# Patient Record
Sex: Female | Born: 1960 | Race: Black or African American | Hispanic: No | Marital: Single | Smoking: Former smoker
Health system: Southern US, Community
[De-identification: ages and names within clinical notes are randomized; demographics above are authoritative.]

## PROBLEM LIST (undated history)

## (undated) DIAGNOSIS — R011 Cardiac murmur, unspecified: Secondary | ICD-10-CM

## (undated) DIAGNOSIS — I499 Cardiac arrhythmia, unspecified: Secondary | ICD-10-CM

## (undated) DIAGNOSIS — K219 Gastro-esophageal reflux disease without esophagitis: Secondary | ICD-10-CM

## (undated) DIAGNOSIS — R0600 Dyspnea, unspecified: Secondary | ICD-10-CM

## (undated) DIAGNOSIS — G4730 Sleep apnea, unspecified: Secondary | ICD-10-CM

## (undated) DIAGNOSIS — M797 Fibromyalgia: Secondary | ICD-10-CM

## (undated) DIAGNOSIS — F419 Anxiety disorder, unspecified: Secondary | ICD-10-CM

## (undated) DIAGNOSIS — I10 Essential (primary) hypertension: Secondary | ICD-10-CM

## (undated) DIAGNOSIS — J383 Other diseases of vocal cords: Secondary | ICD-10-CM

## (undated) DIAGNOSIS — Z9289 Personal history of other medical treatment: Secondary | ICD-10-CM

## (undated) HISTORY — DX: Fibromyalgia: M79.7

## (undated) HISTORY — PX: KNEE SURGERY: SHX244

## (undated) HISTORY — PX: SKIN GRAFT: SHX250

## (undated) HISTORY — DX: Personal history of other medical treatment: Z92.89

## (undated) HISTORY — PX: APPENDECTOMY: SHX54

## (undated) HISTORY — DX: Cardiac arrhythmia, unspecified: I49.9

## (undated) HISTORY — PX: CARPAL TUNNEL RELEASE: SHX101

## (undated) HISTORY — DX: Essential (primary) hypertension: I10

## (undated) HISTORY — DX: Sleep apnea, unspecified: G47.30

## (undated) HISTORY — DX: Gastro-esophageal reflux disease without esophagitis: K21.9

## (undated) HISTORY — DX: Other diseases of vocal cords: J38.3

---

## 1999-01-31 ENCOUNTER — Emergency Department (HOSPITAL_COMMUNITY): Admission: EM | Admit: 1999-01-31 | Discharge: 1999-01-31 | Payer: Self-pay | Admitting: Emergency Medicine

## 1999-02-09 ENCOUNTER — Ambulatory Visit (HOSPITAL_COMMUNITY): Admission: RE | Admit: 1999-02-09 | Discharge: 1999-02-09 | Payer: Self-pay | Admitting: Family Medicine

## 1999-02-09 ENCOUNTER — Encounter: Payer: Self-pay | Admitting: Family Medicine

## 1999-08-11 ENCOUNTER — Emergency Department (HOSPITAL_COMMUNITY): Admission: EM | Admit: 1999-08-11 | Discharge: 1999-08-11 | Payer: Self-pay | Admitting: Emergency Medicine

## 2000-04-14 ENCOUNTER — Emergency Department (HOSPITAL_COMMUNITY): Admission: EM | Admit: 2000-04-14 | Discharge: 2000-04-14 | Payer: Self-pay | Admitting: Emergency Medicine

## 2001-01-17 ENCOUNTER — Emergency Department (HOSPITAL_COMMUNITY): Admission: EM | Admit: 2001-01-17 | Discharge: 2001-01-17 | Payer: Self-pay | Admitting: Internal Medicine

## 2001-04-23 ENCOUNTER — Inpatient Hospital Stay (HOSPITAL_COMMUNITY): Admission: EM | Admit: 2001-04-23 | Discharge: 2001-04-25 | Payer: Self-pay | Admitting: Emergency Medicine

## 2001-04-25 ENCOUNTER — Encounter: Payer: Self-pay | Admitting: Family Medicine

## 2001-07-05 HISTORY — PX: SHOULDER SURGERY: SHX246

## 2001-09-18 ENCOUNTER — Encounter: Admission: RE | Admit: 2001-09-18 | Discharge: 2001-09-18 | Payer: Self-pay | Admitting: Family Medicine

## 2001-09-18 ENCOUNTER — Encounter: Payer: Self-pay | Admitting: Family Medicine

## 2001-09-25 ENCOUNTER — Emergency Department (HOSPITAL_COMMUNITY): Admission: EM | Admit: 2001-09-25 | Discharge: 2001-09-25 | Payer: Self-pay | Admitting: Emergency Medicine

## 2002-05-03 ENCOUNTER — Emergency Department (HOSPITAL_COMMUNITY): Admission: EM | Admit: 2002-05-03 | Discharge: 2002-05-03 | Payer: Self-pay | Admitting: Emergency Medicine

## 2002-05-03 ENCOUNTER — Ambulatory Visit: Admission: RE | Admit: 2002-05-03 | Discharge: 2002-05-03 | Payer: Self-pay | Admitting: Family Medicine

## 2002-07-27 ENCOUNTER — Encounter: Admission: RE | Admit: 2002-07-27 | Discharge: 2002-09-07 | Payer: Self-pay | Admitting: Orthopedic Surgery

## 2002-09-18 ENCOUNTER — Encounter: Admission: RE | Admit: 2002-09-18 | Discharge: 2002-10-05 | Payer: Self-pay | Admitting: Orthopedic Surgery

## 2002-10-26 ENCOUNTER — Other Ambulatory Visit: Admission: RE | Admit: 2002-10-26 | Discharge: 2002-10-26 | Payer: Self-pay | Admitting: Family Medicine

## 2003-01-27 ENCOUNTER — Emergency Department (HOSPITAL_COMMUNITY): Admission: EM | Admit: 2003-01-27 | Discharge: 2003-01-27 | Payer: Self-pay | Admitting: Emergency Medicine

## 2004-03-20 ENCOUNTER — Encounter: Admission: RE | Admit: 2004-03-20 | Discharge: 2004-06-18 | Payer: Self-pay | Admitting: Orthopedic Surgery

## 2004-06-17 ENCOUNTER — Emergency Department (HOSPITAL_COMMUNITY): Admission: EM | Admit: 2004-06-17 | Discharge: 2004-06-17 | Payer: Self-pay | Admitting: Emergency Medicine

## 2004-10-07 ENCOUNTER — Encounter: Admission: RE | Admit: 2004-10-07 | Discharge: 2004-10-07 | Payer: Self-pay | Admitting: Family Medicine

## 2005-03-19 ENCOUNTER — Ambulatory Visit (HOSPITAL_BASED_OUTPATIENT_CLINIC_OR_DEPARTMENT_OTHER): Admission: RE | Admit: 2005-03-19 | Discharge: 2005-03-19 | Payer: Self-pay | Admitting: Orthopedic Surgery

## 2005-03-19 ENCOUNTER — Ambulatory Visit (HOSPITAL_COMMUNITY): Admission: RE | Admit: 2005-03-19 | Discharge: 2005-03-19 | Payer: Self-pay | Admitting: Orthopedic Surgery

## 2005-04-07 ENCOUNTER — Encounter: Admission: RE | Admit: 2005-04-07 | Discharge: 2005-07-06 | Payer: Self-pay | Admitting: Orthopedic Surgery

## 2005-08-17 ENCOUNTER — Encounter: Admission: RE | Admit: 2005-08-17 | Discharge: 2005-11-15 | Payer: Self-pay | Admitting: Orthopedic Surgery

## 2005-11-25 ENCOUNTER — Encounter: Admission: RE | Admit: 2005-11-25 | Discharge: 2006-02-23 | Payer: Self-pay | Admitting: Orthopedic Surgery

## 2006-05-15 ENCOUNTER — Ambulatory Visit (HOSPITAL_COMMUNITY): Admission: RE | Admit: 2006-05-15 | Discharge: 2006-05-15 | Payer: Self-pay | Admitting: Orthopedic Surgery

## 2006-06-17 ENCOUNTER — Ambulatory Visit (HOSPITAL_COMMUNITY): Admission: RE | Admit: 2006-06-17 | Discharge: 2006-06-17 | Payer: Self-pay | Admitting: Orthopedic Surgery

## 2006-06-17 ENCOUNTER — Encounter: Payer: Self-pay | Admitting: Vascular Surgery

## 2006-12-02 ENCOUNTER — Encounter: Admission: RE | Admit: 2006-12-02 | Discharge: 2006-12-02 | Payer: Self-pay | Admitting: Rheumatology

## 2006-12-06 ENCOUNTER — Ambulatory Visit: Payer: Self-pay | Admitting: Pulmonary Disease

## 2007-01-09 ENCOUNTER — Ambulatory Visit: Payer: Self-pay | Admitting: Physical Medicine & Rehabilitation

## 2007-01-09 ENCOUNTER — Encounter
Admission: RE | Admit: 2007-01-09 | Discharge: 2007-04-09 | Payer: Self-pay | Admitting: Physical Medicine & Rehabilitation

## 2007-01-18 ENCOUNTER — Encounter: Admission: RE | Admit: 2007-01-18 | Discharge: 2007-01-18 | Payer: Self-pay | Admitting: Family Medicine

## 2007-02-02 ENCOUNTER — Encounter
Admission: RE | Admit: 2007-02-02 | Discharge: 2007-05-03 | Payer: Self-pay | Admitting: Physical Medicine & Rehabilitation

## 2007-03-03 ENCOUNTER — Ambulatory Visit: Payer: Self-pay | Admitting: Physical Medicine & Rehabilitation

## 2007-05-04 ENCOUNTER — Ambulatory Visit: Payer: Self-pay | Admitting: Psychology

## 2007-05-04 ENCOUNTER — Ambulatory Visit: Payer: Self-pay | Admitting: Physical Medicine & Rehabilitation

## 2007-05-04 ENCOUNTER — Encounter
Admission: RE | Admit: 2007-05-04 | Discharge: 2007-08-02 | Payer: Self-pay | Admitting: Physical Medicine & Rehabilitation

## 2007-06-08 ENCOUNTER — Ambulatory Visit: Payer: Self-pay | Admitting: Physical Medicine & Rehabilitation

## 2007-07-03 ENCOUNTER — Encounter
Admission: RE | Admit: 2007-07-03 | Discharge: 2007-07-04 | Payer: Self-pay | Admitting: Physical Medicine & Rehabilitation

## 2007-07-06 ENCOUNTER — Encounter
Admission: RE | Admit: 2007-07-06 | Discharge: 2007-08-14 | Payer: Self-pay | Admitting: Physical Medicine & Rehabilitation

## 2007-07-10 ENCOUNTER — Encounter
Admission: RE | Admit: 2007-07-10 | Discharge: 2007-09-14 | Payer: Self-pay | Admitting: Physical Medicine & Rehabilitation

## 2007-07-10 ENCOUNTER — Ambulatory Visit: Payer: Self-pay | Admitting: Physical Medicine & Rehabilitation

## 2007-07-29 ENCOUNTER — Encounter
Admission: RE | Admit: 2007-07-29 | Discharge: 2007-07-29 | Payer: Self-pay | Admitting: Physical Medicine & Rehabilitation

## 2007-07-31 DIAGNOSIS — R51 Headache: Secondary | ICD-10-CM | POA: Insufficient documentation

## 2007-07-31 DIAGNOSIS — I10 Essential (primary) hypertension: Secondary | ICD-10-CM | POA: Insufficient documentation

## 2007-07-31 DIAGNOSIS — R519 Headache, unspecified: Secondary | ICD-10-CM | POA: Insufficient documentation

## 2007-07-31 DIAGNOSIS — I517 Cardiomegaly: Secondary | ICD-10-CM | POA: Insufficient documentation

## 2007-07-31 DIAGNOSIS — J4540 Moderate persistent asthma, uncomplicated: Secondary | ICD-10-CM | POA: Insufficient documentation

## 2007-08-09 ENCOUNTER — Ambulatory Visit: Payer: Self-pay | Admitting: Physical Medicine & Rehabilitation

## 2007-08-09 ENCOUNTER — Encounter
Admission: RE | Admit: 2007-08-09 | Discharge: 2007-11-07 | Payer: Self-pay | Admitting: Physical Medicine & Rehabilitation

## 2007-08-23 ENCOUNTER — Ambulatory Visit: Payer: Self-pay | Admitting: Pulmonary Disease

## 2007-08-23 DIAGNOSIS — J383 Other diseases of vocal cords: Secondary | ICD-10-CM | POA: Insufficient documentation

## 2007-08-23 DIAGNOSIS — R0602 Shortness of breath: Secondary | ICD-10-CM | POA: Insufficient documentation

## 2007-09-11 ENCOUNTER — Ambulatory Visit: Payer: Self-pay | Admitting: Physical Medicine & Rehabilitation

## 2007-09-26 ENCOUNTER — Ambulatory Visit: Payer: Self-pay | Admitting: Physical Medicine & Rehabilitation

## 2007-09-26 ENCOUNTER — Encounter
Admission: RE | Admit: 2007-09-26 | Discharge: 2007-09-28 | Payer: Self-pay | Admitting: Physical Medicine & Rehabilitation

## 2007-11-20 ENCOUNTER — Encounter
Admission: RE | Admit: 2007-11-20 | Discharge: 2007-12-20 | Payer: Self-pay | Admitting: Physical Medicine & Rehabilitation

## 2007-11-20 ENCOUNTER — Ambulatory Visit: Payer: Self-pay | Admitting: Physical Medicine & Rehabilitation

## 2007-12-19 ENCOUNTER — Encounter
Admission: RE | Admit: 2007-12-19 | Discharge: 2007-12-19 | Payer: Self-pay | Admitting: Physical Medicine & Rehabilitation

## 2007-12-20 ENCOUNTER — Ambulatory Visit: Payer: Self-pay | Admitting: Physical Medicine & Rehabilitation

## 2007-12-27 ENCOUNTER — Encounter: Payer: Self-pay | Admitting: Internal Medicine

## 2008-01-09 ENCOUNTER — Emergency Department (HOSPITAL_COMMUNITY): Admission: EM | Admit: 2008-01-09 | Discharge: 2008-01-09 | Payer: Self-pay | Admitting: Family Medicine

## 2008-01-22 ENCOUNTER — Emergency Department (HOSPITAL_COMMUNITY): Admission: EM | Admit: 2008-01-22 | Discharge: 2008-01-22 | Payer: Self-pay | Admitting: Emergency Medicine

## 2008-02-14 ENCOUNTER — Telehealth (INDEPENDENT_AMBULATORY_CARE_PROVIDER_SITE_OTHER): Payer: Self-pay | Admitting: *Deleted

## 2008-03-01 ENCOUNTER — Encounter
Admission: RE | Admit: 2008-03-01 | Discharge: 2008-03-04 | Payer: Self-pay | Admitting: Physical Medicine & Rehabilitation

## 2008-03-04 ENCOUNTER — Ambulatory Visit: Payer: Self-pay | Admitting: Physical Medicine & Rehabilitation

## 2008-03-27 ENCOUNTER — Encounter: Payer: Self-pay | Admitting: Pulmonary Disease

## 2008-04-01 ENCOUNTER — Telehealth (INDEPENDENT_AMBULATORY_CARE_PROVIDER_SITE_OTHER): Payer: Self-pay | Admitting: *Deleted

## 2008-04-03 ENCOUNTER — Ambulatory Visit: Payer: Self-pay | Admitting: Pulmonary Disease

## 2008-04-16 ENCOUNTER — Emergency Department (HOSPITAL_COMMUNITY): Admission: EM | Admit: 2008-04-16 | Discharge: 2008-04-16 | Payer: Self-pay | Admitting: Emergency Medicine

## 2008-06-03 ENCOUNTER — Ambulatory Visit: Payer: Self-pay | Admitting: Gastroenterology

## 2008-06-03 DIAGNOSIS — K5900 Constipation, unspecified: Secondary | ICD-10-CM | POA: Insufficient documentation

## 2008-06-03 DIAGNOSIS — IMO0001 Reserved for inherently not codable concepts without codable children: Secondary | ICD-10-CM | POA: Insufficient documentation

## 2008-06-03 DIAGNOSIS — R1310 Dysphagia, unspecified: Secondary | ICD-10-CM | POA: Insufficient documentation

## 2008-06-07 ENCOUNTER — Encounter: Payer: Self-pay | Admitting: Pulmonary Disease

## 2008-06-11 ENCOUNTER — Ambulatory Visit: Payer: Self-pay | Admitting: Gastroenterology

## 2008-08-06 ENCOUNTER — Telehealth: Payer: Self-pay | Admitting: Gastroenterology

## 2008-08-06 DIAGNOSIS — K222 Esophageal obstruction: Secondary | ICD-10-CM | POA: Insufficient documentation

## 2008-08-08 ENCOUNTER — Ambulatory Visit: Payer: Self-pay | Admitting: Gastroenterology

## 2008-08-08 ENCOUNTER — Telehealth (INDEPENDENT_AMBULATORY_CARE_PROVIDER_SITE_OTHER): Payer: Self-pay | Admitting: *Deleted

## 2008-08-13 ENCOUNTER — Telehealth: Payer: Self-pay | Admitting: Gastroenterology

## 2008-08-16 ENCOUNTER — Ambulatory Visit (HOSPITAL_COMMUNITY): Admission: RE | Admit: 2008-08-16 | Discharge: 2008-08-16 | Payer: Self-pay | Admitting: Gastroenterology

## 2008-08-16 ENCOUNTER — Ambulatory Visit: Payer: Self-pay | Admitting: Gastroenterology

## 2008-09-02 ENCOUNTER — Encounter: Payer: Self-pay | Admitting: Pulmonary Disease

## 2008-09-10 ENCOUNTER — Telehealth (INDEPENDENT_AMBULATORY_CARE_PROVIDER_SITE_OTHER): Payer: Self-pay | Admitting: *Deleted

## 2008-09-11 ENCOUNTER — Encounter: Payer: Self-pay | Admitting: Pulmonary Disease

## 2008-09-12 ENCOUNTER — Emergency Department (HOSPITAL_COMMUNITY): Admission: EM | Admit: 2008-09-12 | Discharge: 2008-09-13 | Payer: Self-pay | Admitting: Family Medicine

## 2008-09-30 ENCOUNTER — Ambulatory Visit: Payer: Self-pay | Admitting: Internal Medicine

## 2008-09-30 DIAGNOSIS — J309 Allergic rhinitis, unspecified: Secondary | ICD-10-CM | POA: Insufficient documentation

## 2008-09-30 LAB — CONVERTED CEMR LAB
Basophils Absolute: 0 10*3/uL (ref 0.0–0.1)
Eosinophils Absolute: 0.2 10*3/uL (ref 0.0–0.7)
HCT: 33.3 % — ABNORMAL LOW (ref 36.0–46.0)
IgE (Immunoglobulin E), Serum: 52.5 intl units/mL (ref 0.0–180.0)
Lymphocytes Relative: 36.7 % (ref 12.0–46.0)
Lymphs Abs: 5.2 10*3/uL — ABNORMAL HIGH (ref 0.7–4.0)
Monocytes Relative: 3.3 % (ref 3.0–12.0)
Neutro Abs: 8.3 10*3/uL — ABNORMAL HIGH (ref 1.4–7.7)
Platelets: 319 10*3/uL (ref 150.0–400.0)
RBC: 4.07 M/uL (ref 3.87–5.11)
WBC: 14.2 10*3/uL — ABNORMAL HIGH (ref 4.5–10.5)

## 2008-11-08 ENCOUNTER — Ambulatory Visit: Payer: Self-pay | Admitting: Internal Medicine

## 2008-11-08 ENCOUNTER — Telehealth (INDEPENDENT_AMBULATORY_CARE_PROVIDER_SITE_OTHER): Payer: Self-pay | Admitting: *Deleted

## 2008-11-08 ENCOUNTER — Encounter (INDEPENDENT_AMBULATORY_CARE_PROVIDER_SITE_OTHER): Payer: Self-pay | Admitting: *Deleted

## 2008-11-12 ENCOUNTER — Telehealth: Payer: Self-pay | Admitting: Internal Medicine

## 2008-11-17 ENCOUNTER — Encounter: Payer: Self-pay | Admitting: Internal Medicine

## 2008-11-17 DIAGNOSIS — K219 Gastro-esophageal reflux disease without esophagitis: Secondary | ICD-10-CM | POA: Insufficient documentation

## 2008-11-18 ENCOUNTER — Ambulatory Visit: Payer: Self-pay | Admitting: Internal Medicine

## 2008-11-19 ENCOUNTER — Ambulatory Visit: Payer: Self-pay | Admitting: Internal Medicine

## 2008-11-25 ENCOUNTER — Ambulatory Visit: Payer: Self-pay | Admitting: Internal Medicine

## 2008-11-27 ENCOUNTER — Telehealth (INDEPENDENT_AMBULATORY_CARE_PROVIDER_SITE_OTHER): Payer: Self-pay | Admitting: *Deleted

## 2008-11-28 ENCOUNTER — Ambulatory Visit: Payer: Self-pay | Admitting: Internal Medicine

## 2008-12-03 ENCOUNTER — Ambulatory Visit: Payer: Self-pay | Admitting: Internal Medicine

## 2008-12-06 ENCOUNTER — Ambulatory Visit: Payer: Self-pay | Admitting: Internal Medicine

## 2008-12-11 ENCOUNTER — Ambulatory Visit: Payer: Self-pay | Admitting: Internal Medicine

## 2008-12-13 ENCOUNTER — Ambulatory Visit: Payer: Self-pay | Admitting: Internal Medicine

## 2008-12-17 ENCOUNTER — Ambulatory Visit: Payer: Self-pay | Admitting: Internal Medicine

## 2008-12-20 ENCOUNTER — Ambulatory Visit: Payer: Self-pay | Admitting: Internal Medicine

## 2008-12-24 ENCOUNTER — Ambulatory Visit: Payer: Self-pay | Admitting: Internal Medicine

## 2008-12-25 ENCOUNTER — Telehealth (INDEPENDENT_AMBULATORY_CARE_PROVIDER_SITE_OTHER): Payer: Self-pay | Admitting: *Deleted

## 2008-12-26 ENCOUNTER — Telehealth: Payer: Self-pay | Admitting: Internal Medicine

## 2008-12-26 ENCOUNTER — Ambulatory Visit: Payer: Self-pay | Admitting: Internal Medicine

## 2008-12-30 ENCOUNTER — Telehealth (INDEPENDENT_AMBULATORY_CARE_PROVIDER_SITE_OTHER): Payer: Self-pay | Admitting: *Deleted

## 2008-12-30 ENCOUNTER — Ambulatory Visit: Payer: Self-pay | Admitting: Internal Medicine

## 2008-12-31 ENCOUNTER — Ambulatory Visit: Payer: Self-pay | Admitting: Gastroenterology

## 2009-01-02 ENCOUNTER — Ambulatory Visit (HOSPITAL_COMMUNITY): Admission: RE | Admit: 2009-01-02 | Discharge: 2009-01-02 | Payer: Self-pay | Admitting: Gastroenterology

## 2009-01-02 ENCOUNTER — Ambulatory Visit: Payer: Self-pay | Admitting: Internal Medicine

## 2009-01-07 ENCOUNTER — Ambulatory Visit: Payer: Self-pay | Admitting: Internal Medicine

## 2009-01-08 ENCOUNTER — Encounter: Payer: Self-pay | Admitting: Gastroenterology

## 2009-01-10 ENCOUNTER — Ambulatory Visit: Payer: Self-pay | Admitting: Internal Medicine

## 2009-01-14 ENCOUNTER — Ambulatory Visit: Payer: Self-pay | Admitting: Internal Medicine

## 2009-01-17 ENCOUNTER — Ambulatory Visit: Payer: Self-pay | Admitting: Internal Medicine

## 2009-01-22 ENCOUNTER — Ambulatory Visit: Payer: Self-pay | Admitting: Internal Medicine

## 2009-01-24 ENCOUNTER — Ambulatory Visit: Payer: Self-pay | Admitting: Internal Medicine

## 2009-01-27 ENCOUNTER — Ambulatory Visit: Payer: Self-pay | Admitting: Internal Medicine

## 2009-01-27 ENCOUNTER — Telehealth: Payer: Self-pay | Admitting: Internal Medicine

## 2009-01-28 ENCOUNTER — Ambulatory Visit: Payer: Self-pay | Admitting: Internal Medicine

## 2009-01-31 ENCOUNTER — Ambulatory Visit: Payer: Self-pay | Admitting: Internal Medicine

## 2009-02-04 ENCOUNTER — Ambulatory Visit: Payer: Self-pay | Admitting: Internal Medicine

## 2009-02-07 ENCOUNTER — Ambulatory Visit: Payer: Self-pay | Admitting: Internal Medicine

## 2009-02-10 ENCOUNTER — Ambulatory Visit (HOSPITAL_COMMUNITY): Admission: RE | Admit: 2009-02-10 | Discharge: 2009-02-10 | Payer: Self-pay | Admitting: Gastroenterology

## 2009-02-10 ENCOUNTER — Ambulatory Visit: Payer: Self-pay | Admitting: Gastroenterology

## 2009-02-12 ENCOUNTER — Ambulatory Visit: Payer: Self-pay | Admitting: Internal Medicine

## 2009-02-14 ENCOUNTER — Telehealth (INDEPENDENT_AMBULATORY_CARE_PROVIDER_SITE_OTHER): Payer: Self-pay | Admitting: *Deleted

## 2009-02-19 ENCOUNTER — Ambulatory Visit: Payer: Self-pay | Admitting: Internal Medicine

## 2009-02-20 ENCOUNTER — Telehealth: Payer: Self-pay | Admitting: Gastroenterology

## 2009-02-21 ENCOUNTER — Ambulatory Visit: Payer: Self-pay | Admitting: Internal Medicine

## 2009-02-24 ENCOUNTER — Ambulatory Visit: Payer: Self-pay | Admitting: Internal Medicine

## 2009-02-28 ENCOUNTER — Ambulatory Visit: Payer: Self-pay | Admitting: Internal Medicine

## 2009-03-03 ENCOUNTER — Ambulatory Visit: Payer: Self-pay | Admitting: Internal Medicine

## 2009-03-07 ENCOUNTER — Ambulatory Visit: Payer: Self-pay | Admitting: Internal Medicine

## 2009-03-13 ENCOUNTER — Encounter: Admission: RE | Admit: 2009-03-13 | Discharge: 2009-03-13 | Payer: Self-pay | Admitting: Orthopedic Surgery

## 2009-03-14 ENCOUNTER — Ambulatory Visit: Payer: Self-pay | Admitting: Internal Medicine

## 2009-03-18 ENCOUNTER — Telehealth (INDEPENDENT_AMBULATORY_CARE_PROVIDER_SITE_OTHER): Payer: Self-pay | Admitting: *Deleted

## 2009-03-21 ENCOUNTER — Ambulatory Visit: Payer: Self-pay | Admitting: Internal Medicine

## 2009-03-25 ENCOUNTER — Ambulatory Visit: Payer: Self-pay | Admitting: Internal Medicine

## 2009-03-27 ENCOUNTER — Telehealth: Payer: Self-pay | Admitting: Internal Medicine

## 2009-03-27 ENCOUNTER — Ambulatory Visit: Payer: Self-pay | Admitting: Internal Medicine

## 2009-03-27 ENCOUNTER — Encounter (INDEPENDENT_AMBULATORY_CARE_PROVIDER_SITE_OTHER): Payer: Self-pay | Admitting: *Deleted

## 2009-03-27 ENCOUNTER — Encounter: Payer: Self-pay | Admitting: Internal Medicine

## 2009-03-28 ENCOUNTER — Telehealth: Payer: Self-pay | Admitting: Internal Medicine

## 2009-04-01 ENCOUNTER — Ambulatory Visit: Payer: Self-pay | Admitting: Internal Medicine

## 2009-04-04 ENCOUNTER — Ambulatory Visit: Payer: Self-pay | Admitting: Internal Medicine

## 2009-04-04 ENCOUNTER — Telehealth (INDEPENDENT_AMBULATORY_CARE_PROVIDER_SITE_OTHER): Payer: Self-pay | Admitting: *Deleted

## 2009-04-10 ENCOUNTER — Ambulatory Visit: Payer: Self-pay | Admitting: Internal Medicine

## 2009-04-16 ENCOUNTER — Ambulatory Visit: Payer: Self-pay | Admitting: Internal Medicine

## 2009-04-17 ENCOUNTER — Ambulatory Visit: Payer: Self-pay | Admitting: Internal Medicine

## 2009-04-18 ENCOUNTER — Ambulatory Visit: Payer: Self-pay | Admitting: Internal Medicine

## 2009-04-21 ENCOUNTER — Telehealth: Payer: Self-pay | Admitting: Internal Medicine

## 2009-04-22 ENCOUNTER — Ambulatory Visit: Payer: Self-pay | Admitting: Internal Medicine

## 2009-04-24 ENCOUNTER — Telehealth (INDEPENDENT_AMBULATORY_CARE_PROVIDER_SITE_OTHER): Payer: Self-pay | Admitting: *Deleted

## 2009-04-24 ENCOUNTER — Ambulatory Visit: Payer: Self-pay | Admitting: Internal Medicine

## 2009-05-01 ENCOUNTER — Ambulatory Visit: Payer: Self-pay | Admitting: Internal Medicine

## 2009-05-06 ENCOUNTER — Encounter: Admission: RE | Admit: 2009-05-06 | Discharge: 2009-05-06 | Payer: Self-pay | Admitting: Orthopedic Surgery

## 2009-05-07 ENCOUNTER — Encounter: Admission: RE | Admit: 2009-05-07 | Discharge: 2009-05-07 | Payer: Self-pay | Admitting: Orthopedic Surgery

## 2009-05-09 ENCOUNTER — Ambulatory Visit: Payer: Self-pay | Admitting: Internal Medicine

## 2009-05-21 ENCOUNTER — Emergency Department (HOSPITAL_COMMUNITY): Admission: EM | Admit: 2009-05-21 | Discharge: 2009-05-21 | Payer: Self-pay | Admitting: Emergency Medicine

## 2009-05-22 ENCOUNTER — Ambulatory Visit: Payer: Self-pay | Admitting: Internal Medicine

## 2009-05-27 ENCOUNTER — Encounter: Admission: RE | Admit: 2009-05-27 | Discharge: 2009-05-27 | Payer: Self-pay | Admitting: Orthopedic Surgery

## 2009-06-05 ENCOUNTER — Telehealth (INDEPENDENT_AMBULATORY_CARE_PROVIDER_SITE_OTHER): Payer: Self-pay | Admitting: *Deleted

## 2009-06-05 ENCOUNTER — Ambulatory Visit: Payer: Self-pay | Admitting: Internal Medicine

## 2009-06-06 ENCOUNTER — Telehealth (INDEPENDENT_AMBULATORY_CARE_PROVIDER_SITE_OTHER): Payer: Self-pay | Admitting: *Deleted

## 2009-06-20 ENCOUNTER — Ambulatory Visit: Payer: Self-pay | Admitting: Internal Medicine

## 2009-06-30 ENCOUNTER — Ambulatory Visit: Payer: Self-pay | Admitting: Internal Medicine

## 2009-06-30 ENCOUNTER — Telehealth (INDEPENDENT_AMBULATORY_CARE_PROVIDER_SITE_OTHER): Payer: Self-pay | Admitting: *Deleted

## 2009-07-03 ENCOUNTER — Encounter: Payer: Self-pay | Admitting: Internal Medicine

## 2009-07-07 ENCOUNTER — Ambulatory Visit: Payer: Self-pay | Admitting: Internal Medicine

## 2009-07-16 ENCOUNTER — Ambulatory Visit: Payer: Self-pay | Admitting: Internal Medicine

## 2009-07-16 ENCOUNTER — Telehealth: Payer: Self-pay | Admitting: Internal Medicine

## 2009-08-01 ENCOUNTER — Telehealth (INDEPENDENT_AMBULATORY_CARE_PROVIDER_SITE_OTHER): Payer: Self-pay | Admitting: *Deleted

## 2009-08-04 ENCOUNTER — Inpatient Hospital Stay (HOSPITAL_COMMUNITY): Admission: RE | Admit: 2009-08-04 | Discharge: 2009-08-06 | Payer: Self-pay | Admitting: Orthopedic Surgery

## 2009-08-20 ENCOUNTER — Telehealth (INDEPENDENT_AMBULATORY_CARE_PROVIDER_SITE_OTHER): Payer: Self-pay | Admitting: *Deleted

## 2009-08-21 ENCOUNTER — Ambulatory Visit: Payer: Self-pay | Admitting: Internal Medicine

## 2009-08-22 ENCOUNTER — Ambulatory Visit: Payer: Self-pay | Admitting: Internal Medicine

## 2009-08-29 ENCOUNTER — Ambulatory Visit: Payer: Self-pay | Admitting: Internal Medicine

## 2009-08-31 ENCOUNTER — Emergency Department (HOSPITAL_COMMUNITY): Admission: EM | Admit: 2009-08-31 | Discharge: 2009-08-31 | Payer: Self-pay | Admitting: Family Medicine

## 2009-09-12 ENCOUNTER — Telehealth (INDEPENDENT_AMBULATORY_CARE_PROVIDER_SITE_OTHER): Payer: Self-pay | Admitting: *Deleted

## 2009-09-12 ENCOUNTER — Ambulatory Visit: Payer: Self-pay | Admitting: Internal Medicine

## 2009-09-16 ENCOUNTER — Encounter: Admission: RE | Admit: 2009-09-16 | Discharge: 2009-11-24 | Payer: Self-pay | Admitting: Orthopedic Surgery

## 2009-09-17 ENCOUNTER — Telehealth: Payer: Self-pay | Admitting: Internal Medicine

## 2009-09-18 ENCOUNTER — Encounter: Payer: Self-pay | Admitting: Internal Medicine

## 2009-09-19 ENCOUNTER — Ambulatory Visit: Payer: Self-pay | Admitting: Internal Medicine

## 2009-10-02 ENCOUNTER — Telehealth (INDEPENDENT_AMBULATORY_CARE_PROVIDER_SITE_OTHER): Payer: Self-pay | Admitting: *Deleted

## 2009-10-03 ENCOUNTER — Ambulatory Visit: Payer: Self-pay | Admitting: Internal Medicine

## 2009-10-17 ENCOUNTER — Ambulatory Visit: Payer: Self-pay | Admitting: Internal Medicine

## 2009-10-27 ENCOUNTER — Encounter: Admission: RE | Admit: 2009-10-27 | Discharge: 2009-11-24 | Payer: Self-pay | Admitting: Orthopedic Surgery

## 2009-10-31 ENCOUNTER — Ambulatory Visit: Payer: Self-pay | Admitting: Internal Medicine

## 2009-11-07 ENCOUNTER — Ambulatory Visit: Payer: Self-pay | Admitting: Internal Medicine

## 2009-11-14 ENCOUNTER — Ambulatory Visit: Payer: Self-pay | Admitting: Internal Medicine

## 2009-11-18 ENCOUNTER — Ambulatory Visit: Payer: Self-pay | Admitting: Internal Medicine

## 2009-11-28 ENCOUNTER — Ambulatory Visit: Payer: Self-pay | Admitting: Internal Medicine

## 2009-12-08 ENCOUNTER — Ambulatory Visit: Payer: Self-pay | Admitting: Internal Medicine

## 2009-12-12 ENCOUNTER — Ambulatory Visit: Payer: Self-pay | Admitting: Internal Medicine

## 2009-12-15 ENCOUNTER — Telehealth (INDEPENDENT_AMBULATORY_CARE_PROVIDER_SITE_OTHER): Payer: Self-pay | Admitting: *Deleted

## 2009-12-19 ENCOUNTER — Ambulatory Visit: Payer: Self-pay | Admitting: Internal Medicine

## 2009-12-25 ENCOUNTER — Telehealth (INDEPENDENT_AMBULATORY_CARE_PROVIDER_SITE_OTHER): Payer: Self-pay | Admitting: *Deleted

## 2009-12-26 ENCOUNTER — Ambulatory Visit: Payer: Self-pay | Admitting: Internal Medicine

## 2010-01-02 ENCOUNTER — Ambulatory Visit: Payer: Self-pay | Admitting: Internal Medicine

## 2010-01-09 ENCOUNTER — Ambulatory Visit: Payer: Self-pay | Admitting: Internal Medicine

## 2010-01-23 ENCOUNTER — Ambulatory Visit: Payer: Self-pay | Admitting: Internal Medicine

## 2010-01-27 ENCOUNTER — Ambulatory Visit: Payer: Self-pay | Admitting: Internal Medicine

## 2010-02-04 ENCOUNTER — Encounter: Admission: RE | Admit: 2010-02-04 | Discharge: 2010-02-23 | Payer: Self-pay | Admitting: Orthopedic Surgery

## 2010-02-05 ENCOUNTER — Telehealth (INDEPENDENT_AMBULATORY_CARE_PROVIDER_SITE_OTHER): Payer: Self-pay | Admitting: *Deleted

## 2010-02-06 ENCOUNTER — Ambulatory Visit: Payer: Self-pay | Admitting: Internal Medicine

## 2010-02-13 ENCOUNTER — Ambulatory Visit: Payer: Self-pay | Admitting: Internal Medicine

## 2010-02-14 ENCOUNTER — Encounter: Admission: RE | Admit: 2010-02-14 | Discharge: 2010-02-14 | Payer: Self-pay | Admitting: Orthopedic Surgery

## 2010-02-20 ENCOUNTER — Emergency Department (HOSPITAL_COMMUNITY): Admission: EM | Admit: 2010-02-20 | Discharge: 2010-02-20 | Payer: Self-pay | Admitting: Emergency Medicine

## 2010-03-06 ENCOUNTER — Ambulatory Visit: Payer: Self-pay | Admitting: Internal Medicine

## 2010-03-13 ENCOUNTER — Ambulatory Visit: Payer: Self-pay | Admitting: Internal Medicine

## 2010-03-20 ENCOUNTER — Ambulatory Visit: Payer: Self-pay | Admitting: Internal Medicine

## 2010-03-27 ENCOUNTER — Ambulatory Visit: Payer: Self-pay | Admitting: Internal Medicine

## 2010-04-03 ENCOUNTER — Ambulatory Visit: Payer: Self-pay | Admitting: Internal Medicine

## 2010-04-17 ENCOUNTER — Ambulatory Visit: Payer: Self-pay | Admitting: Internal Medicine

## 2010-04-24 ENCOUNTER — Ambulatory Visit: Payer: Self-pay | Admitting: Internal Medicine

## 2010-04-28 ENCOUNTER — Telehealth: Payer: Self-pay | Admitting: Internal Medicine

## 2010-05-14 ENCOUNTER — Ambulatory Visit: Payer: Self-pay | Admitting: Internal Medicine

## 2010-05-22 ENCOUNTER — Ambulatory Visit: Payer: Self-pay | Admitting: Internal Medicine

## 2010-05-27 ENCOUNTER — Ambulatory Visit: Payer: Self-pay | Admitting: Internal Medicine

## 2010-06-19 ENCOUNTER — Ambulatory Visit: Payer: Self-pay | Admitting: Internal Medicine

## 2010-06-23 ENCOUNTER — Ambulatory Visit: Payer: Self-pay | Admitting: Internal Medicine

## 2010-07-20 ENCOUNTER — Ambulatory Visit: Payer: Self-pay | Admitting: Internal Medicine

## 2010-07-26 ENCOUNTER — Encounter: Payer: Self-pay | Admitting: Orthopedic Surgery

## 2010-07-26 ENCOUNTER — Encounter: Payer: Self-pay | Admitting: Physical Medicine & Rehabilitation

## 2010-08-04 NOTE — Miscellaneous (Signed)
Summary: Injection Record/[facility]  Injection Record/[facility]   Imported By: Sherian Rein 11/25/2009 09:05:46  _____________________________________________________________________  External Attachment:    Type:   Image     Comment:   External Document

## 2010-08-04 NOTE — Progress Notes (Signed)
Summary: refills  Phone Note Call from Patient Call back at [phone]   Caller: Patient Call For: young Summary of Call: refills: singulair 10 mg, ventolin hfa 108, and prednisone 10mg , pt said when she went to [facility] pharmacy had no record of these been [facility]ill. [facility] Initial call taken by: Darletta Moll,  September 17, 2009 11:00 AM  Follow-up for Phone Call        [facility] not [facility] so I called in singulair, ventolin and prednisone as listed in prev phone note. pt aware rx are now at the pharmacy.Carron Curie CMA  September 17, 2009 11:19 AM      Appended Document: refills received prior auth for the singulair 10mg  daily---called to get this approved through [facility]  [phone] and this med has been approved from 09-18-2009 until 09-18-2010.  [facility] [address] [facility]

## 2010-08-04 NOTE — Progress Notes (Signed)
Summary: rx-lmtcb x 2  Phone Note Call from Patient Call back at [phone]   Caller: Patient Call For: young Reason for Call: Talk to Nurse Summary of Call: can't keep asthma under control - walking, cleaning she gets sob - out of singulair and Ventolin.  Will you call her in rx for xanex for anxiety, doesn't know where her pcp is. [facility] [address]  Initial call taken by: Eugene Gavia,  September 12, 2009 10:53 AM  Follow-up for Phone Call        [facility].Carron Curie CMA  September 12, 2009 12:17 PM ATC home number, no answer, no voicemail.Carron Curie CMA  September 15, 2009 10:21 AM  spoke with pt and she c/o increased SOB with activity and dry cough x 3 weeks. Pt states she only feels better after a neb treatment.  She has been out of singulair and ventolin and request refill on these meds. I have sent refill for ventolin. Singulair is not on pt med list. Please advise if ok to send rx for singulair as well as any other recs. Carron Curie CMA  September 15, 2009 10:36 AM allergies: lyrica, carisopidol   Additional Follow-up for Phone Call Additional follow up Details #1::        I added Singulair back to her list Would also offer prednisone 10 mg, # 20 1 tab four times daily x 2 days, 3 times daily x 2 days, 2 times daily x 2 days, 1 time daily x 2 days  Additional Follow-up by: Waymon Budge MD,  September 15, 2009 12:46 PM    Additional Follow-up for Phone Call Additional follow up Details #2::    Rxs for prednisone, singulair, and ventolin sent to pharm.  LMOMTCB Vernie Murders  September 15, 2009 1:38 PM  [facility] Informing pt rx sent to pharmacy.  Aundra Millet Reynolds LPN  September 16, 2009 3:52 PM    New/Updated Medications: SINGULAIR 10 MG TABS (MONTELUKAST SODIUM) 1 daily for asthma PREDNISONE 10 MG TABS (PREDNISONE) 1 four times a day x 2 days, 1 three times a day x 2 days, 1 two times a day x 2 days, and 1 once daily x 2 days Prescriptions: VENTOLIN HFA 108 (90 BASE) MCG/ACT AERS  (ALBUTEROL SULFATE) 2 puffs every 4 hrs as needed for shortness of breath  #1 x 3   Entered by:   Vernie Murders   Authorized by:   Waymon Budge MD   Signed by:   Vernie Murders on 09/15/2009   Method used:   [facility]ically to        [facility] [address] [phone]* (retail)       [address]       [place], [place]  [address]       Ph: [phone] or [phone]       Fax: [phone]   RxID:   [id number] SINGULAIR 10 MG TABS (MONTELUKAST SODIUM) 1 daily for asthma  #30 x 11   Entered by:   Vernie Murders   Authorized by:   Waymon Budge MD   Signed by:   Vernie Murders on 09/15/2009   Method used:   Electronically to        [facility] [address] [phone]* (retail)       [address]       [place], [place]  [address]  Ph: [phone] or [phone]       Fax: [phone]   RxID:   [id number] PREDNISONE 10 MG TABS (PREDNISONE) 1 four times a day x 2 days, 1 three times a day x 2 days, 1 two times a day x 2 days, and 1 once daily x 2 days  #20 x 0   Entered by:   Vernie Murders   Authorized by:   Waymon Budge MD   Signed by:   Vernie Murders on 09/15/2009   Method used:   Electronically to        [facility] [address] [phone]* (retail)       [address]       [place], [place]  [address]       Ph: [phone] or [phone]       Fax: [phone]   RxID:   [id number] SINGULAIR 10 MG TABS (MONTELUKAST SODIUM) 1 daily for asthma  #30 x prn   Entered by:   Waymon Budge MD   Authorized by:   Pulmonary Triage   Signed by:   Waymon Budge MD on 09/15/2009   Method used:   Historical   RxID:   [id number]

## 2010-08-04 NOTE — Progress Notes (Signed)
Summary: returned call-LMTCB  Phone Note Call from Patient   Caller: Patient Call For: young Summary of Call: pt states that she has received "several missed calls" from our office. didn't know what it was regarding. call [phone] Initial call taken by: Tivis Ringer, CNA,  October 02, 2009 12:51 PM  Follow-up for Phone Call        Do not see any recent messages in EMR where we are trying to reach this pt.  Will forward to [place].  Katie, have you or CY been trying to reach this pt?  Gweneth Dimitri RN  October 02, 2009 1:47 PM   Additional Follow-up for Phone Call Additional follow up Details #1::        I have not; please check with allergy.Reynaldo Minium CMA  October 02, 2009 1:50 PM   Will forward to Allergy-pls advise if you have been trying to reach pt.  Gweneth Dimitri RN  October 02, 2009 2:14 PM     Additional Follow-up for Phone Call Additional follow up Details #2::    No we haven't tried to call her either. Follow-up by: Dimas Millin,  October 02, 2009 3:09 PM  Additional Follow-up for Phone Call Additional follow up Details #3:: Details for Additional Follow-up Action Taken: [facility] to advise pt no one called from this office. Carron Curie CMA  October 02, 2009 3:12 PM   [facility] informing pt no one from this office called pt. Aundra Millet Reynolds LPN  October 03, 1189 10:03 AM
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
## 2010-08-04 NOTE — Assessment & Plan Note (Signed)
Summary: flu shot/mh  Nurse Visit   Allergies: 1)  ! * Lyrica 2)  ! * "carisopidol"  Immunizations Administered:  Pneumonia Vaccine:    Vaccine Type: Pneumovax    Site: right deltoid    Mfr: Merck    Dose: 0.5 ml    Route: IM    Given by: Tammy Scott    Exp. Date: 06/19/2010    Lot #: [id number]  Orders Added: 1)  Pneumococcal Vaccine [90732] 2)  Admin 1st Vaccine [90471]  Appended Document: flu shot/mh Pt. came in Fri. needing allergy,pneumonia,& flu shots. I gave her all. shot in her lt.arm & her pneu. in her rt.arm. I ask her before I her gave the pneumonia shot "Does Dr.Young want you to have this? "Yes".I told her we would give her her flu shot next time she comes in. T.D. knows about this.

## 2010-08-04 NOTE — Progress Notes (Signed)
Summary: needs an rx/cb  Phone Note Call from Patient Call back at [phone]   Caller: Patient Call For: young Summary of Call: pt is wanting promethazine for nausea they cant [facility] doctor [facility] [address] Initial call taken by: Lacinda Axon,  December 25, 2009 12:12 PM  Follow-up for Phone Call        pt was just seen by CY on 12-12-2009.  Pt requesting  rx for promethazine for nause which pt states she has had for " a long time."  Pt states the nausea is sometimes brought on by food but other times it just comes and goes.  Pt states she usually gets this filled through Dr. Eugenie Filler but states the pharmacy has been unable to locate Dr. Thea Silversmith. Pt states he has "left the practice and is in some trouble."  Pt wanted to know if Cy will send order for script for promethazine for pt to take in the meantime since she cant get refils through Dr. Thea Silversmith.  Please advise.  Aundra Millet Reynolds LPN  December 25, 2009 1:09 PM   Additional Follow-up for Phone Call Additional follow up Details #1::        No i'm sorry but i can't manage for that problem. Short -trem, she may need to go to an urgent care. For the long term, she needs to find another primary doctor or we can refer to GI. Additional Follow-up by: Waymon Budge MD,  December 25, 2009 1:30 PM    Additional Follow-up for Phone Call Additional follow up Details #2::    Spoke with pt.  Pt informed of above statement per CY-she verbalized understanding, is ok with this, and voiced no futher questions.  Gweneth Dimitri RN  December 25, 2009 1:47 PM

## 2010-08-04 NOTE — Medication Information (Signed)
Summary: Tax adviser   Imported By: Lehman Prom 09/18/2009 16:41:03  _____________________________________________________________________  External Attachment:    Type:   Image     Comment:   External Document

## 2010-08-04 NOTE — Progress Notes (Signed)
Summary: talk to nurse  Phone Note Call from Patient Call back at [phone]   Caller: Patient Call For: young Summary of Call: pt is ready to restart allergy shots pls call and let her know when she need to come Initial call taken by: Rickard Patience,  August 20, 2009 3:21 PM  Follow-up for Phone Call        pt is ready to restart allergy shots. she postponed them due to surgery. please advise. Carron Curie CMA  August 20, 2009 3:25 PM   Additional Follow-up for Phone Call Additional follow up Details #1::        We should probably take her  back to 0.1 ml/vial at 1:5000 to restart and rebuild as tolerated. Additional Follow-up by: Waymon Budge MD,  August 21, 2009 1:17 PM

## 2010-08-04 NOTE — Progress Notes (Signed)
Summary: allergy shots  Phone Note Call from Patient   Caller: Patient Call For: young Summary of Call: pt wants to cancel her allergy shots until after her surgery. mond 08/04/09. pt # [id number] Initial call taken by: Tivis Ringer, CNA,  August 01, 2009 4:03 PM  Follow-up for Phone Call        called and spoke with pt and she stated that she is having surgery on monday and will wait until she is better to start back on the allergy shots.  FYI for CY Randell Loop CMA  August 01, 2009 4:08 PM   Additional Follow-up for Phone Call Additional follow up Details #1::        Noted- we will need to step her back appropriately at restart, depending on how long she is off her shots. Additional Follow-up by: Waymon Budge MD,  August 01, 2009 5:36 PM

## 2010-08-04 NOTE — Miscellaneous (Signed)
Summary: Injection Orders / [facility]    Injection Orders / [facility]    Imported By: Lennie Odor 12/02/2009 15:58:07  _____________________________________________________________________  External Attachment:    Type:   Image     Comment:   External Document

## 2010-08-04 NOTE — Progress Notes (Signed)
Summary: rx-LMTCB =  Phone Note Call from Patient Call back at [phone]   Caller: Patient Call For: young Reason for Call: Talk to Nurse Summary of Call: 1) pt needs a rescue inhaler, symbicort not helping her regain her breath. 2) Referral to someone for feet swelling and pain. 3) something for pain, other than Trammadol. [facility] [address] Initial call taken by: Eugene Gavia,  July 16, 2009 12:16 PM  Follow-up for Phone Call        CDY; please advise on these requests. Thanks. Reynaldo Minium CMA  July 16, 2009 12:25 PM   Additional Follow-up for Phone Call Additional follow up Details #1::        OK to give Proair rescue inhaler: 2 puffs four times a day as needed.--pt already had ventolin hfa on file so i sent refill for this  She should see her primary MD about swelling feet and pain issues.  lmtcb  Philipp Deputy [facility]  July 16, 2009 4:27 PM  Additional Follow-up by: Waymon Budge MD,  July 16, 2009 1:26 PM    Additional Follow-up for Phone Call Additional follow up Details #2::    LMOM TCB. Boone Master CNA  July 17, 2009 11:43 AM   LMTCBx3 . Carron Curie CMA  July 18, 2009 3:19 PM  pt advised of CY recs. Carron Curie CMA  July 18, 2009 3:57 PM     Prescriptions: VENTOLIN HFA 108 (90 BASE) MCG/ACT AERS (ALBUTEROL SULFATE) 2 puffs every 4 hrs as needed for shortness of breath  #1 x 11   Entered by:   Philipp Deputy CMA   Authorized by:   Waymon Budge MD   Signed by:   Philipp Deputy CMA on 07/16/2009   Method used:   [facility]        [facility]  [address] [phone]* (retail)       [address]       [place], [place]  [address]       Ph: [phone] or [phone]       Fax: [phone]   RxID:   [id number]

## 2010-08-04 NOTE — Progress Notes (Signed)
  Phone Note From Pharmacy   [facility] [address] Summary of Call: Received prior auth form for Nasonex. Initial call taken by: Abigail Miyamoto RN,  February 05, 2010 11:10 AM  Follow-up for Phone Call        Nasonex was changed to [facility] by Dr young in 12-2009 due to prior auth.  Flonase rx was sent [facility] at that time.  Spoke with pharmacist at [facility] and she was going to put rx for Nasonex on hold since pt was currently prescribed Flonase. Follow-up by: Abigail Miyamoto RN,  February 05, 2010 11:12 AM

## 2010-08-04 NOTE — Progress Notes (Signed)
Summary: Nasonex Prior Auth  Phone Note Outgoing Call   Call placed by: Gweneth Dimitri RN,  December 15, 2009 2:47 PM Summary of Call: [facility]onex.  Called [facility] to initaite Prior Auth.  However, preferred drug is fluticasone.  Will forward message to CY-pls advise if ok to change nasonex to fluticasone or proceed with prior auth?  Thanks! Initial call taken by: Gweneth Dimitri RN,  December 15, 2009 2:49 PM     Appended Document: Nasonex Prior Auth-change to flonase Change to fluticasone per CDY; sent to pharmacy.   Clinical Lists Changes  Medications: Changed medication from NASONEX 50 MCG/ACT SUSP (MOMETASONE FUROATE) 1-2 sprays each nostril once every night at bedtrime to FLONASE 50 MCG/ACT SUSP (FLUTICASONE PROPIONATE) 1-2 sprays in each nostril once a day - Signed Rx of FLONASE 50 MCG/ACT SUSP (FLUTICASONE PROPIONATE) 1-2 sprays in each nostril once a day;  #1 x 6;  Signed;  Entered by: Reynaldo Minium CMA;  Authorized by: Waymon Budge MD;  Method used: Electronically to [facility] [address] [phone]*, [address] [place], [place], [place]  [address], Ph: [phone] or [phone], Fax: [phone]    Prescriptions: FLONASE 50 MCG/ACT SUSP (FLUTICASONE PROPIONATE) 1-2 sprays in each nostril once a day  #1 x 6   Entered by:   Reynaldo Minium CMA   Authorized by:   Waymon Budge MD   Signed by:   Reynaldo Minium CMA on 12/17/2009   Method used:   Electronically to        [facility] [address] [phone]* (retail)       [address]       [place], [place]  [address]       Ph: [phone] or [phone]       Fax: [phone]   RxID:   [id number]

## 2010-08-04 NOTE — Assessment & Plan Note (Signed)
Summary: FLU SHOT/MHH  Nurse Visit   Allergies: 1)  ! * Lyrica 2)  ! * "carisopidol"  Orders Added: 1)  Admin 1st Vaccine [90471] 2)  Flu Vaccine 82yrs + [01601] Flu Vaccine Consent Questions     Do you have a history of severe allergic reactions to this vaccine? no    Any prior history of allergic reactions to egg and/or gelatin? no    Do you have a sensitivity to the preservative Thimersol? no    Do you have a past history of Guillan-Barre Syndrome? no    Do you currently have an acute febrile illness? no    Have you ever had a severe reaction to latex? no    Vaccine information given and explained to patient? yes    Are you currently pregnant? no    Lot Number:[id number]   Exp Date:01/02/2011   Site Given  Right Deltoid IM  Tammy Scott  April 07, 2010 9:05 AM
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
## 2010-08-04 NOTE — Progress Notes (Signed)
Summary: nos appt  Phone Note Call from Patient   Caller: [email or web address]  Call For: young Summary of Call: LMTCB x2 to rsc nos from 10/24. Initial call taken by: Darletta Moll,  April 28, 2010 3:45 PM

## 2010-08-04 NOTE — Progress Notes (Signed)
Summary: Advair Prior Auth-waiting for pt to call back to make her aware.  Phone Note Outgoing Call   Call placed by: Gweneth Dimitri RN,  December 15, 2009 2:37 PM Summary of Call: Received Request from pharm to initiate prior auth for Advair 250/50.  Called to initiate PA-spoke with [facility].  Per Meriam Sprague, preferred drug is Qvar.  Will forward message to CY-pls advise if you would like to change Advair to Qvar or proceed with PA?  Thanks! Initial call taken by: Gweneth Dimitri RN,  December 15, 2009 2:39 PM  Follow-up for Phone Call        Spoke with PA dept; approval given for Advair from 12-17-09 through 12-17-2011. Pharmacy aware. Left message for pt on voicemail to call back to let her know about this med and her nasal spray being changed.Reynaldo Minium CMA  December 17, 2009 11:43 AM

## 2010-08-04 NOTE — Miscellaneous (Signed)
Summary: Injection Record / [facility]    Injection Record / [facility]    Imported By: Lennie Odor 03/06/2010 09:54:27  _____________________________________________________________________  External Attachment:    Type:   Image     Comment:   External Document

## 2010-08-07 DIAGNOSIS — J301 Allergic rhinitis due to pollen: Secondary | ICD-10-CM

## 2010-08-07 NOTE — Miscellaneous (Signed)
Summary: Injection Financial risk analyst   Imported By: Sherian Rein 05/27/2010 14:06:52  _____________________________________________________________________  External Attachment:    Type:   Image     Comment:   External Document

## 2010-08-07 NOTE — Miscellaneous (Signed)
Summary: Injection Record/[facility]  Injection Record/[facility]   Imported By: Sherian Rein 11/25/2009 08:42:04  _____________________________________________________________________  External Attachment:    Type:   Image     Comment:   External Document

## 2010-08-21 ENCOUNTER — Ambulatory Visit (INDEPENDENT_AMBULATORY_CARE_PROVIDER_SITE_OTHER): Payer: Medicaid Other

## 2010-08-21 DIAGNOSIS — J301 Allergic rhinitis due to pollen: Secondary | ICD-10-CM

## 2010-08-28 ENCOUNTER — Ambulatory Visit (INDEPENDENT_AMBULATORY_CARE_PROVIDER_SITE_OTHER): Payer: Medicaid Other

## 2010-08-28 DIAGNOSIS — J301 Allergic rhinitis due to pollen: Secondary | ICD-10-CM

## 2010-09-04 ENCOUNTER — Encounter: Payer: Self-pay | Admitting: Internal Medicine

## 2010-09-04 ENCOUNTER — Ambulatory Visit (INDEPENDENT_AMBULATORY_CARE_PROVIDER_SITE_OTHER): Payer: Medicaid Other

## 2010-09-04 DIAGNOSIS — J301 Allergic rhinitis due to pollen: Secondary | ICD-10-CM | POA: Insufficient documentation

## 2010-09-10 NOTE — Assessment & Plan Note (Signed)
Summary: ALLERGY/CB  Nurse Visit   Allergies: 1)  ! * Lyrica 2)  ! * "carisopidol"  Orders Added: 1)  Allergy Injection (1) [95115] 

## 2010-09-11 ENCOUNTER — Encounter: Payer: Self-pay | Admitting: Internal Medicine

## 2010-09-11 ENCOUNTER — Ambulatory Visit (INDEPENDENT_AMBULATORY_CARE_PROVIDER_SITE_OTHER): Payer: Medicaid Other

## 2010-09-11 DIAGNOSIS — J301 Allergic rhinitis due to pollen: Secondary | ICD-10-CM

## 2010-09-15 NOTE — Assessment & Plan Note (Signed)
Summary: ALLERGY/CB  Nurse Visit   Allergies: 1)  ! * Lyrica 2)  ! * "carisopidol"  Orders Added: 1)  Allergy Injection (1) [04540]

## 2010-09-17 LAB — BASIC METABOLIC PANEL
Calcium: 9.1 mg/dL (ref 8.4–10.5)
Chloride: 106 mEq/L (ref 96–112)
GFR calc Af Amer: >60 mL/min (ref >60)
Potassium: 3.2 mEq/L — ABNORMAL LOW (ref 3.5–5.1)

## 2010-09-17 LAB — D-DIMER, QUANTITATIVE: D-Dimer, Quant: 1.43 ug/mL-FEU — ABNORMAL HIGH (ref 0.00–0.48)

## 2010-09-17 LAB — DIFFERENTIAL
Basophils Absolute: 0 10*3/uL (ref 0.0–0.1)
Basophils Relative: 0 % (ref 0–1)
Eosinophils Relative: 2 % (ref 0–5)
Lymphocytes Relative: 32 % (ref 12–46)
Lymphs Abs: 3.2 10*3/uL (ref 0.7–4.0)

## 2010-09-17 LAB — POCT CARDIAC MARKERS
CKMB, poc: <1 ng/mL — ABNORMAL LOW (ref 1.0–8.0)
Myoglobin, poc: 54.8 ng/mL (ref 12–200)

## 2010-09-17 LAB — CBC
HCT: 31.7 % — ABNORMAL LOW (ref 36.0–46.0)
MCH: 28.3 pg (ref 26.0–34.0)
MCHC: 33.5 g/dL (ref 30.0–36.0)
MCV: 84.4 fL (ref 78.0–100.0)
Platelets: 309 10*3/uL (ref 150–400)
RBC: 3.76 MIL/uL — ABNORMAL LOW (ref 3.87–5.11)
WBC: 9.9 10*3/uL (ref 4.0–10.5)

## 2010-09-18 ENCOUNTER — Ambulatory Visit (INDEPENDENT_AMBULATORY_CARE_PROVIDER_SITE_OTHER): Payer: Medicaid Other

## 2010-09-18 ENCOUNTER — Encounter: Payer: Self-pay | Admitting: Internal Medicine

## 2010-09-18 DIAGNOSIS — J301 Allergic rhinitis due to pollen: Secondary | ICD-10-CM

## 2010-09-21 LAB — URINE CULTURE: Colony Count: 3000

## 2010-09-21 LAB — COMPREHENSIVE METABOLIC PANEL
ALT: 19 U/L (ref 0–35)
AST: 17 U/L (ref 0–37)
Albumin: 3.9 g/dL (ref 3.5–5.2)
Calcium: 9.1 mg/dL (ref 8.4–10.5)
GFR calc Af Amer: >60 mL/min (ref >60)
Sodium: 140 mEq/L (ref 135–145)
Total Protein: 7 g/dL (ref 6.0–8.3)

## 2010-09-21 LAB — CROSSMATCH
ABO/RH(D): A POS
Antibody Screen: NEGATIVE

## 2010-09-21 LAB — CBC
MCHC: 34.2 g/dL (ref 30.0–36.0)
Platelets: 306 10*3/uL (ref 150–400)
RBC: 4.05 MIL/uL (ref 3.87–5.11)
RDW: 14.4 % (ref 11.5–15.5)

## 2010-09-21 LAB — URINALYSIS, ROUTINE W REFLEX MICROSCOPIC
Glucose, UA: NEGATIVE mg/dL
Ketones, ur: NEGATIVE mg/dL
Leukocytes, UA: NEGATIVE
Nitrite: NEGATIVE
pH: 7.5 (ref 5.0–8.0)

## 2010-09-21 LAB — APTT: aPTT: 33 seconds (ref 24–37)

## 2010-09-21 LAB — URINE MICROSCOPIC-ADD ON

## 2010-09-21 LAB — DIFFERENTIAL
Lymphs Abs: 4.4 10*3/uL — ABNORMAL HIGH (ref 0.7–4.0)
Monocytes Relative: 4 % (ref 3–12)
Neutro Abs: 8.4 10*3/uL — ABNORMAL HIGH (ref 1.7–7.7)
Neutrophils Relative %: 62 % (ref 43–77)

## 2010-09-21 LAB — ABO/RH: ABO/RH(D): A POS

## 2010-09-22 NOTE — Assessment & Plan Note (Signed)
Summary: allergy/cb  Nurse Visit   Allergies: 1)  ! * Lyrica 2)  ! * "carisopidol"  Orders Added: 1)  Allergy Injection (1) [16109]

## 2010-09-24 LAB — BASIC METABOLIC PANEL
BUN: 4 mg/dL — ABNORMAL LOW (ref 6–23)
BUN: 5 mg/dL — ABNORMAL LOW (ref 6–23)
Calcium: 8.3 mg/dL — ABNORMAL LOW (ref 8.4–10.5)
Chloride: 102 mEq/L (ref 96–112)
GFR calc non Af Amer: >60 mL/min (ref >60)
GFR calc non Af Amer: >60 mL/min (ref >60)
Potassium: 3.2 mEq/L — ABNORMAL LOW (ref 3.5–5.1)
Potassium: 3.5 mEq/L (ref 3.5–5.1)
Sodium: 135 mEq/L (ref 135–145)
Sodium: 136 mEq/L (ref 135–145)

## 2010-09-24 LAB — CBC
HCT: 31.5 % — ABNORMAL LOW (ref 36.0–46.0)
HCT: 32.3 % — ABNORMAL LOW (ref 36.0–46.0)
Hemoglobin: 10.6 g/dL — ABNORMAL LOW (ref 12.0–15.0)
MCV: 85.6 fL (ref 78.0–100.0)
Platelets: 296 10*3/uL (ref 150–400)
RBC: 3.68 MIL/uL — ABNORMAL LOW (ref 3.87–5.11)
RDW: 14.3 % (ref 11.5–15.5)
WBC: 14.9 10*3/uL — ABNORMAL HIGH (ref 4.0–10.5)
WBC: 15.4 10*3/uL — ABNORMAL HIGH (ref 4.0–10.5)

## 2010-09-25 ENCOUNTER — Ambulatory Visit (INDEPENDENT_AMBULATORY_CARE_PROVIDER_SITE_OTHER): Payer: Medicaid Other

## 2010-09-25 DIAGNOSIS — J301 Allergic rhinitis due to pollen: Secondary | ICD-10-CM

## 2010-10-02 ENCOUNTER — Ambulatory Visit (INDEPENDENT_AMBULATORY_CARE_PROVIDER_SITE_OTHER): Payer: Medicaid Other

## 2010-10-02 DIAGNOSIS — J301 Allergic rhinitis due to pollen: Secondary | ICD-10-CM

## 2010-10-15 LAB — DIFFERENTIAL
Basophils Absolute: 0.1 10*3/uL (ref 0.0–0.1)
Basophils Relative: 1 % (ref 0–1)
Lymphocytes Relative: 32 % (ref 12–46)
Monocytes Absolute: 0.4 10*3/uL (ref 0.1–1.0)
Monocytes Relative: 4 % (ref 3–12)
Neutro Abs: 7.3 10*3/uL (ref 1.7–7.7)
Neutrophils Relative %: 62 % (ref 43–77)

## 2010-10-15 LAB — COMPREHENSIVE METABOLIC PANEL
Albumin: 3.4 g/dL — ABNORMAL LOW (ref 3.5–5.2)
Alkaline Phosphatase: 69 U/L (ref 39–117)
BUN: 9 mg/dL (ref 6–23)
Creatinine, Ser: 0.67 mg/dL (ref 0.4–1.2)
Glucose, Bld: 114 mg/dL — ABNORMAL HIGH (ref 70–99)
Potassium: 3.5 mEq/L (ref 3.5–5.1)
Total Protein: 7 g/dL (ref 6.0–8.3)

## 2010-10-15 LAB — CBC
HCT: 34.9 % — ABNORMAL LOW (ref 36.0–46.0)
Hemoglobin: 12.2 g/dL (ref 12.0–15.0)
MCHC: 35 g/dL (ref 30.0–36.0)
MCV: 80.9 fL (ref 78.0–100.0)
Platelets: 294 10*3/uL (ref 150–400)
RDW: 15 % (ref 11.5–15.5)

## 2010-10-15 LAB — POCT CARDIAC MARKERS
CKMB, poc: <1 ng/mL — ABNORMAL LOW (ref 1.0–8.0)
Myoglobin, poc: 54.1 ng/mL (ref 12–200)

## 2010-10-23 ENCOUNTER — Ambulatory Visit (INDEPENDENT_AMBULATORY_CARE_PROVIDER_SITE_OTHER): Payer: Medicaid Other

## 2010-10-23 DIAGNOSIS — J309 Allergic rhinitis, unspecified: Secondary | ICD-10-CM

## 2010-11-06 ENCOUNTER — Ambulatory Visit (INDEPENDENT_AMBULATORY_CARE_PROVIDER_SITE_OTHER): Payer: Medicaid Other

## 2010-11-06 DIAGNOSIS — J309 Allergic rhinitis, unspecified: Secondary | ICD-10-CM

## 2010-11-09 ENCOUNTER — Ambulatory Visit (INDEPENDENT_AMBULATORY_CARE_PROVIDER_SITE_OTHER): Payer: Medicaid Other

## 2010-11-09 DIAGNOSIS — J309 Allergic rhinitis, unspecified: Secondary | ICD-10-CM

## 2010-11-13 ENCOUNTER — Ambulatory Visit (INDEPENDENT_AMBULATORY_CARE_PROVIDER_SITE_OTHER): Payer: Medicaid Other

## 2010-11-13 DIAGNOSIS — J309 Allergic rhinitis, unspecified: Secondary | ICD-10-CM

## 2010-11-17 ENCOUNTER — Ambulatory Visit: Payer: Medicaid Other | Admitting: Internal Medicine

## 2010-11-17 NOTE — Letter (Signed)
April 01, 2009    Dr. Kristeen Miss  [facility]  [address]  [place], [place] [address]   RE:  Kelly Butler, Kelly Butler  MRN:  [id number]  /  DOB:  1961-05-02   Dear Dr. Madelon Lips:   I am writing you a response to your request for pulmonary clearance  anticipating knee surgery on Kelly Butler.  I have been following her for  respiratory allergy issues since March 2010 and my most recent note is  enclosed.  She had previously been followed by Dr. Shelle Iron.  The last  chest x-ray I have is from 2008 and she has not had pulmonary function  tests.  She had a pneumonia vaccine on April 01, 2009.  There is  history of asthma which she has treated with nebulized bronchodilators.   CURRENT MEDICATIONS:  Omeprazole 40 mg daily for GERD and albuterol  0.083% nebulizer aerosol treatment q.i.d. p.r.n. asthma, Ventolin HFA  rescue inhaler 2 puffs q.i.d. p.r.n., loratadine 10 mg daily for  allergy, alprazolam 0.5 mg t.i.d. p.r.n. anxiety.  She has been started  on allergy vaccine injections which would be stopped during the surgical  interval and renewed on her return to the office.   ALLERGIES:  LYRICA with the itching and swelling, CARISOPRODOL for  itching.   Records refers to incidental discovery of an irregular heart rhythm at  time of an urgent care visit in April of this year.  Her pulse was  regular in my office.  She had some past history of a heart murmur.  My  most recent findings are described in my note of February 24, 2009.   In my opinion she is medically stable for planned surgery and  anesthesia, but I would recommend a preop EKG.  You may also want to  seek preoperative comment from Dr. Billee Cashing , her primary  physician.    Sincerely,      Clinton D. Maple Hudson, MD, Tonny Bollman, FACP  Electronically Signed    CDY/MedQ  DD: 04/01/2009  DT: 04/01/2009  Job #: [id number]

## 2010-11-17 NOTE — Assessment & Plan Note (Signed)
Kelly Butler is back regarding her fibromyalgia syndrome.  She is not finding  the Vicodin helpful at this point.  Rozarem has helped her sleep a bit.  She could not tolerate Lyrica due to swelling and itching.  She has only  had 2 sessions of therapy and really all they have worked on to this  point is TENS treatments.  The Effexor increase has not changed things  dramatically for her either.  She still rates her pain at 9 out of 10.  Her knees seem to be most painful, particularly the right knee.  Pain is  described as sharp, stabbing, burning and aching.  She does have diffuse  pain throughout however.  Pain interferes with general activity,  relations with others and enjoyment of life on a severe level.   REVIEW OF SYSTEMS:  Notable for numbness, tremor, tingling, spasms,  dizziness, suicidal thoughts at times although she states that she never  has any intention of following it through.  She also reports depression,  anxiety, abdominal pain, nausea, vomiting, constipation, limb swelling,  coughing.  I do not believe the patient has seen Dr. Leonides Cave yet for any  of her psychological needs.   SOCIAL HISTORY:  Is without significant change.  She is single and lives  with her 48 year old son.   PHYSICAL EXAMINATION:  Blood pressure 145/70, pulse is 114, respiratory  rate 20, she is sating 98% on room air.  Patient remains obese.  She is  flat.  Gait is antalgic favoring the right leg over the left.  She has  essentially 5/5 strength in all limbs with pain inhibition in both  knees.  Low back is tender to palpation but she is tender in all point  areas.  Trace swelling is seen in the legs.  Cognitively she is  generally intact and cranial nerve exam is normal.   ASSESSMENT:  1. Fibromyalgia syndrome.  2. Severe osteoarthritis of the knees.  3. Morbid obesity.  4. Anxiety/depression.  5. Bilateral carpal tunnel syndrome.   PLAN:  1. Change Vicodin to oxycodone for breakthrough pain #90.  2. Continue Effexor 75 mg daily.  3. Continue Rozarem 8 mg nightly.  4. Add Requip 0.5 mg one to two at bedtime for sleep and pain.  5. Begin trial of DHEA 25 mg - 50 mg daily.  6. Continue outpatient therapy and counseling.  7. Will set up patient with a FreeStride OA knee brace for the right      leg.  8. I will see her back in 1-2 month's time.  She may need to see      nursing in the interim.      Ranelle Oyster, M.D.  Electronically Signed     ZTS/MedQ  D:  03/07/2007 11:06:30  T:  03/07/2007 12:06:23  Job #:  [id number]   cc:   Tanya D. Daphine Deutscher, M.D.  Fax: [phone]

## 2010-11-17 NOTE — Procedures (Signed)
Kelly Butler, Kelly Butler                 ACCOUNT NO.:  [id number]   MEDICAL RECORD NO.:  [id number]          PATIENT TYPE:  REC   LOCATION:  [facility]                          FACILITY:  [facility]   PHYSICIAN:  Erick Colace, M.D.DATE OF BIRTH:  08-20-1960   DATE OF PROCEDURE:  DATE OF DISCHARGE:                               OPERATIVE REPORT   PROCEDURE:  Left L5 transforaminal lumbar epidural steroid injection  under fluoroscopic guidance.   INDICATIONS:  Lumbar radiculitis with L5 foraminal stenosis on the left.   Pain is only partially response to medication management as well as  other conservative care.   Informed consent was obtained after describing risks and benefits of the  procedure with the patient.  These include bleeding, bruising,  infection, temporary or permanent paralysis.  She elects to proceed and  has given written consent.  The patient was placed prone on the  fluoroscopy table.  Betadine prep, sterile drape, 25-gauge 1-1/2-inch  needle was used to anesthetize skin and subcutaneous tissue with 1%  lidocaine x2 mL.  Then a 22-gauge 3-1/2-inch spinal needle was inserted  under fluoroscopic guidance under AP, lateral, and oblique imaging.  The  L5-S1 intervertebral foramen was entered.  Omnipaque 180 under live  fluoro demonstrated good epidural and nerve root spread followed by  injection of 1 mL of 40 mg/mL Depo-Medrol and 2 mL of 1% lidocaine  without preservative.  The patient tolerated the procedure well.  Pre  and post injection vitals stable.  Post injection instructions given.  She will follow up with Dr. Riley Kill next month.      Erick Colace, M.D.  Electronically Signed     AEK/MEDQ  D:  09/28/2007 12:59:27  T:  09/29/2007 09:08:06  Job:  [id number]

## 2010-11-17 NOTE — Procedures (Signed)
Kelly Butler, Kelly Butler                 ACCOUNT NO.:  [id number]   MEDICAL RECORD NO.:  [id number]          PATIENT TYPE:  REC   LOCATION:  [facility]                          FACILITY:  [facility]   PHYSICIAN:  Erick Colace, M.D.DATE OF BIRTH:  [birth year]   DATE OF PROCEDURE:  08/10/2007  DATE OF DISCHARGE:                               OPERATIVE REPORT   PROCEDURE:  Left L5 transforaminal lumbar epidural steroid injection  under fluoroscopic guidance.   INDICATION:  Left lumbar radiculopathy.  Pain is only partially  responsive to medication management including narcotic analgesics and  interferes with self-care such as dressing, bathing, meal prep and  mobility symptoms, needing to use a cane.   Informed consent was obtained after describing risks and benefits of the  procedure to the patient.  These include bleeding, bruising, infection,  loss of bowel or bladder function, temporary permanent paralysis.  She  elects to proceed and has given written consent.  The patient placed  prone on fluoroscopy table.  Betadine prep, sterile drape, 25-gauge inch  and a half needle was used to anesthetize the skin and subcu tissue, 1%  lidocaine x2 this x2 mL.  Then a 22 gauge 3-1/2 inch spinal needle was  attempted to be inserted into the L4, L5-S1 intervertebral foramen;  however, due to excess adipose was not able to reach it.  Therefore,  switched out to a 22 gauge 5 inch spinal needle and under AP lateral and  oblique imaging, needle tip was placed into the L5-S5 foramen.  Then  Omnipaque 180 under live fluoro demonstrated good epidural spread and  nerve root spread.  No sign of intravascular uptake.  A solution  containing 1 mL of 40 mg/mL Depo-Medrol and 2 mL of 1% MPF with  lidocaine were injected.  The patient tolerated the procedure well.  Pre  and postinjection vitals stable.  Pre and postinjection pain level  stable at 9/10.  We will monitor a response, and I will see her back in  3-4  weeks, possible reinjection.  If this is not particularly helpful,  consider translaminar route.      Erick Colace, M.D.  Electronically Signed     AEK/MEDQ  D:  08/10/2007 13:14:48  T:  08/11/2007 12:04:20  Job:  [id number]   cc:   Ranelle Oyster, M.D.  Fax: [phone]

## 2010-11-17 NOTE — Op Note (Signed)
NAMETAKIESHA, MCDEVITT                 ACCOUNT NO.:  [id number]   MEDICAL RECORD NO.:  [id number]          PATIENT TYPE:  AMB   LOCATION:  ENDO                         FACILITY:  [facility]   PHYSICIAN:  Barbette Hair. Arlyce Dice, MD,FACGDATE OF BIRTH:  1961-05-31   DATE OF PROCEDURE:  DATE OF DISCHARGE:  02/10/2009                               OPERATIVE REPORT   PROCEDURE:  Esophageal manometry.   Ms. Yorks underwent esophageal manometry with a normal pull-through  technique.   The resting LES pressure was 51.2 mmHg.  Residual pressure with  swallowing was 4.7 mmHg with normal being less than 8 mm.  Percent  relaxation was 91%.   Peristaltic contractions were normal amplitude and duration.  100% of  the esophageal contractions were peristaltic   Upper esophageal sphincter pressure was 158.3 mmHg.  Pressure and  relaxation were normal.   IMPRESSION:  Normal esophageal manometry.      Barbette Hair. Arlyce Dice, MD,FACG  Electronically Signed     RDK/MEDQ  D:  02/21/2009  T:  02/21/2009  Job:  [id number]

## 2010-11-17 NOTE — Assessment & Plan Note (Signed)
Kelly Butler is back regarding her chronic pain.  She had a set of three left  L5 transforaminal injections by Dr. Wynn Banker.  One of them included an  L4 injection as well.  Last injection helped her leg pain for about a  week on the left side.  Other than that, she has reported no relief.  She had arthroscopic surgery on her right knee on April 6th by Dr.  Madelon Lips and notes no benefit as well.  She reports diffuse body pain in  her feet, legs, trunk, arms, shoulders, head.  I last saw her on January  6.  We had started Mestinon which she stopped after a month without  noticing significant difference.  She uses the Requip at night which has  helped her sleep somewhat.  She does not think that the fentanyl patch  increase has helped her pain dramatically.  Overall, her mood feels a  bit better.  She is getting outside some but not very often.  She rates  her pain an 8 to 10/10, described as sharp, burning, stabbing, aching,  tingling.  Pain interferes with general activity, relationships with  others and enjoyment of life on a moderate level.  Sleep is fair to  poor.   REVIEW OF SYSTEMS:  Notable for the above as well as some loss of taste,  confusion, depression, spasms, tremor, fever, high sugars, nausea,  vomiting, diarrhea, urine retention, limb swelling, coughing.  A full  review is in the written Health & History section.   SOCIAL HISTORY:  Unchanged.  Patient is living with her son.   PHYSICAL EXAM:  Blood pressure is 149/71, pulse is 95, respiratory rate  20, she is sating 96% in room air.  The patient is pleasant, a bit flat  but in no acute distress.  She has multiple areas, once again,  throughout the body that are sensitive in touch.  Strength 5/5.  Sensory  exam is normal.  She remains overweight.  HEART:  Regular.  CHEST:  Clear.  ABDOMEN:  Soft, nontender.   ASSESSMENT:  1. Fibromyalgia syndrome.  2. Severe depression with anxiety.  3. Osteoarthritis of the knees.  4.  Obesity.  5. Bilateral carpal tunnel syndrome.  6. History of constipation and abdominal pain.  7. Lumbar disk disease and questionable radiculopathy.   PLAN:  1. Due to the fact that patient has had a suboptimal response to any      direct intervention to the back or the knee itself, I am leaning      towards her symptomatology being more of a central origin related      to her fibromyalgia.  2. Along that line, I think that we will give her a trial on new SNRI,      called Savella, to replace her Effexor.  She will begin conversion      over the next week to 2 weeks' time.  She is given a sample pack      and instructions today.  3. Will increase her Requip to 4 mg nightly.  4. Patient asked to decreased her fentanyl patch which we will do down      to 25 mcg every 72 hours.  5. Refilled oxycodone for breakthrough pain.  6. Encouraged exercise and activity out of the house as well as her      stretching and range of motion program for her back.  She needs to      work on socialization as well.  7. Followup with Dr. Madelon Lips regarding knee pain.  8. I will see her back in about 2 months.  She was advised to call me      with any questions or concerns.      Ranelle Oyster, M.D.  Electronically Signed     ZTS/MedQ  D:  11/20/2007 12:13:26  T:  11/20/2007 13:04:34  Job #:  [id number]   cc:   Freddie Apley, Dr.  [facility]  [address], [place] [address]

## 2010-11-17 NOTE — Assessment & Plan Note (Signed)
Kelly Butler is back regarding her fibromyalgia and multiple pain complaints.  She states headaches have been worse over the last few months.  The  headache seemed to emanate from her neck, going up into the head.  She  tried [place], but did not continue that for a month, as she did not  notice a big change.  She never called me to discuss further treatment  with this.  She is no longer using Requip at nights.  She does use  Flexeril for spasm, but does not feel like this helps.  She is on her  fentanyl patch, currently 50 mcg q.72 h.  She is on oxycodone for  breakthrough pain.  Mood continues to be poor.  She has psychological  followup apparently, but no psychiatric care.  She rates her pain at a  10/10.  She apparently had a disability hearing and this has been  approved.  Pain is in the neck, back, abdomen, legs, etc.  She also  complains of specific pain in the right knee and right shoulder today.  Pain interferes with general activity, in relationship with others, and  enjoyment of life on a severe level.  The patient states that she can  walk 5-10 minutes with resting in between.  She uses a wheelchair on  days that are bad.  Son helps her at home as he is able.   REVIEW OF SYSTEMS:  Notable for the above.  She continues to have  significant depression and anxiety.  The patient has insomnia.   SOCIAL HISTORY:  Without significant change.  She lives with her son.   PHYSICAL EXAMINATION:  VITAL SIGNS:  Blood pressure is 157/78, pulse is  105 and regular, O2 sats are in the 90s, and respiratory rates in the  20s.  GENERAL:  The patient appears anxious and depressed.  EXTREMITIES:  Strength is generally 5/5 except for pain inhibition at  the right shoulder and knee.  She is limited with lumbar flexion  extension as well as cervical flexion extension today.  She remains  overweight.  Her right knee is notable for some crepitus with movement  as is the right shoulder.  No instability of  the knee was appreciated.  Knee was generally tender throughout the infra and suprapatellar  regions.  HEART:  Tachycardic.  CHEST:  Clear.  ABDOMEN:  Soft and nontender.   ASSESSMENT:  1. Fibromyalgia syndrome.  2. Severe depression with anxiety.  3. Osteoarthritis of the knees, right greater than left.  4. Obesity.  5. Bilateral carpal tunnel syndrome.  6. History of constipation and abdominal pain.  7. Lumbar disk disease and questionable radiculopathy.  8. Cervicalgia.   PLAN:  1. We will change her muscle relaxant from Flexeril to Soma to see if      this gives her any more benefit according to neck and headaches.      She also needs to work on modalities including heat, ice, and      stretching.  She can try Voltaren gel as well as muscle balms to      the area.  2. I want her to begin a Savella trial, but will wait until we have      samples once again in the office.  For now, continue Effexor.  3. Maintain Duragesic 50 mcg q.72 h. with oxycodone for breakthrough      pain.  4. After informed consent, I injected the right knee with 40 mg of  Kenalog and 3 mL of 1% lidocaine.  The area was prepped first with      Betadine and cleaned afterwards without any complications.  5. Encourage psychiatric followup.  6. We have reviewed medication changes and plan in depth.  She is      asked to call if any problems or changes in her status.  We will      see her back in 1 month with the nurse clinic and 2 months with me.      Ranelle Oyster, M.D.  Electronically Signed     ZTS/MedQ  D:  03/04/2008 11:41:52  T:  03/05/2008 02:19:46  Job #:  [id number]   cc:   Dyke Brackett, M.D.  Fax: [phone]

## 2010-11-17 NOTE — Letter (Signed)
October 31, 2008    Kelly Butler  [address]  [place], [place]  [address]   RE:  Kelly Butler, Kelly Butler  MRN:  [id number]  /  DOB:  1960/08/10   Dear [name]:   I am writing to inform you that I can no longer be your pulmonologist  for the management of your asthma.  You have either cancelled or failed  to show for 7 office visits that were scheduled over the last month and  half.  There is no way that I can adequately manage your disease state  without timely followup.  I will be happy to provide for your care over  the next 30 days, after which you will need to find a new pulmonologist.  Please call the office if you have further questions or concerns.    Sincerely,      Barbaraann Share, MD,FCCP  Electronically Signed    [facility]/MedQ  DD: 10/31/2008  DT: 11/01/2008  Job #: [id number]

## 2010-11-17 NOTE — Assessment & Plan Note (Signed)
Kelly Butler is back regarding her multiple pain issues.  Requip is still  helping her sleep a bit.  She likes the fentanyl patch.  She is having a  bad day today due to the cold weather and rain.  She rates her pain a 6-  7 on average now.  Mood has been about the same.  She has had one visit  with a psychologist, who is helping her with referrals to a  psychiatrist.  TENS unit did not help initially because she was unable  to control the amplitude on her own.  She wants to try a different unit  and will talk to therapy about that.  Her orthopedic surgeons made no  changes.  She has her Flexeril strip waiting at the pharmacy.  She has  her MRI pending next week.   REVIEW OF SYSTEMS:  Notable for multiple items.  Essentially every item  is checked on our list, and this is listed in the health and history  section of the chart.   SOCIAL HISTORY:  The patient is single, living with her son.   PHYSICAL EXAM:  Blood pressure is 151/116, pulse is 88, respiratory rate  18, she is saturating 97% on room air.  The patient is flat and appears to be in general distress.  She is  wearing her knee brace and left wrist splint again today.  She has  multiple areas of pain throughout the limbs, trunk, etc.  Mood is  essentially stable.  She talks more when she is engaged.  Strength is  generally 5/5.  Sensory exam is intact.  She remains overweight.  Cognitively, she is appropriate.   ASSESSMENT:  1. Fibromyalgia syndrome.  2. Severe depression with anxiety.  3. Osteoarthritis of both knees.  4. Morbid obesity.  5. Bilateral carpal tunnel syndrome.  6. History of constipation and abdominal pain.   PLAN:  1. Continue psychological management and hopeful psychiatric referral.  2. Will increase fentanyl patch to 50 mcg q.72h.  She will observe for      signs and symptoms of constipation.  3. Will try Mestinon 30 mg q.12h. for one week, then up to 60 mg      q.12h. thereafter to assist with pain and  muscle endurance and      activity.  4. Continue outpatient therapies with [facility].  5. Await MRI study of back.  6. Orthopedic follow-up.  7. She will see me back in 2 months with nurse clinic follow-up in one      month's time.      Ranelle Oyster, M.D.  Electronically Signed     ZTS/MedQ  D:  07/11/2007 10:47:30  T:  07/11/2007 12:00:36  Job #:  [id number]

## 2010-11-17 NOTE — Assessment & Plan Note (Signed)
Layne is back regarding her pain.  Apparently yesterday while in  therapy, the patient had expressed suicidal ideations.  Dr. Eula Flax had seen the patient and concluded that she was severely  depressed.  The patient agreed, however, that she would not hurt herself  yesterday.  He made an agreement that she would call 911 or go to the  emergency room if she was about to hurt herself.  We discussed that  episode yesterday.  She is still feeling very depressed but does not  currently feel as if she would harm herself, however.  She feels that  Oxycodone helps for her pain somewhat and lasts four to six hours.  The  Rozerem helps somewhat with sleep, but she still wakes up in the middle  of the night.  She has not started DHEA due to a friend's  recommendation.  Therapy has been ongoing.  She received a TENS unit,  which she likes and helps her walk a bit longer.  She is going for a  knee brace today.  She rates her pain a 9 to 10 out of 10 on an average.  It is diffuse.  Most predominant in the knees and back.  The pain is  present throughout the day, particularly in the morning.  Sleep is noted  above.  Pain also worsens with activity due to walking, bending,  standing, and sitting.   REVIEW OF SYSTEMS:  Notable for numbness, tremor, tingling, trouble  walking, spasms, dizziness, confusion, depression, suicidal thoughts,  loss of taste, fever, weight gain, night sweats, nausea, vomiting,  diarrhea, constipation, painful urination, abdominal pain, poor  appetite, limb swelling, coughing, shortness of breath, sleep apnea, and  wheezing.  We talked about her diet, and she states that she is eating  one meal a day in order to lose weight and does note that she is very  hungry during the rest of the day when she is not eating.   SOCIAL HISTORY:  The patient lives alone and with her 48 year old son,  who she states makes life worth living for.   PHYSICAL EXAMINATION:  VITAL SIGNS:   Blood pressure is 150/83, pulse is  88, respiratory rate is 18, her saturation is 98% on room air.  GENERAL:  The patient is pleasant, alert and oriented x3.  Affect is  very flat.  She remains overweight.  PAIN AND REHAB EVALUATION:  She is more antalgic on the right leg once  again.  Strength is generally 5 out of 5.  She has multiple tender  points throughout.  She has swelling in the legs.  HEART:  Regular.  CHEST:  Clear.  ABDOMEN:  Soft and nontender.  NEUROLOGIC:  Cognitively she is appropriate with normal cranial nerve  exam.   ASSESSMENT:  1. Fibromyalgia syndrome.  2. Severe osteoarthritis of the knees.  3. Morbid obesity.  4. Severe depression with anxiety.  5. Bilateral carpal tunnel syndrome.   PLAN:  1. Will make a referral to Dr. Milagros Evener for psychiatric      evaluation.  In the meantime, Dr. Leonides Cave is following with the      patient.  He will see her again on November 3rd.  We had already      scheduled her to see him prior to this episode.  She agreed once      again that she is not planning on hurting herself and agreed to go      to the emergency room  or call 911 if things should change.  I did      not make any changes to her Effexor today as we just increased that      at last visit.  I may need to look at a different agent.  I would      prefer leaving that up to the psychiatrist, however.  2. Continue Rozerem 8 mg at night.  3. Increase Requip to 2 mg q.h.s.  4. Begin DHEA 25 mg daily.  5. Continue outpatient therapy to work on pain-related issues and      exercise.  6. I want the patient to make a commitment to get out of the house and      socialize a bit and find some things that interest her and get her      mind off her pain.  7. I discussed appropriate dietary measures, specifically eating three      meals a day and looking at healthier options for food.  8. I will see her back in about one month's time.  She will call me if      any  questions or concerns arise.  I did refill Oxycodone as well      today, 5 mg q.8h p.r.n., #90.      Ranelle Oyster, M.D.  Electronically Signed     ZTS/MedQ  D:  05/05/2007 12:05:29  T:  05/05/2007 21:26:58  Job #:  [id number]   cc:   Dr. Mercy Riding  [facility]   [address] [place], [place], [place]

## 2010-11-17 NOTE — Assessment & Plan Note (Signed)
Ms. Geil returns today.  I last saw her August 31, 2007 for left L4  transforaminal, left L5 transforaminal epidural steroid injection under  fluoroscopic guidance.  She had a previous L5 transforaminal injection,  which resulted in partial improvement to pain on August 10, 2007 and  given that she had more diffuse in nature left lower extremity pain, I  elected to go up one level higher as well.   She states that since the injection she has had some increased pain not  only in her back but in her neck.  She has had no fever, no chills.  She  has not changed any of her medications.   PAST MEDICAL HISTORY:  Significant for anxiety and depression.   No lower extremity weakness.  She is independent with self care and  mobility.   EXAMINATION:  Blood pressure 135/68, pulse 76, respirations 20.  O2  saturation 99% on room air.  GENERAL:  In no acute distress.  Mood and affect appropriate.  Gait is normal.  Lower extremity strength is 5/5 in the hip flexors,  knee extensors, ankle dorsiflexors and upper extremity strength 5/5 in  deltoids, triceps, biceps, grip.  She has mild tenderness to palpation  in the cervical and lumbar as well as thoracic paraspinals.  She has  lumbar spine range of motion 50% forward flexion extension.  She has  normal deep tendon reflexes bilateral biceps, triceps, brachioradialis,  knee and ankle.  She has normal sensations in both upper and lower  extremities.  She has normal range of motion in both upper and lower  extremities.   IMPRESSION:  1. Lumbar thoracic and cervical paraspinal muscle spasm.  Appears to      have been worse over the last couple of weeks.  She actually      started noticing this post injection.  Has been chronically on      Flexeril for muscle spasm.  2. No evidence of post injection complications.  No evidence of      infection.   PLAN:  1. We will give her some samples of Voltaren gel to be placed on      cervical and lumbar  paraspinals q.i.d.  She has one tube at home.      We have given her a second tube.  2. Follow up with Dr. Riley Kill in two weeks.   Given no significant relief with epidural steroid injections, I would  not recommend a repeat.      Erick Colace, M.D.  Electronically Signed     AEK/MedQ  D:  09/12/2007 09:46:06  T:  09/12/2007 15:37:41  Job #:  [id number]   cc:   Ranelle Oyster, M.D.  Fax: [phone]

## 2010-11-17 NOTE — Assessment & Plan Note (Signed)
Kelly Butler is back regarding her pain.  She found that the Requip helped her  a bit for her nighttime sleep and restless leg symptoms.  She is still  having a lot of pain in her knee which Dr. Madelon Lips is assessing her for.  She is having more low back pain and some abdominal pain.  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  Also has problems when she walks.  She has pain in her abdomen  when she voids as well as throughout the day.  She noted some blood in  her stool, bright red, and her bowels have been quite constipated.  She  rates her pain 9-10 out of 10.  We were unable to find psychiatric  referral for her.  Apparently she found a doctor who takes Medicaid, who  I was not familiar with.  She is working on getting that set up here  over the next couple of weeks.   The patient describes her pain as sharp, dull, stabbing, tingling,  aching.  She has pain head to toe, from neck to legs.  Mood has been up  and down.  She has had some suicidal thoughts but no intention of acting  on them.  She has been using friends and making phone calls to others  for emotional support.  She remains on Effexor XR 75 mg 3 tablets once  daily.   REVIEW OF SYSTEMS:  Notable for almost every item on my checklist.  Please see history section.   SOCIAL HISTORY:  The patient lives alone and is single.   PHYSICAL EXAMINATION:  VITAL SIGNS:  Blood pressure is 146/89, pulse is  113, respiratory rate is 20.  She is satting 97% in room air.  MUSCULOSKELETAL:  The patient walks with antalgic gait favoring the  right leg today.  She has a right knee brace on.  She had pain with  palpation over the lower lumbar paraspinous and facet.  She had pain  with all movement to the back but particularly extension and facet  maneuvers today.  Distal extremities appeared a bit swollen.  She  remains obese.  Carpal tunnel exam is stable.  PSYCHIATRIC:  Mood was a bit distressed.   She did not appear overly  anxious.  Cognitively she was intact.   ASSESSMENT:  1. Fibromyalgia syndrome.  2. Severe osteoarthritis of the knees.  3. Morbid obesity.  4. Severe depression with anxiety.  5. Bilateral carpal tunnel syndrome.  6. Constipation and abdominal pain versus interstitial cystitis.   PLAN:  1. Await psychiatric referral.  2. Will add long-acting opiate Fentanyl patch 25 mcg q.72 h to      decrease her overall pain.  3. Await surgical plan per Dr. Madelon Lips.  4. Oxycodone for breakthrough pain.  5. Add Soma 350 mg q.8 h p.r.n. for muscle spasms.  6. Begin therapy at [facility] location for low back range      of motion, strengthening.  TENS unit trial also will be done.  7. I will have her go for an MRI of the low back to discern pathology.      The last exam she had was in 2006 which      was unremarkable.  8. I will see her back in about 1 month's time.      Ranelle Oyster, M.D.  Electronically Signed     ZTS/MedQ  D:  06/09/2007 10:53:27  T:  06/09/2007 12:39:03  Job #:  [id number]   cc:   [facility] Dr. Markham Jordan, [place]
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
## 2010-11-17 NOTE — Procedures (Signed)
Kelly Butler, [place]                 ACCOUNT NO.:  [id number]   MEDICAL RECORD NO.:  [id number]          PATIENT TYPE:  REC   LOCATION:  [facility]                          FACILITY:  [facility]   PHYSICIAN:  Erick Colace, M.D.DATE OF BIRTH:  08/18/1960   DATE OF PROCEDURE:  DATE OF DISCHARGE:                               OPERATIVE REPORT   PROCEDURE:  Left L4 transforaminal injection and left L5 transforaminal  epidural steroid injection under fluoroscopic guidance.   INDICATIONS:  Left lower extremity radiculopathy with MRI findings  demonstrating left L5-S1 disk protrusion abutting on the left L5 nerve  and a left eccentric disk bulge.   The patient had partial relief with L5 transforaminal injection 1 month  ago.  Will do 2 levels at this time to see if additional benefit can be  achieved.  Pain is only partially responsive to medication management  including narcotic analgesic medications and interferes with self-care  and mobility.   Informed consent was obtained after describing risks and benefits of the  procedure to the patient.  These include bleeding, bruising, infection,  loss of bowel and bladder function, temporary or permanent paralysis.  She elects to proceed and has given written consent.  The patient placed  prone on fluoroscopy table.  Betadine prep, sterile drape.  A 25-gauge 1-  1/2-inch needle was used to anesthetize skin and subcu tissue with 1%  lidocaine x2 mL.  Then, a 22-gauge 5-inch spinal needle was inserted  first in the left L5-S1 intervertebral foramen.  AP, lateral, and  oblique imaging utilized.  Omnipaque 180 x 0.5 mL demonstrated no  intravascular uptake and good epidural spread followed by injection of 1  mL of __________  then, the same procedure was repeated at the left L4-5  intervertebral foramen using same needle injectate and technique.  The  patient tolerated procedure well.  Pre/post injection vitals stable.  Post-injection instructions  given.  Return in 1 month for repeat  injection should she has continued left lower extremity pain      Erick Colace, M.D.  Electronically Signed     AEK/MEDQ  D:  08/31/2007 14:54:05  T:  09/01/2007 13:53:51  Job:  [id number]

## 2010-11-20 ENCOUNTER — Ambulatory Visit (INDEPENDENT_AMBULATORY_CARE_PROVIDER_SITE_OTHER): Payer: Medicaid Other

## 2010-11-20 ENCOUNTER — Ambulatory Visit: Payer: Medicaid Other | Admitting: Internal Medicine

## 2010-11-20 ENCOUNTER — Telehealth: Payer: Self-pay | Admitting: Internal Medicine

## 2010-11-20 DIAGNOSIS — J309 Allergic rhinitis, unspecified: Secondary | ICD-10-CM

## 2010-11-20 NOTE — Op Note (Signed)
NAME:  Kelly Butler, Kelly Butler                 ACCOUNT NO.:  [id number]   MEDICAL RECORD NO.:  [id number]          PATIENT TYPE:  AMB   LOCATION:  DSC                          FACILITY:  [facility]   PHYSICIAN:  Dyke Brackett, M.D.    DATE OF BIRTH:  1960/08/23   DATE OF PROCEDURE:  03/19/2005  DATE OF DISCHARGE:                                 OPERATIVE REPORT   INDICATIONS:  A 50 year old with MRI-proven meniscal tear, thought to be  amenable to outpatient surgery.   PREOPERATIVE DIAGNOSES:  1.  Interstitial tear right posterior medial meniscus.  2.  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  3.  Prominent plica.   POSTOPERATIVE DIAGNOSES:  1.  Interstitial tear right posterior medial meniscus.  2.  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  3.  Prominent plica.   OPERATIONS:  1.  Partial medial meniscectomy.  2.  Debridement chondroplasty.  3.  Excision plica.   SURGEON:  Dyke Brackett, M.D.   ANESTHESIA:  Block, with general.   DESCRIPTION OF PROCEDURE:  She was arthroscoped via an inferomedial and  inferolateral portal.  Systematic inspection of the knee showed the patient  to have very mild grade 3 chondromalacia medial facet patella, debrided.  Thick medial shelf resected.  Posterior horn showed an interstitial tear.  There was actually some redundancy.  I could not really see a through-and-  through tear, but there was interstitial fraying, leading to a very  prominent redundant edge of the meniscus that was trimmed back, probably  resecting about 15-20% of the meniscus substance.  This had an appearance of  a degenerative tear that was incomplete, but it was rubbing in the joint  with some mild chondral irregularity around this frayed edge.  It was  trimmed back.  The ACL, PCL, lateral compartment, and lateral meniscus were  normal.  Began debridement chondroplasty.  Excision of plica carried out  separate from the medial meniscectomy.  Knee  drained free of fluid.  Portals  were closed with nylon, and the knee infiltrated with Marcaine and morphine  in addition to 4 mg Depo-Medrol.  Taken to the recovery room in stable  condition.      Dyke Brackett, M.D.  Electronically Signed    WDC/MEDQ  D:  03/19/2005  T:  03/19/2005  Job:  [id number]

## 2010-11-20 NOTE — Telephone Encounter (Signed)
LMTCB

## 2010-11-23 ENCOUNTER — Ambulatory Visit: Payer: Medicaid Other | Admitting: Internal Medicine

## 2010-11-23 ENCOUNTER — Ambulatory Visit (INDEPENDENT_AMBULATORY_CARE_PROVIDER_SITE_OTHER): Payer: Medicaid Other

## 2010-11-23 DIAGNOSIS — J309 Allergic rhinitis, unspecified: Secondary | ICD-10-CM

## 2010-11-24 ENCOUNTER — Ambulatory Visit: Payer: Medicaid Other | Admitting: Internal Medicine

## 2010-11-25 MED ORDER — ALBUTEROL SULFATE (2.5 MG/3ML) 0.083% IN NEBU: 2.5000 mg | INHALATION_SOLUTION | Freq: Four times a day (QID) | RESPIRATORY_TRACT | Status: DC | PRN

## 2010-11-25 MED ORDER — ALBUTEROL SULFATE HFA 108 (90 BASE) MCG/ACT IN AERS: 2.0000 | INHALATION_SPRAY | Freq: Four times a day (QID) | RESPIRATORY_TRACT | Status: DC | PRN

## 2010-11-25 MED ORDER — DESLORATADINE 5 MG PO TABS: 5.0000 mg | ORAL_TABLET | Freq: Every day | ORAL | Status: DC

## 2010-11-25 MED ORDER — MONTELUKAST SODIUM 10 MG PO TABS: 10.0000 mg | ORAL_TABLET | Freq: Every day | ORAL | Status: DC

## 2010-11-25 NOTE — Telephone Encounter (Signed)
Spoke with pt and notified that rxs have been sent to pharm, nothing further needed

## 2010-12-04 ENCOUNTER — Ambulatory Visit (INDEPENDENT_AMBULATORY_CARE_PROVIDER_SITE_OTHER): Payer: Medicaid Other

## 2010-12-04 DIAGNOSIS — J309 Allergic rhinitis, unspecified: Secondary | ICD-10-CM

## 2010-12-07 ENCOUNTER — Encounter: Payer: Self-pay | Admitting: Internal Medicine

## 2010-12-11 ENCOUNTER — Ambulatory Visit (INDEPENDENT_AMBULATORY_CARE_PROVIDER_SITE_OTHER): Payer: Medicaid Other

## 2010-12-11 DIAGNOSIS — J309 Allergic rhinitis, unspecified: Secondary | ICD-10-CM

## 2010-12-15 ENCOUNTER — Telehealth: Payer: Self-pay | Admitting: Internal Medicine

## 2010-12-15 NOTE — Telephone Encounter (Signed)
Spoke with pt and advised she needs to try cetirizine or loratidine first before clarinex can be covered by her ins. She verbalized understanding.

## 2010-12-15 NOTE — Telephone Encounter (Signed)
Per CY-please explain this to patient-must use the OTC meds for allergy.

## 2010-12-15 NOTE — Telephone Encounter (Signed)
Clarinex is not covered by the pt's insurance. Per [facility], Loratadine and Cetirizine are the preferred medications and the pt must first try and fail both of these medications. Pls advise on changing RX. Allergies  Allergen Reactions  . Pregabalin     REACTION: itching, swelling

## 2010-12-25 ENCOUNTER — Ambulatory Visit (INDEPENDENT_AMBULATORY_CARE_PROVIDER_SITE_OTHER): Payer: Medicaid Other

## 2010-12-25 ENCOUNTER — Telehealth: Payer: Self-pay | Admitting: Internal Medicine

## 2010-12-25 DIAGNOSIS — J309 Allergic rhinitis, unspecified: Secondary | ICD-10-CM

## 2010-12-25 NOTE — Telephone Encounter (Signed)
lmomtcb  

## 2010-12-25 NOTE — Telephone Encounter (Signed)
Attempted to call pt but no answer--lmomtcb.

## 2010-12-28 NOTE — Telephone Encounter (Signed)
LMOMTCB

## 2010-12-29 NOTE — Telephone Encounter (Signed)
LMTCB and will sign per protocol  

## 2011-01-01 ENCOUNTER — Ambulatory Visit (INDEPENDENT_AMBULATORY_CARE_PROVIDER_SITE_OTHER): Payer: Medicaid Other

## 2011-01-01 DIAGNOSIS — J309 Allergic rhinitis, unspecified: Secondary | ICD-10-CM

## 2011-01-11 ENCOUNTER — Encounter: Payer: Self-pay | Admitting: Internal Medicine

## 2011-01-11 ENCOUNTER — Ambulatory Visit (INDEPENDENT_AMBULATORY_CARE_PROVIDER_SITE_OTHER): Payer: Medicaid Other

## 2011-01-11 ENCOUNTER — Ambulatory Visit (INDEPENDENT_AMBULATORY_CARE_PROVIDER_SITE_OTHER): Payer: Medicaid Other | Admitting: Internal Medicine

## 2011-01-11 VITALS — BP 120/76 | HR 81 | Ht 60.0 in | Wt 194.6 lb

## 2011-01-11 DIAGNOSIS — J45909 Unspecified asthma, uncomplicated: Secondary | ICD-10-CM

## 2011-01-11 DIAGNOSIS — J309 Allergic rhinitis, unspecified: Secondary | ICD-10-CM

## 2011-01-11 DIAGNOSIS — J301 Allergic rhinitis due to pollen: Secondary | ICD-10-CM

## 2011-01-11 DIAGNOSIS — R51 Headache: Secondary | ICD-10-CM

## 2011-01-11 MED ORDER — AZITHROMYCIN 250 MG PO TABS: ORAL_TABLET | ORAL | Status: AC

## 2011-01-11 MED ORDER — MONTELUKAST SODIUM 10 MG PO TABS: 10.0000 mg | ORAL_TABLET | Freq: Every day | ORAL | Status: DC

## 2011-01-11 MED ORDER — FLUTICASONE-SALMETEROL 250-50 MCG/DOSE IN AEPB: 1.0000 | INHALATION_SPRAY | Freq: Two times a day (BID) | RESPIRATORY_TRACT | Status: DC

## 2011-01-11 NOTE — Progress Notes (Signed)
  Subjective:    Patient ID: Kelly Butler, female    DOB: 1961-03-05, 50 y.o.   MRN: [id number]  HPI 01/11/11- 50 yo F former smoker followed for Asthma, Allergic rhinitis, GERD, complicated by GERD, VCD, chronic headache.     Female companion here Last here June 10., 2011- note reviewed.  Today complains of persistent frontal headache for several days w/o numbness, syncope, vision change. She will address this with her family doctor.  "Sort of stopped up in head" in the mornings, not blowing out anything. At least once it got better for awhile after she came here for allergy shot. We restarted allergy shots - currently at 1:500 GH. Dose adjusted prn by allergy lab for local reaction. She feels the shots help. Some wheeze. Not using her Advair- says no refill. . Uses rescue inhaler or her nebulizer. Says  clarinex worked much better than otc antihistamine.  Review of Systems Constitutional:   No weight loss, night sweats,  Fevers, chills, fatigue, lassitude. HEENT:   No,  Difficulty swallowing,  Tooth/dental problems,  Sore throat,                No sneezing, itching, ear ache, nasal congestion, post nasal drip,   CV:  No chest pain,  Orthopnea, PND, swelling in lower extremities, anasarca, dizziness, palpitations  GI  No heartburn, indigestion, abdominal pain, nausea, vomiting, diarrhea, change in bowel habits, loss of appetite  Resp: No shortness of breath with exertion or at rest.  No excess mucus, no productive cough,  No non-productive cough,  No coughing up of blood.  No change in color of mucus.      Skin: no rash or lesions.  GU: no dysuria, change in color of urine, no urgency or frequency.  No flank pain.  MS:  No joint pain or swelling.  No decreased range of motion.  No back pain.  Psych:  No change in mood or affect. No depression or anxiety.  No memory loss. '     Objective:   Physical Exam General- Alert, Oriented, Affect-appropriate, Distress- none acute  But reports  headache Skin- rash-none, lesions- none, excoriation- none  Lymphadenopathy- none  Head- atraumatic  Eyes- Gross vision intact, PERRLA, conjunctivae clear, secretions  Ears- Hearing, canals, Tm- normal  Nose- Clear, No- Septal dev, mucus, polyps, erosion, perforation   Throat- Mallampati III , mucosa clear , drainage- none, tonsils-  Neck- flexible , trachea midline, no stridor , thyroid nl, carotid no bruit  Chest - symmetrical excursion , unlabored     Heart/CV- RRR , no murmur , no gallop  , no rub, nl s1 s2                     - JVD- none , edema- none, stasis changes- none, varices- none     Lung- clear to P&A, wheeze- none, cough- none , dullness-none, rub- none     Chest wall-   Abd- tender-no, distended-no, bowel sounds-present, HSM- no  Br/ Gen/ Rectal- Not done, not indicated  Extrem- cyanosis- none, clubbing, none, atrophy- none, strength- nl  Neuro- grossly intact to observation         Assessment & Plan:

## 2011-01-11 NOTE — Assessment & Plan Note (Addendum)
Currently control seems good We will renew her Advair script

## 2011-01-11 NOTE — Patient Instructions (Signed)
I suggest you see your primary doctor about your headache. Ask him about referral to a headache specialist.   Refill script for Advair  Continue allergy vaccine

## 2011-01-11 NOTE — Assessment & Plan Note (Signed)
She will continue to work with allergy lab on vaccine dosing, but she is satisfied that the shots help.

## 2011-01-11 NOTE — Assessment & Plan Note (Addendum)
Probable analgesic induced headache. I asked her to see her primary doctor. i don't think this is an infection, but agreed to send a Zpak to see if it is acting like a sinusitis.

## 2011-01-20 ENCOUNTER — Ambulatory Visit (INDEPENDENT_AMBULATORY_CARE_PROVIDER_SITE_OTHER): Payer: Medicaid Other

## 2011-01-20 ENCOUNTER — Telehealth: Payer: Self-pay | Admitting: Internal Medicine

## 2011-01-20 DIAGNOSIS — J309 Allergic rhinitis, unspecified: Secondary | ICD-10-CM

## 2011-01-20 NOTE — Telephone Encounter (Signed)
LMTCB

## 2011-01-21 ENCOUNTER — Other Ambulatory Visit: Payer: Self-pay | Admitting: Internal Medicine

## 2011-01-21 MED ORDER — PREDNISONE 10 MG PO TABS: ORAL_TABLET | ORAL | Status: DC

## 2011-01-21 NOTE — Telephone Encounter (Signed)
Called, spoke with pt.  She was informed CDY has probably talked with her about xolair in the past but he will first need her to take a pred taper and come in for OV.  She is aware to take the prednisone as directed below per CDY.  OV scheduled for Aug 3 to discuss this with CDY.  She verbalized understanding of these instructions, is aware pred taper sent to [facility], and will call back if anything further is needed prior to OV.

## 2011-01-21 NOTE — Telephone Encounter (Signed)
I spoke with the pt and she states she is still having chest heaviness and tightness and she states Dr. Maple Hudson mentioned an "asthma shot" at her last OV and she would like to try this. Sh also c/o getting SOB and having a cough when she tries to have a long conversation. SHe states these symptoms have been going on or "a while" and nothing seems to help.  She states she does not feel like her inhalers are working well enough for her.  I do not see any mention of Xolair this in the last note. Please advise. Carron Curie, CMA

## 2011-01-21 NOTE — Telephone Encounter (Signed)
We may have talked briefly about Xoalir. I would need to document trial of prednisone, so will ask you to send script for prednisone taper: Predn 10 mg, # 20,    4 X 2 DAYS, 3 X 2 DAYS, 2 X 2 DAYS, 1 X 2 DAYS  Then please work with Florentina Addison to find a return visit for her- not emergency.

## 2011-01-21 NOTE — Telephone Encounter (Signed)
[phone] pt returning call [place] Cloud

## 2011-01-26 ENCOUNTER — Ambulatory Visit (INDEPENDENT_AMBULATORY_CARE_PROVIDER_SITE_OTHER): Payer: Medicaid Other

## 2011-01-26 ENCOUNTER — Telehealth: Payer: Self-pay | Admitting: Internal Medicine

## 2011-01-26 DIAGNOSIS — J309 Allergic rhinitis, unspecified: Secondary | ICD-10-CM

## 2011-01-26 NOTE — Telephone Encounter (Signed)
Clarinex APPROVED from 01/26/2011 to 01/26/2012. Pharmacy and pt notified.

## 2011-01-29 ENCOUNTER — Other Ambulatory Visit: Payer: Self-pay | Admitting: Internal Medicine

## 2011-02-04 ENCOUNTER — Ambulatory Visit (INDEPENDENT_AMBULATORY_CARE_PROVIDER_SITE_OTHER): Payer: Medicaid Other

## 2011-02-04 DIAGNOSIS — J309 Allergic rhinitis, unspecified: Secondary | ICD-10-CM

## 2011-02-05 ENCOUNTER — Encounter: Payer: Self-pay | Admitting: Internal Medicine

## 2011-02-05 ENCOUNTER — Ambulatory Visit (INDEPENDENT_AMBULATORY_CARE_PROVIDER_SITE_OTHER): Payer: Medicaid Other

## 2011-02-05 ENCOUNTER — Ambulatory Visit (INDEPENDENT_AMBULATORY_CARE_PROVIDER_SITE_OTHER): Payer: Medicaid Other | Admitting: Internal Medicine

## 2011-02-05 VITALS — BP 110/74 | HR 77 | Ht 60.0 in | Wt 187.0 lb

## 2011-02-05 DIAGNOSIS — J309 Allergic rhinitis, unspecified: Secondary | ICD-10-CM

## 2011-02-05 DIAGNOSIS — J301 Allergic rhinitis due to pollen: Secondary | ICD-10-CM

## 2011-02-05 DIAGNOSIS — J45909 Unspecified asthma, uncomplicated: Secondary | ICD-10-CM

## 2011-02-05 MED ORDER — DOXYCYCLINE HYCLATE 100 MG PO TABS: ORAL_TABLET | ORAL | Status: DC

## 2011-02-05 NOTE — Progress Notes (Signed)
Subjective:    Patient ID: Kelly Butler, female    DOB: 08-01-1960, 50 y.o.   MRN: [id number]  HPI    Review of Systems     Objective:   Physical Exam        Assessment & Plan:   Subjective:    Patient ID: Kelly Butler, female    DOB: 1961-03-19, 50 y.o.   MRN: [id number]  HPI 01/11/11- 67 yo F former smoker followed for Asthma, Allergic rhinitis, GERD, complicated by GERD, VCD, chronic headache.     Female companion here Last here June 10., 2011- note reviewed.  Today complains of persistent frontal headache for several days w/o numbness, syncope, vision change. She will address this with her family doctor.  "Sort of stopped up in head" in the mornings, not blowing out anything. At least once it got better for awhile after she came here for allergy shot. We restarted allergy shots - currently at 1:500 GH. Dose adjusted prn by allergy lab for local reaction. She feels the shots help. Some wheeze. Not using her Advair- says no refill. . Uses rescue inhaler or her nebulizer. Says  clarinex worked much better than otc antihistamine.   02/05/11- 01/11/11- 61 yo F former smoker followed for Asthma, Allergic rhinitis, GERD, complicated by GERD, VCD, chronic headache.     2 Female companions (sons?) here She says she is doing better , but that she stays in to avoid summer heat. Antibiotic Z pak  last time opened nose and permitted drainage, and reduced headache. Notes some HA and orthostatic light headedness have returned. Has resumed Advair and says prednisone helped as well. Notes only a little wheeze. Uses nebulizer twice daily and rescue inhaler 3-4 x/ day.  Allergy vaccine does help- Holding at 1:500.  Barefoot in grass made her feet itch badly. Does take claritin.    Review of Systems Constitutional:   No weight loss, night sweats,  Fevers, chills, fatigue, lassitude. HEENT:   No,  Difficulty swallowing,  Tooth/dental problems,  Sore throat,                No sneezing, itching, ear ache,  nasal congestion, post nasal drip,   CV:  No chest pain,  Orthopnea, PND, swelling in lower extremities, anasarca, dizziness, palpitations  GI  No heartburn, indigestion, abdominal pain, nausea, vomiting, diarrhea, change in bowel habits, loss of appetite  Resp: No shortness of breath with exertion or at rest.  No excess mucus, no productive cough,  No non-productive cough,  No coughing up of blood.  No change in color of mucus.      Skin: no rash or lesions.  GU: no dysuria, change in color of urine, no urgency or frequency.  No flank pain.  MS:  No joint pain or swelling.  No decreased range of motion.  No back pain.  Psych:  No change in mood or affect. No depression or anxiety.  No memory loss. '     Objective:   Physical Exam General- Alert, Oriented, Affect-appropriate, Distress- none acute  overweight Skin- rash-none, lesions- none, excoriation- none Lymphadenopathy- none Head- atraumatic            Eyes- Gross vision intact, PERRLA, conjunctivae clear secretions            Ears- Hearing, canals normal            Nose- Clear, No-Septal dev, mucus, polyps, erosion, perforation  Throat- Mallampati III , mucosa clear , drainage- none, tonsils- atrophic Neck- flexible , trachea midline, no stridor , thyroid nl, carotid no bruit Chest - symmetrical excursion , unlabored           Heart/CV- RRR , no murmur , no gallop  , no rub, nl s1 s2                           - JVD- none , edema- none, stasis changes- none, varices- none           Lung- clear to P&A, wheeze- none, cough- none , dullness-none, rub- none           Chest wall-  Abd- tender-no, distended-no, bowel sounds-present, HSM- no Br/ Gen/ Rectal- Not done, not indicated Extrem- cyanosis- none, clubbing, none, atrophy- none, strength- nl Neuro- grossly intact to observation         Assessment & Plan:

## 2011-02-05 NOTE — Patient Instructions (Addendum)
Order lab- allergy profile  Sent script for doxycycline for sinusitis  Continue present treatment for now.

## 2011-02-05 NOTE — Assessment & Plan Note (Addendum)
Holding at 1:500= best tolerated dose She seemed to improve after last antibiotic, raising possibility that longer course could make a lasting improvement in her complaints of headache and nasal congestion.

## 2011-02-05 NOTE — Assessment & Plan Note (Signed)
Controlled, but requiring a lot of bronchodilator therapy. We may eventually want to consider Xolair. i will continue present meds for now, but check IgE/ allergy profile

## 2011-02-09 ENCOUNTER — Other Ambulatory Visit: Payer: Self-pay | Admitting: Internal Medicine

## 2011-02-11 ENCOUNTER — Other Ambulatory Visit: Payer: Medicaid Other

## 2011-02-11 ENCOUNTER — Ambulatory Visit (INDEPENDENT_AMBULATORY_CARE_PROVIDER_SITE_OTHER): Payer: Medicaid Other

## 2011-02-11 ENCOUNTER — Emergency Department (HOSPITAL_COMMUNITY)
Admission: EM | Admit: 2011-02-11 | Discharge: 2011-02-11 | Disposition: A | Discharge status: Home / Self Care | Payer: Medicaid Other | Attending: Emergency Medicine | Admitting: Emergency Medicine

## 2011-02-11 ENCOUNTER — Telehealth: Payer: Self-pay | Admitting: *Deleted

## 2011-02-11 DIAGNOSIS — IMO0001 Reserved for inherently not codable concepts without codable children: Secondary | ICD-10-CM | POA: Insufficient documentation

## 2011-02-11 DIAGNOSIS — R609 Edema, unspecified: Secondary | ICD-10-CM | POA: Insufficient documentation

## 2011-02-11 DIAGNOSIS — M7989 Other specified soft tissue disorders: Secondary | ICD-10-CM | POA: Insufficient documentation

## 2011-02-11 DIAGNOSIS — J301 Allergic rhinitis due to pollen: Secondary | ICD-10-CM

## 2011-02-11 DIAGNOSIS — J309 Allergic rhinitis, unspecified: Secondary | ICD-10-CM

## 2011-02-11 DIAGNOSIS — I10 Essential (primary) hypertension: Secondary | ICD-10-CM | POA: Insufficient documentation

## 2011-02-11 LAB — POCT I-STAT, CHEM 8
BUN: 7 mg/dL (ref 6–23)
Calcium, Ion: 1.22 mmol/L (ref 1.12–1.32)
Chloride: 104 mEq/L (ref 96–112)
Creatinine, Ser: 0.7 mg/dL (ref 0.50–1.10)
Glucose, Bld: 90 mg/dL (ref 70–99)
TCO2: 26 mmol/L (ref 0–100)

## 2011-02-11 NOTE — Telephone Encounter (Signed)
Kelly Butler came in this morning saying you didn't realize she was on maintenance already. I'm sorry I didn't have you a copy of her shot record. I forgot. She came to get her shot the day she saw you that was 0.8 of 1:500. Today she got 0.05 of 1:50.

## 2011-02-12 LAB — ALLERGY PROFILE REGION II-DC, DE, MD, ~~LOC~~, VA
Alternaria Alternata: <0.1 kU/L (ref <0.35)
Aspergillus fumigatus, IgG: 0.19 kU/L (ref <0.35)
Box Elder IgE: 0.24 kU/L (ref <0.35)
Cat Dander: 1.01 kU/L — ABNORMAL HIGH (ref <0.35)
D. farinae: 0.44 kU/L — ABNORMAL HIGH (ref <0.35)
Dog Dander: 0.29 kU/L (ref <0.35)
Elm IgE: 0.17 kU/L (ref <0.35)
Oak: 0.21 kU/L (ref <0.35)
Pecan/Hickory Tree IgE: 0.15 kU/L (ref <0.35)

## 2011-02-16 ENCOUNTER — Ambulatory Visit (INDEPENDENT_AMBULATORY_CARE_PROVIDER_SITE_OTHER): Payer: Medicaid Other

## 2011-02-16 DIAGNOSIS — J309 Allergic rhinitis, unspecified: Secondary | ICD-10-CM

## 2011-02-17 ENCOUNTER — Other Ambulatory Visit: Payer: Self-pay | Admitting: Internal Medicine

## 2011-02-17 ENCOUNTER — Encounter: Payer: Self-pay | Admitting: Internal Medicine

## 2011-02-17 NOTE — Telephone Encounter (Signed)
Noted- vaccine now at 0.5/ 1:50 maintenance

## 2011-02-19 NOTE — Telephone Encounter (Signed)
Please advise if okay to refill Doxy Rx. Thanks.

## 2011-02-22 NOTE — Telephone Encounter (Signed)
Ok to refill these. Refill the Doxy once.

## 2011-02-25 ENCOUNTER — Ambulatory Visit (INDEPENDENT_AMBULATORY_CARE_PROVIDER_SITE_OTHER): Payer: Medicaid Other

## 2011-02-25 ENCOUNTER — Telehealth: Payer: Self-pay | Admitting: Internal Medicine

## 2011-02-25 DIAGNOSIS — J309 Allergic rhinitis, unspecified: Secondary | ICD-10-CM

## 2011-02-25 NOTE — Telephone Encounter (Signed)
[email or web address].Jennifer Castillo, CMA  

## 2011-02-26 MED ORDER — MONTELUKAST SODIUM 10 MG PO TABS: 10.0000 mg | ORAL_TABLET | Freq: Every day | ORAL | Status: DC

## 2011-02-26 NOTE — Telephone Encounter (Signed)
Please send prednisone 10 mg # 15 to take 10 mg every other day, refill x 1.

## 2011-02-26 NOTE — Telephone Encounter (Signed)
Called # provided above - [email or web address] 

## 2011-02-26 NOTE — Telephone Encounter (Signed)
LMTCB

## 2011-02-26 NOTE — Telephone Encounter (Signed)
Spoke with the pt and she states since finishing prednisone her SOB has gotten worse again. She states she cannot complete a sentence without stopping and taking a breath first. She states she knows that Dr. Maple Hudson did not want to put her on more prednisone, but she did feel better while on it. Pt also requests a refill on singulair which I sent. Please advsie on SOB. Carron Curie, CMA Allergies  Allergen Reactions  . Pregabalin     *LYRICA* REACTION: itching, swelling

## 2011-03-01 MED ORDER — PREDNISONE 10 MG PO TABS: 10.0000 mg | ORAL_TABLET | ORAL | Status: AC

## 2011-03-01 NOTE — Telephone Encounter (Signed)
Spoke with patient regarding her lab work and was able to complete this phone message as well. Pt requests we send Rx to [facility] [address]. Done.

## 2011-03-01 NOTE — Progress Notes (Signed)
Quick Note:  Pt aware of results. ______ 

## 2011-03-05 ENCOUNTER — Ambulatory Visit (INDEPENDENT_AMBULATORY_CARE_PROVIDER_SITE_OTHER): Payer: Medicaid Other

## 2011-03-05 ENCOUNTER — Telehealth: Payer: Self-pay | Admitting: *Deleted

## 2011-03-05 DIAGNOSIS — J309 Allergic rhinitis, unspecified: Secondary | ICD-10-CM

## 2011-03-05 NOTE — Telephone Encounter (Signed)
There is no pressure to increase her dose. Recommend she stay at 0.15 ml/ vial until I see her next in the office.

## 2011-03-05 NOTE — Telephone Encounter (Signed)
Pt was given 0.45ml of her allergy vaccine-last injection did well on .15 and we are trying to gently increase patient. Pt called to let us know she had a local reaction-red and itchy.  I informed patient to take her antihistamine and use compresses on her arm; if got worse or changes in her breathing to go to closest ER. I have documented this in her vaccine history sheet in shot room.

## 2011-03-09 ENCOUNTER — Ambulatory Visit (INDEPENDENT_AMBULATORY_CARE_PROVIDER_SITE_OTHER): Payer: Medicaid Other

## 2011-03-09 DIAGNOSIS — J309 Allergic rhinitis, unspecified: Secondary | ICD-10-CM

## 2011-03-09 DIAGNOSIS — Z23 Encounter for immunization: Secondary | ICD-10-CM

## 2011-03-09 NOTE — Telephone Encounter (Signed)
Noted and discussed with Dimas Millin in allergy lab as well as documented in vaccine chart. Also, pt is aware we are holding vaccine at 0.15 until Nov 2012 appt with CY to discuss then.

## 2011-03-10 ENCOUNTER — Ambulatory Visit: Payer: Medicaid Other

## 2011-03-10 DIAGNOSIS — Z23 Encounter for immunization: Secondary | ICD-10-CM

## 2011-03-19 ENCOUNTER — Ambulatory Visit (INDEPENDENT_AMBULATORY_CARE_PROVIDER_SITE_OTHER): Payer: Medicaid Other

## 2011-03-19 DIAGNOSIS — J309 Allergic rhinitis, unspecified: Secondary | ICD-10-CM

## 2011-03-23 ENCOUNTER — Other Ambulatory Visit: Payer: Self-pay | Admitting: Obstetrics

## 2011-03-23 ENCOUNTER — Ambulatory Visit (INDEPENDENT_AMBULATORY_CARE_PROVIDER_SITE_OTHER): Payer: Medicaid Other

## 2011-03-23 DIAGNOSIS — J309 Allergic rhinitis, unspecified: Secondary | ICD-10-CM

## 2011-03-23 DIAGNOSIS — Z1231 Encounter for screening mammogram for malignant neoplasm of breast: Secondary | ICD-10-CM

## 2011-03-25 ENCOUNTER — Telehealth: Payer: Self-pay | Admitting: *Deleted

## 2011-03-25 NOTE — Telephone Encounter (Signed)
Kelly Butler came in for her shot earlier this wk.. She is itching this time because she's not getting enough vac..I repeated the 0.15 and told her I would need to ask you since you wanted her at that dose until she sees you in November. Please advise.

## 2011-03-26 NOTE — Telephone Encounter (Signed)
Ok to resume slow build as tolerated.

## 2011-03-30 ENCOUNTER — Ambulatory Visit: Payer: Medicaid Other

## 2011-04-01 ENCOUNTER — Ambulatory Visit (INDEPENDENT_AMBULATORY_CARE_PROVIDER_SITE_OTHER): Payer: Medicaid Other

## 2011-04-01 DIAGNOSIS — J309 Allergic rhinitis, unspecified: Secondary | ICD-10-CM

## 2011-04-02 ENCOUNTER — Ambulatory Visit
Admission: RE | Admit: 2011-04-02 | Discharge: 2011-04-02 | Disposition: A | Discharge status: Home / Self Care | Payer: Medicaid Other | Source: Ambulatory Visit | Attending: Obstetrics | Admitting: Obstetrics

## 2011-04-02 DIAGNOSIS — Z1231 Encounter for screening mammogram for malignant neoplasm of breast: Secondary | ICD-10-CM

## 2011-04-05 LAB — URINALYSIS, ROUTINE W REFLEX MICROSCOPIC
Ketones, ur: NEGATIVE
Nitrite: NEGATIVE
Specific Gravity, Urine: 1.021
pH: 6

## 2011-04-07 ENCOUNTER — Ambulatory Visit (INDEPENDENT_AMBULATORY_CARE_PROVIDER_SITE_OTHER): Payer: Medicaid Other

## 2011-04-07 DIAGNOSIS — J309 Allergic rhinitis, unspecified: Secondary | ICD-10-CM

## 2011-04-09 ENCOUNTER — Telehealth: Payer: Self-pay | Admitting: *Deleted

## 2011-04-09 MED ORDER — PREDNISONE 10 MG PO TABS: ORAL_TABLET | ORAL | Status: DC

## 2011-04-09 NOTE — Telephone Encounter (Signed)
Sorry,I gave you the wrong info.on the neb.treatments.They last for 4to6 hrs.,she has to use her nebulizer 3 to 4 times a day.

## 2011-04-09 NOTE — Telephone Encounter (Signed)
Called and spoke with patient to check on how she was feeling; stated she is still having SOB consistent most of the time, dry cough-mainly last night,wheezing slightly -was taking breathing treatment at this time. Denies any fever or chills.   Per CY-okay to give patient Prednisone 10mg  #20 take 4 x 2 days, 3 x 2 days, 2 x 2 days, 1 x 2 days, then stop. No refills.

## 2011-04-09 NOTE — Telephone Encounter (Signed)
Dr.Young ,                   I'm sorry I'm just now getting this to you.Please apologize to Kelly Butler for me. She came in [place].for her allergy shot;she's had wheezing,sob,chest pain & pain in between her shoulder blades increases when she can't get her breath. She doesn't have enough breath to finish her sentences.Excretion makes it worse(walking,cleaning). Neb treatments last 3 to 4 hrs.,rescue inh. Only last 30 or 45 mins..Please advise.

## 2011-04-12 ENCOUNTER — Ambulatory Visit (INDEPENDENT_AMBULATORY_CARE_PROVIDER_SITE_OTHER): Payer: Medicaid Other

## 2011-04-12 DIAGNOSIS — J309 Allergic rhinitis, unspecified: Secondary | ICD-10-CM

## 2011-04-23 ENCOUNTER — Telehealth: Payer: Self-pay | Admitting: Internal Medicine

## 2011-04-23 ENCOUNTER — Ambulatory Visit (INDEPENDENT_AMBULATORY_CARE_PROVIDER_SITE_OTHER): Payer: Medicaid Other

## 2011-04-23 DIAGNOSIS — J309 Allergic rhinitis, unspecified: Secondary | ICD-10-CM

## 2011-04-23 NOTE — Telephone Encounter (Signed)
Called # provided above - [phone] - [email or web address]

## 2011-04-26 NOTE — Telephone Encounter (Signed)
lmomtcb  

## 2011-04-26 NOTE — Telephone Encounter (Signed)
Suggest we send prednisone 20 mg, to take 1 daily x 1 week, then 1/2 daily til her November appointment.

## 2011-04-26 NOTE — Telephone Encounter (Signed)
Called, spoke with pt.  States "it is getting harder to breath."  States she is getting SOB quicker with activities - has been going on x couple of months.  Also, has some coughing - dry, wheezing, chest tightness, and chills and sweats at times.  Using albuterol HFA 3-4 times daily and albuterol neb at least 2-3 times daily.  States the albuterol nebs calm the symptoms down but then they restart.  Also, currently taking prednisone taper - will take 2 tablets today then decrease to 1 tablet tomorrow.  Allergies verified.  [facility].  Dr. Maple Hudson, pls advise. Thanks!   Allergies  Allergen Reactions  . Pregabalin     *LYRICA* REACTION: itching, swelling

## 2011-04-27 NOTE — Telephone Encounter (Signed)
lmomtcb  

## 2011-04-28 ENCOUNTER — Ambulatory Visit (INDEPENDENT_AMBULATORY_CARE_PROVIDER_SITE_OTHER): Payer: Medicaid Other

## 2011-04-28 DIAGNOSIS — J309 Allergic rhinitis, unspecified: Secondary | ICD-10-CM

## 2011-04-29 NOTE — Telephone Encounter (Signed)
Advised pt of CDY's recs. Pt verbalized understanding

## 2011-05-01 ENCOUNTER — Other Ambulatory Visit: Payer: Self-pay | Admitting: Internal Medicine

## 2011-05-03 NOTE — Telephone Encounter (Signed)
Please advise if okay to refill. Thanks.  

## 2011-05-03 NOTE — Telephone Encounter (Signed)
OK to refill doxycycline 

## 2011-05-13 ENCOUNTER — Ambulatory Visit (INDEPENDENT_AMBULATORY_CARE_PROVIDER_SITE_OTHER): Payer: Medicaid Other

## 2011-05-13 ENCOUNTER — Encounter: Payer: Self-pay | Admitting: Internal Medicine

## 2011-05-13 ENCOUNTER — Ambulatory Visit (INDEPENDENT_AMBULATORY_CARE_PROVIDER_SITE_OTHER): Payer: Medicaid Other | Admitting: Internal Medicine

## 2011-05-13 VITALS — BP 112/66 | HR 79 | Ht 60.0 in | Wt 196.0 lb

## 2011-05-13 DIAGNOSIS — J301 Allergic rhinitis due to pollen: Secondary | ICD-10-CM

## 2011-05-13 DIAGNOSIS — M2550 Pain in unspecified joint: Secondary | ICD-10-CM

## 2011-05-13 DIAGNOSIS — J309 Allergic rhinitis, unspecified: Secondary | ICD-10-CM

## 2011-05-13 DIAGNOSIS — M797 Fibromyalgia: Secondary | ICD-10-CM

## 2011-05-13 DIAGNOSIS — IMO0001 Reserved for inherently not codable concepts without codable children: Secondary | ICD-10-CM

## 2011-05-13 DIAGNOSIS — J45909 Unspecified asthma, uncomplicated: Secondary | ICD-10-CM

## 2011-05-13 MED ORDER — BUDESONIDE-FORMOTEROL FUMARATE 160-4.5 MCG/ACT IN AERO: 2.0000 | INHALATION_SPRAY | Freq: Two times a day (BID) | RESPIRATORY_TRACT | Status: DC

## 2011-05-13 MED ORDER — AEROCHAMBER MV MISC: Status: AC

## 2011-05-13 MED ORDER — EPINEPHRINE 0.3 MG/0.3ML IJ DEVI: 0.3000 mg | Freq: Once | INTRAMUSCULAR | Status: DC

## 2011-05-13 MED ORDER — FLUTICASONE PROPIONATE 50 MCG/ACT NA SUSP: 2.0000 | Freq: Every day | NASAL | Status: DC

## 2011-05-13 MED ORDER — ALBUTEROL SULFATE (2.5 MG/3ML) 0.083% IN NEBU: 2.5000 mg | INHALATION_SOLUTION | Freq: Four times a day (QID) | RESPIRATORY_TRACT | Status: DC | PRN

## 2011-05-13 MED ORDER — MONTELUKAST SODIUM 10 MG PO TABS: 10.0000 mg | ORAL_TABLET | Freq: Every day | ORAL | Status: DC

## 2011-05-13 MED ORDER — ALBUTEROL SULFATE HFA 108 (90 BASE) MCG/ACT IN AERS: 2.0000 | INHALATION_SPRAY | RESPIRATORY_TRACT | Status: DC | PRN

## 2011-05-13 NOTE — Progress Notes (Signed)
Patient ID: Kelly Butler, female    DOB: 10/20/1960, 50 y.o.   MRN: [id number]  HPI 01/11/11- 50 yo F former smoker followed for Asthma, Allergic rhinitis, GERD, complicated by GERD, VCD, chronic headache.     Female companion here Last here June 10., 2011- note reviewed.  Today complains of persistent frontal headache for several days w/o numbness, syncope, vision change. She will address this with her family doctor.  "Sort of stopped up in head" in the mornings, not blowing out anything. At least once it got better for awhile after she came here for allergy shot. We restarted allergy shots - currently at 1:500 GH. Dose adjusted prn by allergy lab for local reaction. She feels the shots help. Some wheeze. Not using her Advair- says no refill. . Uses rescue inhaler or her nebulizer. Says  clarinex worked much better than otc antihistamine.   02/05/11- 5 yo F former smoker followed for Asthma, Allergic rhinitis, GERD, complicated by GERD, VCD, chronic headache.     2 Female companions (sons?) here She says she is doing better , but that she stays in to avoid summer heat. Antibiotic Z pak  last time opened nose and permitted drainage, and reduced headache. Notes some HA and orthostatic light headedness have returned. Has resumed Advair and says prednisone helped as well. Notes only a little wheeze. Uses nebulizer twice daily and rescue inhaler 3-4 x/ day.  Allergy vaccine does help- Holding at 1:500.  Barefoot in grass made her feet itch badly. Does take claritin.    05/13/11- 50 yo F former smoker followed for Asthma, Allergic rhinitis, GERD, complicated by GERD, VCD, chronic headache.   Has had flu vaccine. Complains of chronic and sustained substernal chest pains, shortness of breath, headaches and fibromyalgia. Finished a prednisone taper yesterday. Occasional left arm aching, numbness and tingling. The chest pain is described as sharp, steady and lasting 5 or 6 minutes at a time. It is unrelated to  exertion or position but she definitely feels reflux. Asthma comes and goes. Nebulizer machine helps the most. At night she will toss and turn, can't sleep, uncomfortable. She continues allergy vaccine.  Review of Systems-see HPI Constitutional:   +   weight loss, night sweats, fevers, chills, fatigue, lassitude. HEENT:   +  headaches,  No-difficulty swallowing, tooth/dental problems, sore throat,       No-  sneezing, itching, ear ache, +nasal congestion, post nasal drip,  CV:  +atypical chest pain,  No-orthopnea, PND, swelling in lower extremities, anasarca,                                  dizziness, palpitations Resp: No-   shortness of breath with exertion or at rest.              No-   productive cough,  No non-productive cough,  No- coughing up of blood.              No-   change in color of mucus.  No- wheezing.   Skin: No-   rash or lesions. GI:  No-   heartburn, indigestion, abdominal pain, nausea, vomiting, diarrhea,                 change in bowel habits, loss of appetite GU: No-   dysuria, change in color of urine, no urgency or frequency.  No- flank pain. MS:  +  joint pain  or swelling.  No- decreased range of motion.  No- back pain. Neuro-     nothing unusual Psych:  No- change in mood or affect. No depression or anxiety.  No memory loss.      Objective:   Physical Exam General- Alert, Oriented, Affect-appropriate, Distress- none acute  overweight Skin- rash-none, lesions- none, excoriation- none Lymphadenopathy- none Head- atraumatic            Eyes- Gross vision intact, PERRLA, conjunctivae clear secretions            Ears- Hearing, canals normal            Nose- Clear, No-Septal dev, mucus, polyps, erosion, perforation             Throat- Mallampati III ,  drainage- none, tonsils- atrophic. Thrush. Neck- flexible , trachea midline, no stridor , thyroid nl, carotid no bruit Chest - symmetrical excursion , unlabored           Heart/CV- RRR , no murmur , no gallop  , no  rub, nl s1 s2                           - JVD- none , edema- none, stasis changes- none, varices- none           Lung- clear to P&A, wheeze- trace, cough- none , dullness-none, rub- none           Chest wall-  Abd- tender-no, distended-no, bowel sounds-present, HSM- no Br/ Gen/ Rectal- Not done, not indicated Extrem- cyanosis- none, clubbing, none, atrophy- none, strength- nl Neuro- grossly intact to observation

## 2011-05-13 NOTE — Patient Instructions (Addendum)
Order- referral to Rheumatologist Dr Pollyann Savoy for arthralgias and hx fibromyalgia  Stop Advair  Sample/ script Symbicort 160    2 puffs through an aerochamber spacer tube. Twice every day   Rinse mouth.

## 2011-05-17 NOTE — Assessment & Plan Note (Signed)
She did experience thrush from Advair. We will change her to Symbicort with an AeroChamber and reemphasis on mouth care.

## 2011-05-17 NOTE — Assessment & Plan Note (Signed)
She is building allergy vaccine slowly as tolerated.

## 2011-05-17 NOTE — Assessment & Plan Note (Signed)
She gives a history of depression and somatic discomfort with a previous diagnosis of fibromyalgia. I think this significantly impacts her ability to cope with her allergy and asthma problems. I have suggested referral for musculoskeletal evaluation.

## 2011-05-21 ENCOUNTER — Ambulatory Visit (INDEPENDENT_AMBULATORY_CARE_PROVIDER_SITE_OTHER): Payer: Medicaid Other

## 2011-05-21 DIAGNOSIS — J309 Allergic rhinitis, unspecified: Secondary | ICD-10-CM

## 2011-05-28 ENCOUNTER — Other Ambulatory Visit: Payer: Self-pay | Admitting: Internal Medicine

## 2011-05-28 ENCOUNTER — Ambulatory Visit (INDEPENDENT_AMBULATORY_CARE_PROVIDER_SITE_OTHER): Payer: Medicaid Other

## 2011-05-28 DIAGNOSIS — J309 Allergic rhinitis, unspecified: Secondary | ICD-10-CM

## 2011-06-04 ENCOUNTER — Ambulatory Visit (INDEPENDENT_AMBULATORY_CARE_PROVIDER_SITE_OTHER): Payer: Medicaid Other

## 2011-06-04 DIAGNOSIS — J309 Allergic rhinitis, unspecified: Secondary | ICD-10-CM

## 2011-06-11 ENCOUNTER — Ambulatory Visit (INDEPENDENT_AMBULATORY_CARE_PROVIDER_SITE_OTHER): Payer: Medicaid Other

## 2011-06-11 DIAGNOSIS — J309 Allergic rhinitis, unspecified: Secondary | ICD-10-CM

## 2011-06-18 ENCOUNTER — Ambulatory Visit (INDEPENDENT_AMBULATORY_CARE_PROVIDER_SITE_OTHER): Payer: Medicaid Other

## 2011-06-18 DIAGNOSIS — J309 Allergic rhinitis, unspecified: Secondary | ICD-10-CM

## 2011-06-25 ENCOUNTER — Other Ambulatory Visit (HOSPITAL_COMMUNITY): Payer: Self-pay | Admitting: Obstetrics

## 2011-06-25 DIAGNOSIS — R140 Abdominal distension (gaseous): Secondary | ICD-10-CM

## 2011-07-02 ENCOUNTER — Ambulatory Visit (HOSPITAL_COMMUNITY): Admission: RE | Admit: 2011-07-02 | Payer: Medicaid Other | Source: Ambulatory Visit

## 2011-07-09 ENCOUNTER — Ambulatory Visit: Payer: Medicaid Other | Admitting: Family Medicine

## 2011-07-13 ENCOUNTER — Ambulatory Visit (HOSPITAL_COMMUNITY): Payer: Medicaid Other

## 2011-07-16 ENCOUNTER — Ambulatory Visit (INDEPENDENT_AMBULATORY_CARE_PROVIDER_SITE_OTHER): Payer: Medicaid Other

## 2011-07-16 ENCOUNTER — Ambulatory Visit (HOSPITAL_COMMUNITY)
Admission: RE | Admit: 2011-07-16 | Discharge: 2011-07-16 | Disposition: A | Discharge status: Home / Self Care | Payer: Medicaid Other | Source: Ambulatory Visit | Attending: Obstetrics | Admitting: Obstetrics

## 2011-07-16 DIAGNOSIS — Q619 Cystic kidney disease, unspecified: Secondary | ICD-10-CM | POA: Insufficient documentation

## 2011-07-16 DIAGNOSIS — R140 Abdominal distension (gaseous): Secondary | ICD-10-CM

## 2011-07-16 DIAGNOSIS — R141 Gas pain: Secondary | ICD-10-CM | POA: Insufficient documentation

## 2011-07-16 DIAGNOSIS — K7689 Other specified diseases of liver: Secondary | ICD-10-CM | POA: Insufficient documentation

## 2011-07-16 DIAGNOSIS — R142 Eructation: Secondary | ICD-10-CM | POA: Insufficient documentation

## 2011-07-16 DIAGNOSIS — J309 Allergic rhinitis, unspecified: Secondary | ICD-10-CM

## 2011-07-22 ENCOUNTER — Ambulatory Visit (INDEPENDENT_AMBULATORY_CARE_PROVIDER_SITE_OTHER): Payer: Medicaid Other

## 2011-07-22 DIAGNOSIS — J309 Allergic rhinitis, unspecified: Secondary | ICD-10-CM

## 2011-07-30 ENCOUNTER — Ambulatory Visit (INDEPENDENT_AMBULATORY_CARE_PROVIDER_SITE_OTHER): Payer: Medicaid Other

## 2011-07-30 DIAGNOSIS — J309 Allergic rhinitis, unspecified: Secondary | ICD-10-CM

## 2011-08-05 ENCOUNTER — Encounter: Payer: Self-pay | Admitting: Internal Medicine

## 2011-08-06 ENCOUNTER — Ambulatory Visit (INDEPENDENT_AMBULATORY_CARE_PROVIDER_SITE_OTHER): Payer: Medicaid Other

## 2011-08-06 DIAGNOSIS — J309 Allergic rhinitis, unspecified: Secondary | ICD-10-CM

## 2011-08-11 ENCOUNTER — Ambulatory Visit: Payer: Medicaid Other | Admitting: Family Medicine

## 2011-08-11 ENCOUNTER — Encounter: Payer: Self-pay | Admitting: Family Medicine

## 2011-08-13 ENCOUNTER — Ambulatory Visit (INDEPENDENT_AMBULATORY_CARE_PROVIDER_SITE_OTHER): Payer: Medicaid Other

## 2011-08-13 DIAGNOSIS — J309 Allergic rhinitis, unspecified: Secondary | ICD-10-CM

## 2011-08-27 ENCOUNTER — Telehealth: Payer: Self-pay | Admitting: Internal Medicine

## 2011-08-27 ENCOUNTER — Ambulatory Visit (INDEPENDENT_AMBULATORY_CARE_PROVIDER_SITE_OTHER): Payer: Medicaid Other

## 2011-08-27 DIAGNOSIS — J309 Allergic rhinitis, unspecified: Secondary | ICD-10-CM

## 2011-08-27 MED ORDER — BUDESONIDE-FORMOTEROL FUMARATE 160-4.5 MCG/ACT IN AERO: 2.0000 | INHALATION_SPRAY | Freq: Two times a day (BID) | RESPIRATORY_TRACT | Status: DC

## 2011-08-27 MED ORDER — EPINEPHRINE 0.3 MG/0.3ML IJ DEVI: 0.3000 mg | Freq: Once | INTRAMUSCULAR | Status: DC

## 2011-08-27 MED ORDER — DESLORATADINE 5 MG PO TABS: 5.0000 mg | ORAL_TABLET | Freq: Every day | ORAL | Status: DC

## 2011-08-27 MED ORDER — ALBUTEROL SULFATE (2.5 MG/3ML) 0.083% IN NEBU: 2.5000 mg | INHALATION_SOLUTION | Freq: Four times a day (QID) | RESPIRATORY_TRACT | Status: DC | PRN

## 2011-08-27 MED ORDER — ALBUTEROL SULFATE HFA 108 (90 BASE) MCG/ACT IN AERS: 2.0000 | INHALATION_SPRAY | RESPIRATORY_TRACT | Status: DC | PRN

## 2011-08-27 MED ORDER — FLUTICASONE PROPIONATE 50 MCG/ACT NA SUSP: 2.0000 | Freq: Every day | NASAL | Status: DC

## 2011-08-27 MED ORDER — MONTELUKAST SODIUM 10 MG PO TABS: 10.0000 mg | ORAL_TABLET | Freq: Every day | ORAL | Status: DC

## 2011-08-27 NOTE — Telephone Encounter (Signed)
LM w/ family member for pt TCB x1--rx's has been sent

## 2011-08-27 NOTE — Telephone Encounter (Signed)
PT CAME IN TO DAY FOR HER ALLERGY SHOT . SHE WAS GAVE .5  OF HER 1:50 Same dose as before with no reaction. Pt returned call today with itching burning at injection site Was told to take antihistamine.PER CY pt to be held at 0.3 of 1:50 until further notice documented  On injection sheet. Pt is aware.

## 2011-08-30 NOTE — Telephone Encounter (Signed)
Pt aware. Heike Pounds, CMA  

## 2011-09-03 ENCOUNTER — Ambulatory Visit (INDEPENDENT_AMBULATORY_CARE_PROVIDER_SITE_OTHER): Payer: Medicaid Other

## 2011-09-03 DIAGNOSIS — J309 Allergic rhinitis, unspecified: Secondary | ICD-10-CM

## 2011-09-10 ENCOUNTER — Ambulatory Visit (INDEPENDENT_AMBULATORY_CARE_PROVIDER_SITE_OTHER): Payer: Medicaid Other

## 2011-09-10 DIAGNOSIS — J309 Allergic rhinitis, unspecified: Secondary | ICD-10-CM

## 2011-09-24 ENCOUNTER — Ambulatory Visit (INDEPENDENT_AMBULATORY_CARE_PROVIDER_SITE_OTHER): Payer: Medicaid Other

## 2011-09-24 DIAGNOSIS — J309 Allergic rhinitis, unspecified: Secondary | ICD-10-CM

## 2011-09-30 ENCOUNTER — Ambulatory Visit (INDEPENDENT_AMBULATORY_CARE_PROVIDER_SITE_OTHER): Payer: Medicaid Other

## 2011-09-30 DIAGNOSIS — J309 Allergic rhinitis, unspecified: Secondary | ICD-10-CM

## 2011-10-04 ENCOUNTER — Encounter: Payer: Self-pay | Admitting: Internal Medicine

## 2011-10-05 ENCOUNTER — Ambulatory Visit (INDEPENDENT_AMBULATORY_CARE_PROVIDER_SITE_OTHER): Payer: Medicaid Other

## 2011-10-05 DIAGNOSIS — J309 Allergic rhinitis, unspecified: Secondary | ICD-10-CM

## 2011-10-13 ENCOUNTER — Ambulatory Visit (INDEPENDENT_AMBULATORY_CARE_PROVIDER_SITE_OTHER): Payer: Medicaid Other

## 2011-10-13 DIAGNOSIS — J309 Allergic rhinitis, unspecified: Secondary | ICD-10-CM

## 2011-11-01 ENCOUNTER — Ambulatory Visit (INDEPENDENT_AMBULATORY_CARE_PROVIDER_SITE_OTHER): Payer: Medicaid Other

## 2011-11-01 DIAGNOSIS — J309 Allergic rhinitis, unspecified: Secondary | ICD-10-CM

## 2011-11-08 ENCOUNTER — Ambulatory Visit (INDEPENDENT_AMBULATORY_CARE_PROVIDER_SITE_OTHER): Payer: Medicaid Other

## 2011-11-08 DIAGNOSIS — J309 Allergic rhinitis, unspecified: Secondary | ICD-10-CM

## 2011-11-19 ENCOUNTER — Ambulatory Visit (INDEPENDENT_AMBULATORY_CARE_PROVIDER_SITE_OTHER): Payer: Medicaid Other

## 2011-11-19 DIAGNOSIS — J309 Allergic rhinitis, unspecified: Secondary | ICD-10-CM

## 2011-11-22 ENCOUNTER — Ambulatory Visit (INDEPENDENT_AMBULATORY_CARE_PROVIDER_SITE_OTHER): Payer: Medicaid Other

## 2011-11-22 DIAGNOSIS — J309 Allergic rhinitis, unspecified: Secondary | ICD-10-CM

## 2011-11-23 ENCOUNTER — Ambulatory Visit: Payer: Medicaid Other | Attending: Orthopedic Surgery | Admitting: Physical Therapy

## 2011-11-23 DIAGNOSIS — M25569 Pain in unspecified knee: Secondary | ICD-10-CM | POA: Insufficient documentation

## 2011-11-23 DIAGNOSIS — Z96659 Presence of unspecified artificial knee joint: Secondary | ICD-10-CM | POA: Insufficient documentation

## 2011-11-23 DIAGNOSIS — IMO0001 Reserved for inherently not codable concepts without codable children: Secondary | ICD-10-CM | POA: Insufficient documentation

## 2011-11-23 DIAGNOSIS — R262 Difficulty in walking, not elsewhere classified: Secondary | ICD-10-CM | POA: Insufficient documentation

## 2011-12-01 ENCOUNTER — Other Ambulatory Visit: Payer: Self-pay | Admitting: *Deleted

## 2011-12-01 ENCOUNTER — Ambulatory Visit (INDEPENDENT_AMBULATORY_CARE_PROVIDER_SITE_OTHER): Payer: Medicaid Other

## 2011-12-01 DIAGNOSIS — J309 Allergic rhinitis, unspecified: Secondary | ICD-10-CM

## 2011-12-01 MED ORDER — MONTELUKAST SODIUM 10 MG PO TABS: 10.0000 mg | ORAL_TABLET | Freq: Every day | ORAL | Status: DC

## 2011-12-01 MED ORDER — DESLORATADINE 5 MG PO TABS: 5.0000 mg | ORAL_TABLET | Freq: Every day | ORAL | Status: DC

## 2011-12-01 MED ORDER — EPINEPHRINE 0.3 MG/0.3ML IJ DEVI: 0.3000 mg | Freq: Once | INTRAMUSCULAR | Status: DC

## 2011-12-07 ENCOUNTER — Ambulatory Visit: Payer: Medicaid Other | Attending: Orthopedic Surgery | Admitting: Physical Therapy

## 2011-12-07 DIAGNOSIS — Z96659 Presence of unspecified artificial knee joint: Secondary | ICD-10-CM | POA: Insufficient documentation

## 2011-12-07 DIAGNOSIS — M25569 Pain in unspecified knee: Secondary | ICD-10-CM | POA: Insufficient documentation

## 2011-12-07 DIAGNOSIS — R262 Difficulty in walking, not elsewhere classified: Secondary | ICD-10-CM | POA: Insufficient documentation

## 2011-12-07 DIAGNOSIS — IMO0001 Reserved for inherently not codable concepts without codable children: Secondary | ICD-10-CM | POA: Insufficient documentation

## 2011-12-10 ENCOUNTER — Ambulatory Visit (INDEPENDENT_AMBULATORY_CARE_PROVIDER_SITE_OTHER): Payer: Medicaid Other

## 2011-12-10 DIAGNOSIS — J309 Allergic rhinitis, unspecified: Secondary | ICD-10-CM

## 2011-12-11 ENCOUNTER — Emergency Department (HOSPITAL_COMMUNITY): Payer: Medicaid Other

## 2011-12-11 ENCOUNTER — Emergency Department (HOSPITAL_COMMUNITY)
Admission: EM | Admit: 2011-12-11 | Discharge: 2011-12-12 | Disposition: A | Discharge status: Home / Self Care | Payer: Medicaid Other | Attending: Emergency Medicine | Admitting: Emergency Medicine

## 2011-12-11 ENCOUNTER — Encounter (HOSPITAL_COMMUNITY): Payer: Self-pay | Admitting: Emergency Medicine

## 2011-12-11 DIAGNOSIS — R0602 Shortness of breath: Secondary | ICD-10-CM | POA: Insufficient documentation

## 2011-12-11 DIAGNOSIS — R109 Unspecified abdominal pain: Secondary | ICD-10-CM | POA: Insufficient documentation

## 2011-12-11 DIAGNOSIS — Z760 Encounter for issue of repeat prescription: Secondary | ICD-10-CM

## 2011-12-11 DIAGNOSIS — M549 Dorsalgia, unspecified: Secondary | ICD-10-CM | POA: Insufficient documentation

## 2011-12-11 DIAGNOSIS — I10 Essential (primary) hypertension: Secondary | ICD-10-CM | POA: Insufficient documentation

## 2011-12-11 DIAGNOSIS — M7989 Other specified soft tissue disorders: Secondary | ICD-10-CM | POA: Insufficient documentation

## 2011-12-11 DIAGNOSIS — R609 Edema, unspecified: Secondary | ICD-10-CM

## 2011-12-11 DIAGNOSIS — J9801 Acute bronchospasm: Secondary | ICD-10-CM

## 2011-12-11 LAB — BASIC METABOLIC PANEL
CO2: 25 mEq/L (ref 19–32)
Calcium: 9 mg/dL (ref 8.4–10.5)
Creatinine, Ser: 0.74 mg/dL (ref 0.50–1.10)
Glucose, Bld: 94 mg/dL (ref 70–99)

## 2011-12-11 LAB — URINALYSIS, ROUTINE W REFLEX MICROSCOPIC
Bilirubin Urine: NEGATIVE
Glucose, UA: NEGATIVE mg/dL
Ketones, ur: NEGATIVE mg/dL
pH: 6 (ref 5.0–8.0)

## 2011-12-11 LAB — CBC
Hemoglobin: 11 g/dL — ABNORMAL LOW (ref 12.0–15.0)
MCHC: 33.1 g/dL (ref 30.0–36.0)
RBC: 3.96 MIL/uL (ref 3.87–5.11)

## 2011-12-11 MED ORDER — ALBUTEROL SULFATE (5 MG/ML) 0.5% IN NEBU
2.5000 mg | INHALATION_SOLUTION | Freq: Once | RESPIRATORY_TRACT | Status: AC
  Administered 2011-12-11: 2.5 mg via RESPIRATORY_TRACT
  Filled 2011-12-11: qty 0.5

## 2011-12-11 MED ORDER — IPRATROPIUM BROMIDE 0.02 % IN SOLN
0.5000 mg | Freq: Once | RESPIRATORY_TRACT | Status: AC
  Administered 2011-12-11: 0.5 mg via RESPIRATORY_TRACT
  Filled 2011-12-11: qty 2.5

## 2011-12-11 NOTE — ED Notes (Signed)
Pt reports swelling in lower extremities and hands that started today and also an increase in her chronic back pain. This swelling in lower extremities has happened three other times and went away after several days without treatment. Reports hx of CHF and HTN.

## 2011-12-11 NOTE — ED Notes (Signed)
Rn request to obtain labs with the start of IV °
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
## 2011-12-12 MED ORDER — FUROSEMIDE 20 MG PO TABS: 20.0000 mg | ORAL_TABLET | Freq: Two times a day (BID) | ORAL | Status: DC

## 2011-12-12 NOTE — Discharge Instructions (Signed)
Bronchospasm, Adult Bronchospasm means that there is a spasm or tightening of the airways going into the lungs. Because the airways go into a spasm and get smaller it makes breathing more difficult. For reasons not completely known, workings (functions) of the airways designed to protect the lungs become over active. This causes the airways to become more sensitive to:  Infection.   Weather.   Exercise.   Irritants.   Things that cause allergic reactions or allergies (allergens).  Frequent coughing or respiratory episodes should be checked for the cause. This condition may be made worse by exercise. CAUSES  Inflammation is often the cause of this condition. Allergy, viral respiratory infections, or irritants in the air often cause this problem. Allergic reactions produce immediate and delayed responses. Late reactions may produce more serious inflammation. This may lead to increased reactivity of the airways. Sometimes this is inherited. Some common triggers are:  Allergies.   Infection commonly triggers attacks. Antibiotics are not helpful for viral infections and usually do not help with attacks of bronchospasm.   Exercise (running, etc.) can trigger an attack. Proper pre-exercise medications help most individuals participate in sports. Swimming is the least likely sport to cause problems.   Irritants (for example, pollution, cigarette smoke, strong odors, aerosol sprays, paint fumes, etc.) may trigger attacks. You cannot smoke and do not allow smoking in your home. This is absolutely necessary. Show this instruction to mates, relatives and significant others that may not agree with you.   Weather changes may cause lung problems but moving around trying to find an ideal climate does not seem to be overly helpful. Winds increase molds and pollens in the air. Rain refreshes the air by washing irritants out. Cold air may cause irritation.   Emotional problems do not cause lung problems but  can trigger attacks.  SYMPTOMS  Wheezing is the most common symptom. Frequent coughing (with or without exercise and or crying) and repeated respiratory infections are all early warning signs of bronchospasm. Chest tightness and shortness of breath are other symptoms. DIAGNOSIS  Early hidden bronchospasm may go for long periods of time without being detected. This is especially true if wheezing cannot be detected by your caregiver. Lung (pulmonary) function studies may help with diagnosis in these cases. HOME CARE INSTRUCTIONS   It is necessary to remain calm during an attack. Try to relax and breathe more slowly. During this time medications may be given. If any breathing problems seem to be getting worse and are unresponsive to treatment seek immediate medical care.   If you have severe breathing difficulty or have had a life threatening attack it is probably a good idea for you to learn how to give adrenaline (epi-pen) or use an anaphylaxis kit. Your caregiver can help you with this. These are the same kits carried by people who have severe allergic reactions. This is especially important if you do not have readily accessible medical care.   With any severe breathing problems where epinephrine (adrenaline) has been given at home call 911 immediately as the delayed reaction may be even more severe.  SEEK MEDICAL CARE IF:   There is wheezing and shortness of breath, even if medications are given to prevent attacks.   An oral temperature above 102 F (38.9 C) develops.   There are muscle aches, chest pain, or thickening of sputum.   The sputum changes from clear or white to yellow, green, gray, or bloody.   There are problems that may be related to   the medicine you are given, such as a rash, itching, swelling, or trouble breathing.  SEEK IMMEDIATE MEDICAL CARE IF:   The usual medicines do not stop your wheezing, or there is increased coughing.   You have increased difficulty breathing.    MAKE SURE YOU:   Understand these instructions.   Will watch your condition.   Will get help right away if you are not doing well or get worse.  Document Released: 06/24/2003 Document Revised: 06/10/2011 Document Reviewed: 02/07/2008 [facility] Patient Information 2012 [place], [place] .Edema Edema is an abnormal build-up of fluids in tissues. Because this is partly dependent on gravity (water flows to the lowest place), it is more common in the leg sand thighs (lower extremities). It is also common in the looser tissues, like around the eyes. Painless swelling of the feet and ankles is common and increases as a person ages. It may affect both legs and may include the calves or even thighs. When squeezed, the fluid may move out of the affected area and may leave a dent for a few moments. CAUSES   Prolonged standing or sitting in one place for extended periods of time. Movement helps pump tissue fluid into the veins, and absence of movement prevents this, resulting in edema.   Varicose veins. The valves in the veins do not work as well as they should. This causes fluid to leak into the tissues.   Fluid and salt overload.   Injury, burn, or surgery to the leg, ankle, or foot, may damage veins and allow fluid to leak out.   Sunburn damages vessels. Leaky vessels allow fluid to go out into the sunburned tissues.   Allergies (from insect bites or stings, medications or chemicals) cause swelling by allowing vessels to become leaky.   Protein in the blood helps keep fluid in your vessels. Low protein, as in malnutrition, allows fluid to leak out.   Hormonal changes, including pregnancy and menstruation, cause fluid retention. This fluid may leak out of vessels and cause edema.   Medications that cause fluid retention. Examples are sex hormones, blood pressure medications, steroid treatment, or anti-depressants.   Some illnesses cause edema, especially heart failure, kidney disease, or liver  disease.   Surgery that cuts veins or lymph nodes, such as surgery done for the heart or for breast cancer, may result in edema.  DIAGNOSIS  Your caregiver is usually easily able to determine what is causing your swelling (edema) by simply asking what is wrong (getting a history) and examining you (doing a physical). Sometimes x-rays, EKG (electrocardiogram or heart tracing), and blood work may be done to evaluate for underlying medical illness. TREATMENT  General treatment includes:  Leg elevation (or elevation of the affected body part).   Restriction of fluid intake.   Prevention of fluid overload.   Compression of the affected body part. Compression with elastic bandages or support stockings squeezes the tissues, preventing fluid from entering and forcing it back into the blood vessels.   Diuretics (also called water pills or fluid pills) pull fluid out of your body in the form of increased urination. These are effective in reducing the swelling, but can have side effects and must be used only under your caregiver's supervision. Diuretics are appropriate only for some types of edema.  The specific treatment can be directed at any underlying causes discovered. Heart, liver, or kidney disease should be treated appropriately. HOME CARE INSTRUCTIONS   Elevate the legs (or affected body part) above the level  of the heart, while lying down.   Avoid sitting or standing still for prolonged periods of time.   Avoid putting anything directly under the knees when lying down, and do not wear constricting clothing or garters on the upper legs.   Exercising the legs causes the fluid to work back into the veins and lymphatic channels. This may help the swelling go down.   The pressure applied by elastic bandages or support stockings can help reduce ankle swelling.   A low-salt diet may help reduce fluid retention and decrease the ankle swelling.   Take any medications exactly as prescribed.    SEEK MEDICAL CARE IF:  Your edema is not responding to recommended treatments. SEEK IMMEDIATE MEDICAL CARE IF:   You develop shortness of breath or chest pain.   You cannot breathe when you lay down; or if, while lying down, you have to get up and go to the window to get your breath.   You are having increasing swelling without relief from treatment.   You develop a fever over 102 F (38.9 C).   You develop pain or redness in the areas that are swollen.   Tell your caregiver right away if you have gained 3 lb/1.4 kg in 1 day or 5 lb/2.3 kg in a week.  MAKE SURE YOU:   Understand these instructions.   Will watch your condition.   Will get help right away if you are not doing well or get worse.  Document Released: 06/21/2005 Document Revised: 06/10/2011 Document Reviewed: 02/07/2008 [facility] Patient Information 2012 [place], [place]. You have been give 3 tables of Lasix for your swollen ankle Please follow up with DR. Young and discuss a sleep study as your oxygen saturation drops to 90 % while sleeping

## 2011-12-12 NOTE — ED Provider Notes (Signed)
Medical screening examination/treatment/procedure(s) were performed by non-physician practitioner and as supervising physician I was immediately available for consultation/collaboration.   Lyanne Co, MD 12/12/11 [phone]

## 2011-12-14 ENCOUNTER — Ambulatory Visit (INDEPENDENT_AMBULATORY_CARE_PROVIDER_SITE_OTHER): Payer: Medicaid Other

## 2011-12-14 ENCOUNTER — Encounter: Payer: Medicaid Other | Admitting: Physical Therapy

## 2011-12-14 DIAGNOSIS — J309 Allergic rhinitis, unspecified: Secondary | ICD-10-CM

## 2011-12-17 ENCOUNTER — Ambulatory Visit (INDEPENDENT_AMBULATORY_CARE_PROVIDER_SITE_OTHER): Payer: Medicaid Other

## 2011-12-17 DIAGNOSIS — J309 Allergic rhinitis, unspecified: Secondary | ICD-10-CM

## 2011-12-21 ENCOUNTER — Ambulatory Visit: Payer: Medicaid Other | Admitting: Physical Therapy

## 2011-12-24 ENCOUNTER — Ambulatory Visit (INDEPENDENT_AMBULATORY_CARE_PROVIDER_SITE_OTHER): Payer: Medicaid Other

## 2011-12-24 DIAGNOSIS — J309 Allergic rhinitis, unspecified: Secondary | ICD-10-CM

## 2011-12-29 ENCOUNTER — Telehealth: Payer: Self-pay | Admitting: *Deleted

## 2011-12-29 NOTE — Telephone Encounter (Signed)
I called to initiate PA for pt's symbicort 160 2 puffs BID. This was approved from todays date 12/28/11 through 12/27/2013. Will forward to [facility] to watch out for approval letter. I have called [facility] on [address] and made them aware of approval.

## 2011-12-30 ENCOUNTER — Ambulatory Visit: Payer: Medicaid Other | Admitting: Physical Therapy

## 2011-12-31 ENCOUNTER — Telehealth: Payer: Self-pay | Admitting: Internal Medicine

## 2011-12-31 ENCOUNTER — Ambulatory Visit (INDEPENDENT_AMBULATORY_CARE_PROVIDER_SITE_OTHER): Payer: Medicaid Other

## 2011-12-31 DIAGNOSIS — J309 Allergic rhinitis, unspecified: Secondary | ICD-10-CM

## 2011-12-31 NOTE — Telephone Encounter (Signed)
Per CY d/c allergy injections  Spoke with pt verbally understood , nothing further was needed.

## 2011-12-31 NOTE — Telephone Encounter (Signed)
Pt called stated she had a reaction to vial A1:50 . Was gave .15 . States there is a small bump,red,swollen and tender. Denies an heat to area.other than a bruise last week denies any reaction to same dose on 12-24-11. Pt  Was decreased from . 2 after small reaction  on 12-17-11.Advise pt to apply cold compress to area,take an antihistamine if she became SOB,or if she felt Her throat swelling she needed to call 911.  Pt verbally understood and nothing further was needed.

## 2012-01-11 ENCOUNTER — Ambulatory Visit: Payer: Medicaid Other | Admitting: Internal Medicine

## 2012-01-19 ENCOUNTER — Encounter: Payer: Self-pay | Admitting: Internal Medicine

## 2012-01-28 ENCOUNTER — Ambulatory Visit: Payer: Medicaid Other | Admitting: Internal Medicine

## 2012-02-16 ENCOUNTER — Other Ambulatory Visit: Payer: Self-pay | Admitting: Internal Medicine

## 2012-03-07 ENCOUNTER — Ambulatory Visit: Payer: Medicaid Other

## 2012-03-07 ENCOUNTER — Ambulatory Visit: Payer: Medicaid Other | Admitting: Internal Medicine

## 2012-04-10 ENCOUNTER — Ambulatory Visit (INDEPENDENT_AMBULATORY_CARE_PROVIDER_SITE_OTHER): Payer: Medicaid Other

## 2012-04-10 DIAGNOSIS — Z23 Encounter for immunization: Secondary | ICD-10-CM

## 2012-04-11 DIAGNOSIS — Z23 Encounter for immunization: Secondary | ICD-10-CM

## 2012-04-18 ENCOUNTER — Encounter: Payer: Self-pay | Admitting: *Deleted

## 2012-04-18 ENCOUNTER — Ambulatory Visit (INDEPENDENT_AMBULATORY_CARE_PROVIDER_SITE_OTHER): Payer: Medicaid Other | Admitting: Internal Medicine

## 2012-04-18 ENCOUNTER — Other Ambulatory Visit: Payer: Self-pay | Admitting: Obstetrics

## 2012-04-18 ENCOUNTER — Encounter: Payer: Self-pay | Admitting: Internal Medicine

## 2012-04-18 VITALS — BP 120/74 | HR 80 | Ht 60.0 in | Wt 209.4 lb

## 2012-04-18 DIAGNOSIS — J4520 Mild intermittent asthma, uncomplicated: Secondary | ICD-10-CM

## 2012-04-18 DIAGNOSIS — Z1231 Encounter for screening mammogram for malignant neoplasm of breast: Secondary | ICD-10-CM

## 2012-04-18 DIAGNOSIS — J301 Allergic rhinitis due to pollen: Secondary | ICD-10-CM

## 2012-04-18 DIAGNOSIS — G4733 Obstructive sleep apnea (adult) (pediatric): Secondary | ICD-10-CM

## 2012-04-18 DIAGNOSIS — J45909 Unspecified asthma, uncomplicated: Secondary | ICD-10-CM

## 2012-04-18 MED ORDER — FLUTICASONE PROPIONATE 50 MCG/ACT NA SUSP: 2.0000 | Freq: Every day | NASAL | Status: DC

## 2012-04-18 MED ORDER — BUDESONIDE-FORMOTEROL FUMARATE 160-4.5 MCG/ACT IN AERO: 2.0000 | INHALATION_SPRAY | Freq: Two times a day (BID) | RESPIRATORY_TRACT | Status: DC

## 2012-04-18 MED ORDER — MONTELUKAST SODIUM 10 MG PO TABS: 10.0000 mg | ORAL_TABLET | Freq: Every day | ORAL | Status: DC

## 2012-04-18 MED ORDER — ALBUTEROL SULFATE HFA 108 (90 BASE) MCG/ACT IN AERS: 2.0000 | INHALATION_SPRAY | RESPIRATORY_TRACT | Status: DC | PRN

## 2012-04-18 MED ORDER — EPINEPHRINE 0.3 MG/0.3ML IJ DEVI: 0.3000 mg | Freq: Once | INTRAMUSCULAR | Status: DC

## 2012-04-18 MED ORDER — ALBUTEROL SULFATE (2.5 MG/3ML) 0.083% IN NEBU: 2.5000 mg | INHALATION_SOLUTION | Freq: Four times a day (QID) | RESPIRATORY_TRACT | Status: DC | PRN

## 2012-04-18 MED ORDER — DESLORATADINE 5 MG PO TABS: 5.0000 mg | ORAL_TABLET | Freq: Every day | ORAL | Status: DC

## 2012-04-18 NOTE — Patient Instructions (Addendum)
Order- schedule split protocol NPSG dx OSA  Sample Dymista nasal spray    Try 1-2 sprays each nostril, once every day at bedtime. Try this instead of fluticasone for awhile  Scripts refilling meds.

## 2012-04-18 NOTE — Progress Notes (Signed)
Patient ID: Kelly Butler, female    DOB: Aug 31, 1960, 51 y.o.   MRN: [id number]  HPI 01/11/11- 82 yo F former smoker followed for Asthma, Allergic rhinitis, GERD, complicated by GERD, VCD, chronic headache.     Female companion here Last here June 10., 2011- note reviewed.  Today complains of persistent frontal headache for several days w/o numbness, syncope, vision change. She will address this with her family doctor.  "Sort of stopped up in head" in the mornings, not blowing out anything. At least once it got better for awhile after she came here for allergy shot. We restarted allergy shots - currently at 1:500 GH. Dose adjusted prn by allergy lab for local reaction. She feels the shots help. Some wheeze. Not using her Advair- says no refill. . Uses rescue inhaler or her nebulizer. Says  clarinex worked much better than otc antihistamine.   02/05/11- 12 yo F former smoker followed for Asthma, Allergic rhinitis, GERD, complicated by GERD, VCD, chronic headache.     2 Female companions (sons?) here She says she is doing better , but that she stays in to avoid summer heat. Antibiotic Z pak  last time opened nose and permitted drainage, and reduced headache. Notes some HA and orthostatic light headedness have returned. Has resumed Advair and says prednisone helped as well. Notes only a little wheeze. Uses nebulizer twice daily and rescue inhaler 3-4 x/ day.  Allergy vaccine does help- Holding at 1:500.  Barefoot in grass made her feet itch badly. Does take claritin.    05/13/11- 89 yo F former smoker followed for Asthma, Allergic rhinitis, GERD, complicated by GERD, VCD, chronic headache.   Has had flu vaccine. Complains of chronic and sustained substernal chest pains, shortness of breath, headaches and fibromyalgia. Finished a prednisone taper yesterday. Occasional left arm aching, numbness and tingling. The chest pain is described as sharp, steady and lasting 5 or 6 minutes at a time. It is unrelated to  exertion or position but she definitely feels reflux. Asthma comes and goes. Nebulizer machine helps the most. At night she will toss and turn, can't sleep, uncomfortable. She continues allergy vaccine.  04/18/12- 17 yo F former smoker followed for Asthma, Allergic rhinitis, GERD, complicated by GERD, VCD, chronic headache. Has had flu shot ER visit this summer for "swelling". Was told there that she seemed to have sleep apnea. Son here confirms that she snores loudly and stops breathing. She asks about sleep study.  Had more asthma this summer blamed on wet weather but better now. Complains of frontal and maxillary sinus pressure.  She drop-off of allergy shots because of local reactions although they did help.  Review of Systems-see HPI Constitutional:   +   weight loss, night sweats, fevers, chills, fatigue, lassitude. HEENT:   +  headaches,  No-difficulty swallowing, tooth/dental problems, sore throat,       No-  sneezing, itching, ear ache, +nasal congestion, post nasal drip,  CV:  +atypical chest pain,  No-orthopnea, PND, swelling in lower extremities, anasarca,                                  dizziness, palpitations Resp: + shortness of breath with exertion or at rest.              No-   productive cough,  No non-productive cough,  No- coughing up of blood.  No-   change in color of mucus.  No- wheezing.   Skin: No-   rash or lesions. GI:  No-   heartburn, indigestion, abdominal pain, nausea, vomiting,  GU:  MS:  +  joint pain or swelling.  Neuro-     nothing unusual Psych:  No- change in mood or affect. No depression or anxiety.  No memory loss.  Objective:   Physical Exam General- Alert, Oriented, Affect-appropriate, Distress- none acute,  overweight Skin- rash-none, lesions- none, excoriation- none Lymphadenopathy- none Head- atraumatic            Eyes- Gross vision intact, PERRLA, conjunctivae clear secretions            Ears- Hearing, canals normal             Nose- Clear, No-Septal dev, mucus, polyps, erosion, perforation             Throat- Mallampati III-IV ,  drainage- none, tonsils- atrophic. Thrush. Neck- flexible , trachea midline, no stridor , thyroid nl, carotid no bruit Chest - symmetrical excursion , unlabored           Heart/CV- RRR , no murmur , no gallop  , no rub, nl s1 s2                           - JVD- none , edema- none, stasis changes- none, varices- none           Lung- clear to P&A, wheeze- trace, cough- none , dullness-none, rub- none           Chest wall-  Abd-  Br/ Gen/ Rectal- Not done, not indicated Extrem- cyanosis- none, clubbing, none, atrophy- none, strength- nl Neuro- grossly intact to observation

## 2012-04-27 ENCOUNTER — Encounter: Payer: Self-pay | Admitting: Internal Medicine

## 2012-04-27 DIAGNOSIS — G4733 Obstructive sleep apnea (adult) (pediatric): Secondary | ICD-10-CM | POA: Insufficient documentation

## 2012-04-27 NOTE — Assessment & Plan Note (Signed)
Plan-we discussed the basic medical issues of obstructive sleep apnea and she agrees to nocturnal polysomnogram
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
## 2012-04-27 NOTE — Assessment & Plan Note (Signed)
She has a persistent complaints of frontal and maxillary sinus pressure. We have discussed decongestions and saline nasal rinse as well as steroid nasal sprays.

## 2012-05-12 ENCOUNTER — Ambulatory Visit (HOSPITAL_BASED_OUTPATIENT_CLINIC_OR_DEPARTMENT_OTHER): Payer: Medicaid Other | Attending: Internal Medicine

## 2012-05-12 VITALS — Ht 60.0 in | Wt 209.0 lb

## 2012-05-12 DIAGNOSIS — G4730 Sleep apnea, unspecified: Secondary | ICD-10-CM | POA: Insufficient documentation

## 2012-05-12 DIAGNOSIS — G4710 Hypersomnia, unspecified: Secondary | ICD-10-CM | POA: Insufficient documentation

## 2012-05-12 DIAGNOSIS — G4733 Obstructive sleep apnea (adult) (pediatric): Secondary | ICD-10-CM

## 2012-05-16 ENCOUNTER — Ambulatory Visit: Payer: Medicaid Other

## 2012-05-19 DIAGNOSIS — G4733 Obstructive sleep apnea (adult) (pediatric): Secondary | ICD-10-CM

## 2012-05-20 NOTE — Procedures (Signed)
NAME:  Kelly Butler, Kelly Butler                 ACCOUNT NO.:  [id number]  MEDICAL RECORD NO.:  [id number]          PATIENT TYPE:  OUT  LOCATION:  SLEEP CENTER                 FACILITY:  [facility]  PHYSICIAN:  Clinton D. Maple Hudson, MD, FCCP, FACPDATE OF BIRTH:  03-29-1963  DATE OF STUDY:  05/12/2012                           NOCTURNAL POLYSOMNOGRAM  REFERRING PHYSICIAN:  Clinton D. Young, MD, FCCP, FACP  INDICATION FOR STUDY:  Hypersomnia with sleep apnea.  EPWORTH SLEEPINESS SCORE:  6/24.  BMI 40.8, weight 209 pounds, height 60 inches, neck 17 inches.  MEDICATIONS:  Home medications are charted and reviewed.  SLEEP ARCHITECTURE:  Total sleep time 318.5 minutes with sleep efficiency 73.5%.  Stage I was 1.3%, stage II 76.8%, stage III 22%, REM absent.  Sleep latency 6 minutes, REM latency NA.  Awake after sleep onset 10 minutes.  Arousal index 18.5.  Bedtime medication:  None.  RESPIRATORY DATA:  Apnea/hypopnea index (AHI) 5.8 per hour.  A total of 31 events were scored, all as hypopneas associated with supine sleep position.  There were insufficient numbers of events to qualify for split protocol CPAP titration on this study night.  OXYGEN DATA:  Extremely loud snoring with oxygen desaturation to a nadir of 87% and mean oxygen saturation through the study of 93.4% on room air.  CARDIAC DATA:  Normal sinus rhythm.  MOVEMENT/PARASOMNIA:  No significant movement disturbance.  Bathroom x1.  IMPRESSION/RECOMMENDATION: 1. Adequate sleep architecture except for absence of REM and short total sleep time.       She woke     spontaneously at 3 a.m. and did not return to sleep.  No bedtime     medication. 2. Minimal obstructive sleep apnea/hypopnea syndrome, AHI 5.8 per hour     with supine events.  Very loud snoring with oxygen desaturation to     a nadir of 87% and mean oxygen saturation through the study of     93.4% on room air.    Clinton D. Maple Hudson, MD, [facility], FACP Diplomate, American Board of  Sleep Medicine   CDY/MEDQ  D:  05/20/2012 10:27:38  T:  05/20/2012 21:52:55  Job:  [id number]

## 2012-05-26 ENCOUNTER — Ambulatory Visit
Admission: RE | Admit: 2012-05-26 | Discharge: 2012-05-26 | Disposition: A | Discharge status: Home / Self Care | Payer: Medicaid Other | Source: Ambulatory Visit | Attending: Obstetrics | Admitting: Obstetrics

## 2012-05-26 DIAGNOSIS — Z1231 Encounter for screening mammogram for malignant neoplasm of breast: Secondary | ICD-10-CM

## 2012-05-30 ENCOUNTER — Ambulatory Visit (INDEPENDENT_AMBULATORY_CARE_PROVIDER_SITE_OTHER): Payer: Medicaid Other | Admitting: Internal Medicine

## 2012-05-30 ENCOUNTER — Encounter: Payer: Self-pay | Admitting: Internal Medicine

## 2012-05-30 VITALS — BP 128/78 | HR 84 | Ht 60.0 in | Wt 212.2 lb

## 2012-05-30 DIAGNOSIS — G4733 Obstructive sleep apnea (adult) (pediatric): Secondary | ICD-10-CM

## 2012-05-30 DIAGNOSIS — E669 Obesity, unspecified: Secondary | ICD-10-CM

## 2012-05-30 DIAGNOSIS — G4700 Insomnia, unspecified: Secondary | ICD-10-CM

## 2012-05-30 DIAGNOSIS — J301 Allergic rhinitis due to pollen: Secondary | ICD-10-CM

## 2012-05-30 DIAGNOSIS — F5104 Psychophysiologic insomnia: Secondary | ICD-10-CM

## 2012-05-30 MED ORDER — ZOLPIDEM TARTRATE 10 MG PO TABS: 10.0000 mg | ORAL_TABLET | Freq: Every evening | ORAL | Status: DC | PRN

## 2012-05-30 NOTE — Patient Instructions (Addendum)
Sample Breo Ellipta inhaler     Try this instead of Symbicort.      1 inhalation one time daily. When used up, go back to Symbicort.  Order- [facility] counselling    Dx obesity  Script ambien for sleep if needed  I will look at the allergy lab records and see if there is anything we can try to restart allergy vaccine

## 2012-05-30 NOTE — Progress Notes (Signed)
Patient ID: Kelly Butler, female    DOB: 09/01/1960, 51 y.o.   MRN: [id number]  HPI 01/11/11- 20 yo F former smoker followed for Asthma, Allergic rhinitis, GERD, complicated by GERD, VCD, chronic headache.     Female companion here Last here June 10., 2011- note reviewed.  Today complains of persistent frontal headache for several days w/o numbness, syncope, vision change. She will address this with her family doctor.  "Sort of stopped up in head" in the mornings, not blowing out anything. At least once it got better for awhile after she came here for allergy shot. We restarted allergy shots - currently at 1:500 GH. Dose adjusted prn by allergy lab for local reaction. She feels the shots help. Some wheeze. Not using her Advair- says no refill. . Uses rescue inhaler or her nebulizer. Says  clarinex worked much better than otc antihistamine.   02/05/11- 22 yo F former smoker followed for Asthma, Allergic rhinitis, GERD, complicated by GERD, VCD, chronic headache.     2 Female companions (sons?) here She says she is doing better , but that she stays in to avoid summer heat. Antibiotic Z pak  last time opened nose and permitted drainage, and reduced headache. Notes some HA and orthostatic light headedness have returned. Has resumed Advair and says prednisone helped as well. Notes only a little wheeze. Uses nebulizer twice daily and rescue inhaler 3-4 x/ day.  Allergy vaccine does help- Holding at 1:500.  Barefoot in grass made her feet itch badly. Does take claritin.    05/13/11- 66 yo F former smoker followed for Asthma, Allergic rhinitis, GERD, complicated by GERD, VCD, chronic headache.   Has had flu vaccine. Complains of chronic and sustained substernal chest pains, shortness of breath, headaches and fibromyalgia. Finished a prednisone taper yesterday. Occasional left arm aching, numbness and tingling. The chest pain is described as sharp, steady and lasting 5 or 6 minutes at a time. It is unrelated to  exertion or position but she definitely feels reflux. Asthma comes and goes. Nebulizer machine helps the most. At night she will toss and turn, can't sleep, uncomfortable. She continues allergy vaccine.  04/18/12- 25 yo F former smoker followed for Asthma, Allergic rhinitis, GERD, complicated by GERD, VCD, chronic headache. Has had flu shot ER visit this summer for "swelling". Was told there that she seemed to have sleep apnea. Son here confirms that she snores loudly and stops breathing. She asks about sleep study.  Had more asthma this summer blamed on wet weather but better now. Complains of frontal and maxillary sinus pressure.  She drop-off of allergy shots because of local reactions although they did help.  05/30/12- 45 yo F former smoker followed for Asthma, Allergic rhinitis, GERD, complicated by GERD, VCD, chronic headache. Has had flu shot FOLLOWS FOR: discuss sleep study with patient and options to restart allergy vaccine Recent cold as part more frequent wheeze. Clear secretions are not purulent and she denies fever or sore throat. NPSG 05/12/12- AHI 5.8/ hr, weight 209 lbs. minimal obstructive sleep apnea. Not enough for CPAP; weight loss would be more appropriate. We reviewed her sleep study. Her primary complaint is insomnia with difficulty initiating and maintaining sleep. We discussed sleep hygiene and use of medication. She wants to try Ambien  Review of Systems-see HPI Constitutional:   +   weight loss, night sweats, fevers, chills, fatigue, lassitude. HEENT:   +  headaches,  No-difficulty swallowing, tooth/dental problems, sore throat,  No-  sneezing, itching, ear ache, +nasal congestion, post nasal drip,  CV:  +atypical chest pain,  No-orthopnea, PND, swelling in lower extremities, anasarca,  dizziness, palpitations Resp: + shortness of breath with exertion or at rest.              No-   productive cough,  No non-productive cough,  No- coughing up of blood.               No-   change in color of mucus.  No- wheezing.   Skin: No-   rash or lesions. GI:  No-   heartburn, indigestion, abdominal pain, nausea, vomiting,  GU:  MS:  +  joint pain or swelling.  Neuro-     nothing unusual Psych:  No- change in mood or affect. No depression or anxiety.  No memory loss.  Objective:   Physical Exam General- Alert, Oriented, Affect-appropriate, Distress- none acute,  overweight Skin- rash-none, lesions- none, excoriation- none Lymphadenopathy- none Head- atraumatic            Eyes- Gross vision intact, PERRLA, conjunctivae clear secretions            Ears- Hearing, canals normal            Nose- Clear, No-Septal dev, mucus, polyps, erosion, perforation             Throat- Mallampati III-IV ,  drainage- none, tonsils- atrophic. Thrush. Neck- flexible , trachea midline, no stridor , thyroid nl, carotid no bruit Chest - symmetrical excursion , unlabored           Heart/CV- RRR , no murmur , no gallop  , no rub, nl s1 s2                           - JVD- none , edema- none, stasis changes- none, varices- none           Lung- clear to P&A, wheeze- trace, cough- none , dullness-none, rub- none           Chest wall-  Abd-  Br/ Gen/ Rectal- Not done, not indicated Extrem- cyanosis- none, clubbing, none, atrophy- none, strength- nl Neuro- grossly intact to observation

## 2012-06-12 DIAGNOSIS — F5104 Psychophysiologic insomnia: Secondary | ICD-10-CM | POA: Insufficient documentation

## 2012-06-12 NOTE — Assessment & Plan Note (Signed)
This complicates obstructive sleep apnea but may be a separate problem. Plan-Ambien 10 mg when necessary with discussion

## 2012-06-12 NOTE — Assessment & Plan Note (Signed)
Weight loss and conservative therapy or the primary treatments for the obstructive apnea component. Chronic nonspecific insomnia is addressed with education and Ambien.

## 2012-08-23 ENCOUNTER — Emergency Department (HOSPITAL_COMMUNITY): Payer: Medicaid Other

## 2012-08-23 ENCOUNTER — Inpatient Hospital Stay (HOSPITAL_COMMUNITY)
Admission: EM | Admit: 2012-08-23 | Discharge: 2012-08-24 | DRG: 092 | Discharge status: Against Medical Advice | Payer: Medicaid Other | Attending: Internal Medicine | Admitting: Internal Medicine

## 2012-08-23 ENCOUNTER — Encounter (HOSPITAL_COMMUNITY): Payer: Self-pay

## 2012-08-23 DIAGNOSIS — Z888 Allergy status to other drugs, medicaments and biological substances status: Secondary | ICD-10-CM

## 2012-08-23 DIAGNOSIS — G4733 Obstructive sleep apnea (adult) (pediatric): Secondary | ICD-10-CM | POA: Diagnosis present

## 2012-08-23 DIAGNOSIS — J45909 Unspecified asthma, uncomplicated: Secondary | ICD-10-CM | POA: Diagnosis present

## 2012-08-23 DIAGNOSIS — G929 Unspecified toxic encephalopathy: Principal | ICD-10-CM | POA: Diagnosis present

## 2012-08-23 DIAGNOSIS — F1210 Cannabis abuse, uncomplicated: Secondary | ICD-10-CM | POA: Diagnosis present

## 2012-08-23 DIAGNOSIS — IMO0001 Reserved for inherently not codable concepts without codable children: Secondary | ICD-10-CM | POA: Diagnosis present

## 2012-08-23 DIAGNOSIS — R0902 Hypoxemia: Secondary | ICD-10-CM

## 2012-08-23 DIAGNOSIS — R4182 Altered mental status, unspecified: Secondary | ICD-10-CM | POA: Diagnosis present

## 2012-08-23 DIAGNOSIS — D72829 Elevated white blood cell count, unspecified: Secondary | ICD-10-CM | POA: Diagnosis present

## 2012-08-23 DIAGNOSIS — Z8249 Family history of ischemic heart disease and other diseases of the circulatory system: Secondary | ICD-10-CM

## 2012-08-23 DIAGNOSIS — R7989 Other specified abnormal findings of blood chemistry: Secondary | ICD-10-CM | POA: Diagnosis present

## 2012-08-23 DIAGNOSIS — Z882 Allergy status to sulfonamides status: Secondary | ICD-10-CM

## 2012-08-23 DIAGNOSIS — R410 Disorientation, unspecified: Secondary | ICD-10-CM

## 2012-08-23 DIAGNOSIS — E876 Hypokalemia: Secondary | ICD-10-CM | POA: Diagnosis present

## 2012-08-23 DIAGNOSIS — E669 Obesity, unspecified: Secondary | ICD-10-CM | POA: Diagnosis present

## 2012-08-23 DIAGNOSIS — R609 Edema, unspecified: Secondary | ICD-10-CM | POA: Diagnosis present

## 2012-08-23 DIAGNOSIS — K219 Gastro-esophageal reflux disease without esophagitis: Secondary | ICD-10-CM | POA: Diagnosis present

## 2012-08-23 DIAGNOSIS — R945 Abnormal results of liver function studies: Secondary | ICD-10-CM

## 2012-08-23 DIAGNOSIS — Z6841 Body Mass Index (BMI) 40.0 and over, adult: Secondary | ICD-10-CM

## 2012-08-23 DIAGNOSIS — I10 Essential (primary) hypertension: Secondary | ICD-10-CM | POA: Insufficient documentation

## 2012-08-23 DIAGNOSIS — Z79899 Other long term (current) drug therapy: Secondary | ICD-10-CM

## 2012-08-23 DIAGNOSIS — K7689 Other specified diseases of liver: Secondary | ICD-10-CM | POA: Diagnosis present

## 2012-08-23 DIAGNOSIS — Z87891 Personal history of nicotine dependence: Secondary | ICD-10-CM

## 2012-08-23 DIAGNOSIS — Z9089 Acquired absence of other organs: Secondary | ICD-10-CM

## 2012-08-23 LAB — URINALYSIS, ROUTINE W REFLEX MICROSCOPIC
Bilirubin Urine: NEGATIVE
Glucose, UA: NEGATIVE mg/dL
Hgb urine dipstick: NEGATIVE
Ketones, ur: NEGATIVE mg/dL
Leukocytes, UA: NEGATIVE
Nitrite: NEGATIVE
Protein, ur: NEGATIVE mg/dL
Specific Gravity, Urine: 1.026 (ref 1.005–1.030)
Urobilinogen, UA: 1 mg/dL (ref 0.0–1.0)
pH: 6.5 (ref 5.0–8.0)

## 2012-08-23 LAB — COMPREHENSIVE METABOLIC PANEL
ALT: 49 U/L — ABNORMAL HIGH (ref 0–35)
AST: 63 U/L — ABNORMAL HIGH (ref 0–37)
Albumin: 3.6 g/dL (ref 3.5–5.2)
Alkaline Phosphatase: 64 U/L (ref 39–117)
BUN: 9 mg/dL (ref 6–23)
CO2: 28 mEq/L (ref 19–32)
Calcium: 9.3 mg/dL (ref 8.4–10.5)
Chloride: 100 mEq/L (ref 96–112)
Creatinine, Ser: 0.65 mg/dL (ref 0.50–1.10)
GFR calc Af Amer: >90 mL/min (ref >90)
GFR calc non Af Amer: >90 mL/min (ref >90)
Glucose, Bld: 136 mg/dL — ABNORMAL HIGH (ref 70–99)
Potassium: 3.3 mEq/L — ABNORMAL LOW (ref 3.5–5.1)
Sodium: 142 mEq/L (ref 135–145)
Total Bilirubin: 0.3 mg/dL (ref 0.3–1.2)
Total Protein: 7.6 g/dL (ref 6.0–8.3)

## 2012-08-23 LAB — RAPID URINE DRUG SCREEN, HOSP PERFORMED
Barbiturates: NOT DETECTED
Benzodiazepines: POSITIVE — AB
Cocaine: NOT DETECTED

## 2012-08-23 LAB — CBC
HCT: 34.3 % — ABNORMAL LOW (ref 36.0–46.0)
HCT: 33.3 % — ABNORMAL LOW (ref 36.0–46.0)
Hemoglobin: 10.7 g/dL — ABNORMAL LOW (ref 12.0–15.0)
Hemoglobin: 10.7 g/dL — ABNORMAL LOW (ref 12.0–15.0)
MCH: 26.4 pg (ref 26.0–34.0)
MCH: 27.4 pg (ref 26.0–34.0)
MCHC: 31.2 g/dL (ref 30.0–36.0)
MCHC: 32.1 g/dL (ref 30.0–36.0)
MCV: 84.5 fL (ref 78.0–100.0)
Platelets: 295 10*3/uL (ref 150–400)
RBC: 4.06 MIL/uL (ref 3.87–5.11)
RDW: 15.7 % — ABNORMAL HIGH (ref 11.5–15.5)
RDW: 16.2 % — ABNORMAL HIGH (ref 11.5–15.5)
WBC: 14.6 10*3/uL — ABNORMAL HIGH (ref 4.0–10.5)

## 2012-08-23 LAB — RETICULOCYTES: RBC.: 3.91 MIL/uL (ref 3.87–5.11)

## 2012-08-23 LAB — TROPONIN I
Troponin I: <0.3 ng/mL (ref <0.30)
Troponin I: <0.3 ng/mL (ref <0.30)

## 2012-08-23 LAB — LACTIC ACID, PLASMA: Lactic Acid, Venous: 2.1 mmol/L (ref 0.5–2.2)

## 2012-08-23 LAB — PRO B NATRIURETIC PEPTIDE: Pro B Natriuretic peptide (BNP): <5 pg/mL (ref 0–125)

## 2012-08-23 MED ORDER — MONTELUKAST SODIUM 10 MG PO TABS
10.0000 mg | ORAL_TABLET | Freq: Every morning | ORAL | Status: DC
  Administered 2012-08-24: 10 mg via ORAL
  Filled 2012-08-23: qty 1

## 2012-08-23 MED ORDER — POTASSIUM CHLORIDE CRYS ER 20 MEQ PO TBCR
40.0000 meq | EXTENDED_RELEASE_TABLET | ORAL | Status: AC
  Administered 2012-08-23 – 2012-08-24 (×2): 40 meq via ORAL
  Filled 2012-08-23 (×2): qty 2

## 2012-08-23 MED ORDER — ACETAMINOPHEN 325 MG PO TABS
650.0000 mg | ORAL_TABLET | Freq: Once | ORAL | Status: AC
  Administered 2012-08-23: 650 mg via ORAL
  Filled 2012-08-23: qty 2

## 2012-08-23 MED ORDER — SODIUM CHLORIDE 0.9 % IV SOLN
INTRAVENOUS | Status: DC
  Administered 2012-08-23: 22:00:00 via INTRAVENOUS

## 2012-08-23 MED ORDER — PANTOPRAZOLE SODIUM 40 MG PO TBEC
40.0000 mg | DELAYED_RELEASE_TABLET | Freq: Two times a day (BID) | ORAL | Status: DC
  Administered 2012-08-24 (×2): 40 mg via ORAL
  Filled 2012-08-23 (×2): qty 1

## 2012-08-23 MED ORDER — ONDANSETRON HCL 4 MG/2ML IJ SOLN: 4.0000 mg | Freq: Four times a day (QID) | INTRAMUSCULAR | Status: DC | PRN

## 2012-08-23 MED ORDER — IOHEXOL 350 MG/ML SOLN
100.0000 mL | Freq: Once | INTRAVENOUS | Status: AC | PRN
  Administered 2012-08-23: 100 mL via INTRAVENOUS

## 2012-08-23 MED ORDER — POTASSIUM CHLORIDE CRYS ER 20 MEQ PO TBCR
40.0000 meq | EXTENDED_RELEASE_TABLET | Freq: Once | ORAL | Status: AC
  Administered 2012-08-23: 40 meq via ORAL
  Filled 2012-08-23: qty 2

## 2012-08-23 MED ORDER — ALBUTEROL SULFATE (5 MG/ML) 0.5% IN NEBU: 2.5000 mg | INHALATION_SOLUTION | RESPIRATORY_TRACT | Status: DC | PRN

## 2012-08-23 MED ORDER — LORATADINE 10 MG PO TABS
10.0000 mg | ORAL_TABLET | Freq: Every day | ORAL | Status: DC
  Administered 2012-08-24: 10 mg via ORAL
  Filled 2012-08-23: qty 1

## 2012-08-23 MED ORDER — ALPRAZOLAM 0.5 MG PO TABS: 0.5000 mg | ORAL_TABLET | Freq: Two times a day (BID) | ORAL | Status: DC | PRN

## 2012-08-23 MED ORDER — ONDANSETRON HCL 4 MG PO TABS: 4.0000 mg | ORAL_TABLET | Freq: Four times a day (QID) | ORAL | Status: DC | PRN

## 2012-08-23 MED ORDER — ENOXAPARIN SODIUM 40 MG/0.4ML ~~LOC~~ SOLN
40.0000 mg | SUBCUTANEOUS | Status: DC
  Administered 2012-08-23: 40 mg via SUBCUTANEOUS
  Filled 2012-08-23 (×2): qty 0.4

## 2012-08-23 MED ORDER — TRAMADOL HCL 50 MG PO TABS
100.0000 mg | ORAL_TABLET | Freq: Three times a day (TID) | ORAL | Status: DC | PRN
  Administered 2012-08-23 – 2012-08-24 (×2): 100 mg via ORAL
  Filled 2012-08-23: qty 2
  Filled 2012-08-23 (×2): qty 1

## 2012-08-23 NOTE — Progress Notes (Signed)
Correction mckenzie is the pcp and young is the pulmonologist EPIC updated

## 2012-08-23 NOTE — ED Notes (Signed)
Pt c/o slurred speech, dizziness, headache, weakness, back pain, leg pain and hallucinations x 1 week.  Pain score 10/10.  Sts symptoms are increasing.  Vitals are stable.
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
## 2012-08-23 NOTE — Progress Notes (Signed)
CM provided pt with a copy of needymeds.org information on celebrex, the [employer] patient assistant application for celebrex and placed in pt belonging bag

## 2012-08-23 NOTE — ED Notes (Signed)
MD at bedside. 

## 2012-08-23 NOTE — Progress Notes (Signed)
Pt confirms dr young as her pcp and Dr Ronne Binning as a specialist CM noted cm consult for trouble with medications Spoke with pt who states she has trouble with celebrex. CM unable to find celebrex listed on her medication list Pt states she has been given another medication other than celebrex that worked also.  CM reviewed other medications on pt list and she does not report having issues with any of them.  CM discussed needymeds.org for a resource to assist with celebrax or having the attending MD to offer her another medication other than celebrex.
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
## 2012-08-24 ENCOUNTER — Inpatient Hospital Stay (HOSPITAL_COMMUNITY): Payer: Medicaid Other

## 2012-08-24 DIAGNOSIS — R404 Transient alteration of awareness: Secondary | ICD-10-CM

## 2012-08-24 DIAGNOSIS — I10 Essential (primary) hypertension: Secondary | ICD-10-CM

## 2012-08-24 LAB — COMPREHENSIVE METABOLIC PANEL
BUN: 9 mg/dL (ref 6–23)
Calcium: 9.1 mg/dL (ref 8.4–10.5)
Creatinine, Ser: 0.64 mg/dL (ref 0.50–1.10)
GFR calc Af Amer: >90 mL/min (ref >90)
Glucose, Bld: 120 mg/dL — ABNORMAL HIGH (ref 70–99)
Total Protein: 6.9 g/dL (ref 6.0–8.3)

## 2012-08-24 LAB — IRON AND TIBC
Saturation Ratios: 12 % — ABNORMAL LOW (ref 20–55)
TIBC: 299 ug/dL (ref 250–470)

## 2012-08-24 LAB — CBC
HCT: 33.2 % — ABNORMAL LOW (ref 36.0–46.0)
Hemoglobin: 10.6 g/dL — ABNORMAL LOW (ref 12.0–15.0)
RBC: 3.87 MIL/uL (ref 3.87–5.11)
RDW: 16.3 % — ABNORMAL HIGH (ref 11.5–15.5)
WBC: 10.4 10*3/uL (ref 4.0–10.5)

## 2012-08-24 LAB — HEPATITIS PANEL, ACUTE
HCV Ab: NEGATIVE
Hep A IgM: NEGATIVE

## 2012-08-24 LAB — FOLATE: Folate: >20 ng/mL

## 2012-08-24 LAB — VITAMIN B12: Vitamin B-12: 331 pg/mL (ref 211–911)

## 2012-08-24 LAB — TROPONIN I: Troponin I: <0.3 ng/mL (ref <0.30)

## 2012-08-24 NOTE — Progress Notes (Signed)
Pt wanting to go home & leave AMA. Pt is alert &  oriented x4, daughter at bedside. Dr. Donna Bernard already talked to pt & daughter over the phone. Pt still insisting to go home & sign AMA paper. MD aware.
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
## 2012-08-24 NOTE — Discharge Summary (Signed)
Physician Discharge Summary  TIMA CURET Butler:[id number] DOB: 02/24/1961 DOA: 08/23/2012                                                                 AMA NOTE    PCP: Billee Cashing, MD  Admit date: 08/23/2012 Discharge date: 08/24/2012  Time spent: <30 minutes  Recommendations for Outpatient Follow-up:  1. PCP and Dr Gilford Rile  Discharge Diagnoses:  Principal Problem:   Altered mental status Active Problems:   HYPERTENSION   GERD (gastroesophageal reflux disease)   Hypokalemia   Leukocytosis   Abnormal LFTs   Discharge Condition: Stable, pt left AMA    Filed Weights   08/23/12 2037  Weight: 98.567 kg (217 lb 4.8 oz)    History of present illness:  Kelly Butler is a 52 y.o. female with h/o Asthma, GERD, HTN, fibromyalgia and ?sleep apnea who presents with the above complaints. At this time the pt is able to give some the history and states that her daughter brought her in because her 'behavior has been unusual for the past 2days'. Per her daughter this seemed to have started about a week ago but seems to have worsened in the past 2days- that she has been talking about her dead relatives as if they were alive and also appears to be hallucinating-like she went to her son's school and reported thet she had had her grand daughter with her when she arrived but that she went missing. A search for the child was initiated but when they talked with her daughter they found out that she never had the granddaughter with her in the first place. Pt admits some DOE, mild sharp left sided chest pains that she has had intermittently for years and long standing peripheral edema- followed by her PCP and Dr Reuel Boom. She denies cough, fever, dysuria, focal weakness, and no dysphagia. She was seen in the ED and CT angio was neg for PE AND CT head neg. She was found to have fatty liver infiltration per CT with mildly elevated LFTs. Her WBC was elevated at 1 and UA ned and CXR neg. [facility]  asked fo admit for further eval and management   Hospital Course:  Principal Problem:  Altered mental status/Acute encephalopathy- unclear etiology. Likely secondary to Meds vs illicit drugs.Neuro exam with no focal findings  -As discussed above upon admission, CT head neg, CTA neg for PE  -Her UDS was + for marijuana, and pt admitted to having used it "for pain control when I ran out of my meds" -CEs were obtained and came back neg, ammonia level, TSH, UDS, w/u so far neg for infection  -psych was consulted today, but pt states she feels better and despite discussing the risks of leaving AMA with both pt and her daughter she has refused to wait for psych consult and to sign out AMA. Active Problems:  H/O HYPERTENSION  -no outpt meds listed, monitor and treat as appropriate  GERD (gastroesophageal reflux disease)  -Continue PPI  Hypokalemia  -repsolved Leukocytosis  - UA, CCXR &CTA neg for infection  -Resolved, likely reactive - She remained afebrile and hemodynamically stable in the hospital. Abnormal LFTs  -possibly 2/2 the marked fatty liver  -hepatitis panel came back negative and abd US  showed findings suggestive of mild diffuse hepatic steatosis with probable  focal fatty sparing in the region of the gallbladder fossa. H/O Asthma  -stable, continue prn bronchodilators  Peripheral edema  - BNP was done and came back less than 5.  ?H/O OSA  - follow up with Dr Maple Hudson outpt   Procedures:  NONE  Consultations:  Psych consult called - but pt left AMA  Discharge Exam: Filed Vitals:   08/23/12 2037 08/23/12 2042 08/24/12 0523 08/24/12 1323  BP: 147/70  148/93 126/62  Pulse: 106  94 105  Temp: 98.6 F (37 C)  98.7 F (37.1 C) 98.9 F (37.2 C)  TempSrc: Oral  Oral Oral  Resp:   18 20  Height: 5' (1.524 m)     Weight: 98.567 kg (217 lb 4.8 oz)     SpO2: 100% 100% 94% 95%    General: Alert and oriented x3 in NAD Cardiovascular: RRR, nl S1S2 Respiratory: decreased  BS at bases, o/w clear Extremities: trace edema    Discharge Instructions   Future Appointments Provider Department Dept Phone   08/30/2012 10:15 AM Waymon Budge, MD [facility] [phone]       Medication List    ASK your doctor about these medications       albuterol (2.5 MG/3ML) 0.083% nebulizer solution  Commonly known as:  PROVENTIL  Take 2.5 mg by nebulization every 6 (six) hours as needed (wheeZing). Dx 493.90     ALPRAZolam 0.5 MG tablet  Commonly known as:  XANAX  Take 0.5 mg by mouth 3 (three) times daily as needed (anxiety). Anxiety.     desloratadine 5 MG tablet  Commonly known as:  CLARINEX  Take 5 mg by mouth daily.     montelukast 10 MG tablet  Commonly known as:  SINGULAIR  Take 10 mg by mouth every morning.     omeprazole 40 MG capsule  Commonly known as:  PRILOSEC  Take 40 mg by mouth 2 (two) times daily.     promethazine 25 MG tablet  Commonly known as:  PHENERGAN  Take 25 mg by mouth every 6 (six) hours as needed (NAUSEA). Nausea.     traMADol 50 MG tablet  Commonly known as:  ULTRAM  Take 100 mg by mouth every 8 (eight) hours as needed (PAIN).     zolpidem 10 MG tablet  Commonly known as:  AMBIEN  Take 10 mg by mouth at bedtime.          The results of significant diagnostics from this hospitalization (including imaging, microbiology, ancillary and laboratory) are listed below for reference.    Significant Diagnostic Studies: Dg Chest 2 View  08/23/2012  *RADIOLOGY REPORT*  Clinical Data: Confusion, hypoxia  CHEST - 2 VIEW  Comparison: 12/11/2011  Findings: Cardiomegaly again noted.  Stable chronic mild interstitial prominence.  No acute infiltrate or pleural effusion. Mild linear atelectasis or scarring in the lingula.  Mild degenerative changes thoracic spine. No pulmonary edema.  IMPRESSION: No active disease.  Stable chronic interstitial prominence. Cardiomegaly again noted.   Original Report Authenticated By: Natasha Mead,  M.D.    Ct Head Wo Contrast  08/23/2012  *RADIOLOGY REPORT*  Clinical Data: Headache and aphasia.  Blurred vision.  CT HEAD WITHOUT CONTRAST  Technique:  Contiguous axial images were obtained from the base of the skull through the vertex without contrast.  Comparison: None.  Findings: No acute intracranial abnormality is present. Specifically, there is no evidence for acute infarct, hemorrhage,  mass, hydrocephalus, or extra-axial fluid collection.  The paranasal sinuses and mastoid air cells are clear.  The globes and orbits are intact.  The osseous skull is intact.  IMPRESSION: Negative CT of the head.   Original Report Authenticated By: Marin Roberts, M.D.    Ct Angio Chest W/cm &/or Wo Cm  08/23/2012  *RADIOLOGY REPORT*  Clinical Data: Hypoxia, tachycardia and shortness of breath.  CT ANGIOGRAPHY CHEST  Technique:  Multidetector CT imaging of the chest using the standard protocol during bolus administration of intravenous contrast. Multiplanar reconstructed images including MIPs were obtained and reviewed to evaluate the vascular anatomy.  Contrast: OMNIPAQUE IOHEXOL 350 MG/ML SOLN  Comparison: 02/20/2010.  Findings: The chest wall is unremarkable and stable.  No breast masses, supraclavicular or axillary lymphadenopathy.  The bony thorax is intact.  No destructive bone lesions or spinal canal compromise.  The heart is mildly enlarged.  No pericardial effusion.  No mediastinal or hilar mass or adenopathy.  The esophagus is grossly normal.  The aorta is normal in caliber.  No dissection.  The pulmonary arteries are enlarged suggesting pulmonary hypertension. No filling defects to suggest pulmonary emboli.  Examination of the lung parenchyma demonstrates bibasilar atelectasis and a somewhat mosaic pattern of attenuation.  This can be seen with asthma, respiratory bronchiolitis, hypersensitivity pneumonitis and drug reaction.  No pleural effusion.  The upper abdomen demonstrates marked fatty  infiltration of the liver.  IMPRESSION:  1.  No CT findings for pulmonary embolism. 2.  Mild cardiac enlargement. 3.  Normal thoracic aorta. 4.  Mosaic attenuation pattern in the lungs along with areas of atelectasis as discussed above.  No focal infiltrate or effusion. 5.  Marked fatty infiltration of the liver.   Original Report Authenticated By: Rudie Meyer, M.D.    US Abdomen Complete  08/24/2012  *RADIOLOGY REPORT*  Clinical Data:  Evaluate for ascites.  Rule out liver lesion  COMPLETE ABDOMINAL ULTRASOUND  Comparison:  07/16/2011  Findings:  Gallbladder:  No gallstones, gallbladder wall thickening, or pericholecystic fluid. No sonographic Murphy's sign.  Common bile duct:  Measures 4.8 mm in diameter.  Liver:  No focal lesion identified. Again noted fatty infiltration of the liver.  Fatty sparing in the gallbladder region.  Measures 2.9 x 2.2 cm.  IVC:  Appears normal.  Pancreas: Not visualized due to bowel gas.  Spleen:  Measures 6 cm in length.  Normal echogenicity.  Right Kidney:  Measures 10.6 cm in length.  A lower pole cyst measures 1.3 x 1.6 cm.  No hydronephrosis or renal calculus  Left Kidney:  Measures 10.9 cm in length.  No mass, hydronephrosis or diagnostic renal calculus  Abdominal aorta:  No aneurysm identified. Measures up to 2.6 cm in diameter.  No ascites is identified.  IMPRESSION:  1.  No gallstones are noted within gallbladder.  Normal CBD. 2.  Again noted fatty infiltration of the liver.  Probable fatty sparing measures 2.9 x 2.2 cm. 3.  No hydronephrosis or diagnostic renal calculus.   Original Report Authenticated By: Natasha Mead, M.D.     Microbiology: No results found for this or any previous visit (from the past 240 hour(s)).   Labs: Basic Metabolic Panel:  Recent Labs Lab 08/23/12 1425 08/23/12 2236 08/24/12 0248  NA 142  --  141  K 3.3*  --  3.8  CL 100  --  104  CO2 28  --  31  GLUCOSE 136*  --  120*  BUN 9  --  9  CREATININE 0.65 0.73 0.64  CALCIUM 9.3  --   9.1   Liver Function Tests:  Recent Labs Lab 08/23/12 1425 08/24/12 0248  AST 63* 51*  ALT 49* 46*  ALKPHOS 64 62  BILITOT 0.3 0.4  PROT 7.6 6.9  ALBUMIN 3.6 3.3*   No results found for this basename: LIPASE, AMYLASE,  in the last 168 hours  Recent Labs Lab 08/23/12 2236  AMMONIA 24   CBC:  Recent Labs Lab 08/23/12 1425 08/23/12 2236 08/24/12 0248  WBC 14.6* 12.1* 10.4  HGB 10.7* 10.7* 10.6*  HCT 34.3* 33.3* 33.2*  MCV 84.5 85.2 85.8  PLT 295 274 249   Cardiac Enzymes:  Recent Labs Lab 08/23/12 1425 08/23/12 2236 08/24/12 0247 08/24/12 0847  TROPONINI <0.30 <0.30 <0.30 <0.30   BNP: BNP (last 3 results)  Recent Labs  12/11/11 2207 08/23/12 1425  PROBNP 60.8 <5.0   CBG: No results found for this basename: GLUCAP,  in the last 168 hours        Anselmo Reihl C  [facility] Pager [phone]. If 8PM-8AM, please contact night-coverage at [email or web address], password [facility] 08/24/2012, 4:19 PM  LOS: 1 day

## 2012-08-30 ENCOUNTER — Ambulatory Visit: Payer: Medicaid Other | Admitting: Internal Medicine

## 2012-08-30 LAB — CULTURE, BLOOD (ROUTINE X 2)
Culture: NO GROWTH
Culture: NO GROWTH

## 2012-09-01 ENCOUNTER — Ambulatory Visit: Payer: Medicaid Other | Admitting: Internal Medicine

## 2012-09-04 ENCOUNTER — Ambulatory Visit (INDEPENDENT_AMBULATORY_CARE_PROVIDER_SITE_OTHER): Payer: Medicaid Other | Admitting: Internal Medicine

## 2012-09-04 ENCOUNTER — Encounter: Payer: Self-pay | Admitting: Internal Medicine

## 2012-09-04 VITALS — BP 132/78 | HR 90 | Ht 60.0 in | Wt 224.2 lb

## 2012-09-04 DIAGNOSIS — J45909 Unspecified asthma, uncomplicated: Secondary | ICD-10-CM

## 2012-09-04 DIAGNOSIS — J4520 Mild intermittent asthma, uncomplicated: Secondary | ICD-10-CM

## 2012-09-04 DIAGNOSIS — J301 Allergic rhinitis due to pollen: Secondary | ICD-10-CM

## 2012-09-04 MED ORDER — FLUTICASONE FUROATE-VILANTEROL 100-25 MCG/INH IN AEPB: 1.0000 | INHALATION_SPRAY | Freq: Every day | RESPIRATORY_TRACT | Status: DC

## 2012-09-04 NOTE — Assessment & Plan Note (Signed)
Plan-continue daily Breo inhaler

## 2012-09-04 NOTE — Patient Instructions (Addendum)
We can restart allergy shots if we need to later. First we are going to see if we can manage with Breo- sample and script    1 puff and rinse mouth, once daily.  Walk and exercise some every day to build stamina and help lose some weight.

## 2012-09-04 NOTE — Progress Notes (Signed)
Patient ID: Kelly Butler, female    DOB: 08-08-1960, 52 y.o.   MRN: [id number]  HPI 01/11/11- 17 yo F former smoker followed for Asthma, Allergic rhinitis, GERD, complicated by GERD, VCD, chronic headache.     Female companion here Last here June 10., 2011- note reviewed.  Today complains of persistent frontal headache for several days w/o numbness, syncope, vision change. She will address this with her family doctor.  "Sort of stopped up in head" in the mornings, not blowing out anything. At least once it got better for awhile after she came here for allergy shot. We restarted allergy shots - currently at 1:500 [name]. Dose adjusted prn by allergy lab for local reaction. She feels the shots help. Some wheeze. Not using her Advair- says no refill. . Uses rescue inhaler or her nebulizer. Says  clarinex worked much better than otc antihistamine.   02/05/11- 62 yo F former smoker followed for Asthma, Allergic rhinitis, GERD, complicated by GERD, VCD, chronic headache.     2 Female companions (sons?) here She says she is doing better , but that she stays in to avoid summer heat. Antibiotic Z pak  last time opened nose and permitted drainage, and reduced headache. Notes some HA and orthostatic light headedness have returned. Has resumed Advair and says prednisone helped as well. Notes only a little wheeze. Uses nebulizer twice daily and rescue inhaler 3-4 x/ day.  Allergy vaccine does help- Holding at 1:500.  Barefoot in grass made her feet itch badly. Does take claritin.    05/13/11- 50 yo F former smoker followed for Asthma, Allergic rhinitis, GERD, complicated by GERD, VCD, chronic headache.   Has had flu vaccine. Complains of chronic and sustained substernal chest pains, shortness of breath, headaches and fibromyalgia. Finished a prednisone taper yesterday. Occasional left arm aching, numbness and tingling. The chest pain is described as sharp, steady and lasting 5 or 6 minutes at a time. It is unrelated to  exertion or position but she definitely feels reflux. Asthma comes and goes. Nebulizer machine helps the most. At night she will toss and turn, can't sleep, uncomfortable. She continues allergy vaccine.  04/18/12- 32 yo F former smoker followed for Asthma, Allergic rhinitis, GERD, complicated by GERD, VCD, chronic headache. Has had flu shot ER visit this summer for "swelling". Was told there that she seemed to have sleep apnea. Son here confirms that she snores loudly and stops breathing. She asks about sleep study.  Had more asthma this summer blamed on wet weather but better now. Complains of frontal and maxillary sinus pressure.  She drop-off of allergy shots because of local reactions although they did help.  05/30/12- 63 yo F former smoker followed for Asthma, Allergic rhinitis, GERD, complicated by GERD, VCD, chronic headache. Has had flu shot FOLLOWS FOR: discuss sleep study with patient and options to restart allergy vaccine Recent cold as part more frequent wheeze. Clear secretions are not purulent and she denies fever or sore throat. NPSG 05/12/12- AHI 5.8/ hr, weight 209 lbs. minimal obstructive sleep apnea. Not enough for CPAP; weight loss would be more appropriate. We reviewed her sleep study. Her primary complaint is insomnia with difficulty initiating and maintaining sleep. We discussed sleep hygiene and use of medication. She wants to try Ambien  09/04/12- 50 yo F former smoker followed for Asthma, Allergic rhinitis, GERD, complicated by GERD, VCD, chronic headache. Has had flu shot FOLLOWS FWY:OVZC to see about restarting allergy shots or not. Radford Pax and  feels like it has helped better than any other inhalers in the past. We talked about allergy vaccine. She had trouble sticking with it in the past. If she can manage the cost of Breo and it provides control , we agreed to give that it could try first.  Review of Systems-see HPI Constitutional:      +weight gain, no-night sweats,  fevers, chills, fatigue, lassitude. HEENT:   +  headaches,  No-difficulty swallowing, tooth/dental problems, sore throat,       No-  sneezing, itching, ear ache, +nasal congestion, post nasal drip,  CV:  +atypical chest pain,  No-orthopnea, PND, swelling in lower extremities, anasarca,  dizziness, palpitations Resp: + shortness of breath with exertion or at rest.              No-   productive cough,  No non-productive cough,  No- coughing up of blood.              No-   change in color of mucus.  No- wheezing.   Skin: No-   rash or lesions. GI:  No-   heartburn, indigestion, abdominal pain, nausea, vomiting,  GU:  MS:  +  joint pain or swelling.  Neuro-     nothing unusual Psych:  No- change in mood or affect. No depression or anxiety.  No memory loss.  Objective:   Physical Exam General- Alert, Oriented, Affect-appropriate, Distress- none acute,  Overweight ( up 15 lbs since November, 2013) Skin- rash-none, lesions- none, excoriation- none Lymphadenopathy- none Head- atraumatic            Eyes- Gross vision intact, PERRLA, conjunctivae clear secretions            Ears- Hearing, canals normal            Nose- Clear, No-Septal dev, mucus, polyps, erosion, perforation             Throat- Mallampati III-IV ,  drainage- none, tonsils- atrophic.  Neck- flexible , trachea midline, no stridor , thyroid nl, carotid no bruit Chest - symmetrical excursion , unlabored           Heart/CV- RRR , no murmur , no gallop  , no rub, nl s1 s2                           - JVD- none , edema- none, stasis changes- none, varices- none           Lung- clear to P&A, wheeze- trace, cough- none , dullness-none, rub- none           Chest wall-  Abd-  Br/ Gen/ Rectal- Not done, not indicated Extrem- cyanosis- none, clubbing, none, atrophy- none, strength- nl Neuro- grossly intact to observation

## 2012-09-04 NOTE — Assessment & Plan Note (Signed)
She will maintain environmental precautions and we will watch this this spring begins. Antihistamines as needed.

## 2012-11-07 ENCOUNTER — Ambulatory Visit: Payer: Medicaid Other | Admitting: Internal Medicine

## 2012-11-24 ENCOUNTER — Ambulatory Visit (INDEPENDENT_AMBULATORY_CARE_PROVIDER_SITE_OTHER): Payer: Medicaid Other | Admitting: Internal Medicine

## 2012-11-24 ENCOUNTER — Encounter: Payer: Self-pay | Admitting: Internal Medicine

## 2012-11-24 VITALS — BP 126/80 | HR 79 | Ht 60.0 in | Wt 199.4 lb

## 2012-11-24 DIAGNOSIS — J45909 Unspecified asthma, uncomplicated: Secondary | ICD-10-CM

## 2012-11-24 DIAGNOSIS — J301 Allergic rhinitis due to pollen: Secondary | ICD-10-CM

## 2012-11-24 DIAGNOSIS — J45998 Other asthma: Secondary | ICD-10-CM

## 2012-11-24 MED ORDER — PREDNISONE 10 MG PO TABS: ORAL_TABLET | ORAL | Status: DC

## 2012-11-24 MED ORDER — IPRATROPIUM-ALBUTEROL 0.5-2.5 (3) MG/3ML IN SOLN: 3.0000 mL | RESPIRATORY_TRACT | Status: DC | PRN

## 2012-11-24 NOTE — Patient Instructions (Addendum)
Script printed for prednisone taper to use only if needed for asthma  Script printed for ipratropium-albuterol neb solution to use instead of your plain albuterol nebulizer solution, if needed  Please call as needed

## 2012-11-24 NOTE — Progress Notes (Signed)
Patient ID: Kelly Butler, female    DOB: 08-08-1960, 52 y.o.   MRN: [id number]  HPI 01/11/11- 17 yo F former smoker followed for Asthma, Allergic rhinitis, GERD, complicated by GERD, VCD, chronic headache.     Female companion here Last here June 10., 2011- note reviewed.  Today complains of persistent frontal headache for several days w/o numbness, syncope, vision change. She will address this with her family doctor.  "Sort of stopped up in head" in the mornings, not blowing out anything. At least once it got better for awhile after she came here for allergy shot. We restarted allergy shots - currently at 1:500 [name]. Dose adjusted prn by allergy lab for local reaction. She feels the shots help. Some wheeze. Not using her Advair- says no refill. . Uses rescue inhaler or her nebulizer. Says  clarinex worked much better than otc antihistamine.   02/05/11- 62 yo F former smoker followed for Asthma, Allergic rhinitis, GERD, complicated by GERD, VCD, chronic headache.     2 Female companions (sons?) here She says she is doing better , but that she stays in to avoid summer heat. Antibiotic Z pak  last time opened nose and permitted drainage, and reduced headache. Notes some HA and orthostatic light headedness have returned. Has resumed Advair and says prednisone helped as well. Notes only a little wheeze. Uses nebulizer twice daily and rescue inhaler 3-4 x/ day.  Allergy vaccine does help- Holding at 1:500.  Barefoot in grass made her feet itch badly. Does take claritin.    05/13/11- 50 yo F former smoker followed for Asthma, Allergic rhinitis, GERD, complicated by GERD, VCD, chronic headache.   Has had flu vaccine. Complains of chronic and sustained substernal chest pains, shortness of breath, headaches and fibromyalgia. Finished a prednisone taper yesterday. Occasional left arm aching, numbness and tingling. The chest pain is described as sharp, steady and lasting 5 or 6 minutes at a time. It is unrelated to  exertion or position but she definitely feels reflux. Asthma comes and goes. Nebulizer machine helps the most. At night she will toss and turn, can't sleep, uncomfortable. She continues allergy vaccine.  04/18/12- 32 yo F former smoker followed for Asthma, Allergic rhinitis, GERD, complicated by GERD, VCD, chronic headache. Has had flu shot ER visit this summer for "swelling". Was told there that she seemed to have sleep apnea. Son here confirms that she snores loudly and stops breathing. She asks about sleep study.  Had more asthma this summer blamed on wet weather but better now. Complains of frontal and maxillary sinus pressure.  She drop-off of allergy shots because of local reactions although they did help.  05/30/12- 63 yo F former smoker followed for Asthma, Allergic rhinitis, GERD, complicated by GERD, VCD, chronic headache. Has had flu shot FOLLOWS FOR: discuss sleep study with patient and options to restart allergy vaccine Recent cold as part more frequent wheeze. Clear secretions are not purulent and she denies fever or sore throat. NPSG 05/12/12- AHI 5.8/ hr, weight 209 lbs. minimal obstructive sleep apnea. Not enough for CPAP; weight loss would be more appropriate. We reviewed her sleep study. Her primary complaint is insomnia with difficulty initiating and maintaining sleep. We discussed sleep hygiene and use of medication. She wants to try Ambien  09/04/12- 50 yo F former smoker followed for Asthma, Allergic rhinitis, GERD, complicated by GERD, VCD, chronic headache. Has had flu shot FOLLOWS FWY:OVZC to see about restarting allergy shots or not. Radford Pax and  feels like it has helped better than any other inhalers in the past. We talked about allergy vaccine. She had trouble sticking with it in the past. If she can manage the cost of Breo and it provides control , we agreed to give that a good try first.  11/24/12- 13 yo F former smoker followed for Asthma, Allergic rhinitis, GERD,  complicated by GERD, VCD, glaucoma, chronic headache, insomnia    Here w/ son FOLLOWS FOR: SOB and wheezing x 1 month or so; just making do with Breo inhaler-cant tell its helping. Blames pollen, not a cold. Breo little help. Neb helps with double albuterol. CT chest 08/23/12 IMPRESSION:  1. No CT findings for pulmonary embolism.  2. Mild cardiac enlargement.  3. Normal thoracic aorta.  4. Mosaic attenuation pattern in the lungs along with areas of  atelectasis as discussed above. No focal infiltrate or effusion.  5. Marked fatty infiltration of the liver.  Original Report Authenticated By: Rudie Meyer, M.D.  Review of Systems-see HPI Constitutional:      +weight gain, no-night sweats, fevers, chills, fatigue, lassitude. HEENT:   +  headaches,  No-difficulty swallowing, tooth/dental problems, sore throat,       No-  sneezing, itching, ear ache, +nasal congestion, post nasal drip,  CV:  No- chest pain,  No-orthopnea, PND, swelling in lower extremities, anasarca,  dizziness, palpitations Resp: + shortness of breath with exertion or at rest.              No-   productive cough,  + non-productive cough,  No- coughing up of blood.              No-   change in color of mucus.  + wheezing.   Skin: No-   rash or lesions. GI:  No-   heartburn, indigestion, abdominal pain, nausea, vomiting,  GU:  MS:  +  joint pain or swelling.  Neuro-     nothing unusual Psych:  No- change in mood or affect. No depression or anxiety.  No memory loss.  Objective:   Physical Exam General- Alert, Oriented, Affect-appropriate, Distress- none acute,  overweight Skin- rash-none, lesions- none, excoriation- none Lymphadenopathy- none Head- atraumatic            Eyes- Gross vision intact, PERRLA, conjunctivae clear secretions            Ears- Hearing, canals normal            Nose- Clear, No-Septal dev, mucus, polyps, erosion, perforation             Throat- Mallampati III-IV ,  drainage- none, tonsils- atrophic.   Neck- flexible , trachea midline, no stridor , thyroid nl, carotid no bruit Chest - symmetrical excursion , unlabored           Heart/CV- RRR , 1/6 SEM murmur , no gallop  , no rub, nl s1 s2                           - JVD- none , edema- none, stasis changes- none, varices- none           Lung- clear to P&A, wheeze- none, cough- none , dullness-none, rub- none           Chest wall-  Abd-  Br/ Gen/ Rectal- Not done, not indicated Extrem- cyanosis- none, clubbing, none, atrophy- none, strength- nl Neuro- grossly intact to observation

## 2012-12-08 NOTE — Assessment & Plan Note (Signed)
Minor symptoms at this visit

## 2012-12-08 NOTE — Assessment & Plan Note (Signed)
Plan- Use up and quit Breo. Rx Duoneb for nebulizer. Prednisone taper with steroid talk.

## 2012-12-12 ENCOUNTER — Other Ambulatory Visit: Payer: Self-pay | Admitting: Internal Medicine

## 2012-12-13 NOTE — Telephone Encounter (Signed)
Ok to refill 

## 2012-12-13 NOTE — Telephone Encounter (Signed)
Please advise if okay to refill. Thanks.  

## 2012-12-14 NOTE — Telephone Encounter (Signed)
Called refill to pharmacy voicemail.  

## 2013-01-22 ENCOUNTER — Other Ambulatory Visit: Payer: Self-pay | Admitting: Internal Medicine

## 2013-01-22 NOTE — Telephone Encounter (Signed)
Ok to refill 

## 2013-01-22 NOTE — Telephone Encounter (Signed)
Please advise if okay to refill. Thanks.  

## 2013-02-28 ENCOUNTER — Telehealth: Payer: Self-pay | Admitting: Internal Medicine

## 2013-02-28 NOTE — Telephone Encounter (Signed)
lmomtcb x1 

## 2013-03-01 ENCOUNTER — Other Ambulatory Visit: Payer: Self-pay | Admitting: Internal Medicine

## 2013-03-01 NOTE — Telephone Encounter (Signed)
ATC x 2-phone rang and no way to leave message.        

## 2013-03-02 NOTE — Telephone Encounter (Signed)
ATC number given, NA, no voicemail. ATC mobile # listed and message states # is not valid. WCB. I [id number] with pt emergency contact.  What med needs to be refilled? Carron Curie, CMA

## 2013-03-06 NOTE — Telephone Encounter (Signed)
Ok to refill 

## 2013-03-06 NOTE — Telephone Encounter (Signed)
05-28-13 is next OV with CY; Please advise if okay to refill. Thanks.

## 2013-03-06 NOTE — Telephone Encounter (Signed)
Nothing needed per pt. Carron Curie, CMA

## 2013-03-16 ENCOUNTER — Telehealth: Payer: Self-pay | Admitting: Internal Medicine

## 2013-03-16 NOTE — Telephone Encounter (Signed)
According to last OV pt was to finish using up the breo and then stop it. I called [facility] to make them aware. They were going off automatic refill request. Nothing further needed

## 2013-04-12 ENCOUNTER — Telehealth: Payer: Self-pay | Admitting: Internal Medicine

## 2013-04-12 NOTE — Telephone Encounter (Signed)
ATC NA and no option to leave a msg, WCB.  

## 2013-04-12 NOTE — Telephone Encounter (Signed)
Per CY-okay for patient and her son to get flu shots here; place on injection schedule as they are able to come in. Thanks.

## 2013-04-12 NOTE — Telephone Encounter (Signed)
Pt wanted to check with Dr. Maple Hudson to make sure it was ok for her and her son Kelly Butler( DOB 03/31/95 also a CY pt)  to have a flu shot. She denies any current infection symptoms. If OK,  she wants to come on 05-01-13 for injection. Please advise. Carron Curie, CMA

## 2013-04-16 NOTE — Telephone Encounter (Signed)
Spoke with patient; both she and her son (per CY) have been placed on injection schedule for 05-01-13 to get Flu shot. Kelly Butler did ask about Kelly Butler getting the PNA vaccine-I suggested she speak with his PCP about that. She verbalized her understanding. Nothing more needed at this time.

## 2013-04-23 ENCOUNTER — Other Ambulatory Visit: Payer: Self-pay

## 2013-04-23 DIAGNOSIS — Z1231 Encounter for screening mammogram for malignant neoplasm of breast: Secondary | ICD-10-CM

## 2013-05-01 ENCOUNTER — Ambulatory Visit (INDEPENDENT_AMBULATORY_CARE_PROVIDER_SITE_OTHER): Payer: Medicaid Other

## 2013-05-01 DIAGNOSIS — Z23 Encounter for immunization: Secondary | ICD-10-CM

## 2013-05-22 ENCOUNTER — Other Ambulatory Visit: Payer: Self-pay | Admitting: Internal Medicine

## 2013-05-24 ENCOUNTER — Telehealth: Payer: Self-pay | Admitting: *Deleted

## 2013-05-24 NOTE — Telephone Encounter (Signed)
PA # [id number] Id #: [id number] Called initiated PA. Was advised they will have to send this for clinical review. PA # to f/u on is [id number]. Will forward to [place] to f/u on

## 2013-05-28 ENCOUNTER — Ambulatory Visit: Payer: Medicaid Other | Admitting: Internal Medicine

## 2013-06-04 ENCOUNTER — Ambulatory Visit: Payer: Medicaid Other

## 2013-06-06 ENCOUNTER — Other Ambulatory Visit: Payer: Self-pay | Admitting: Internal Medicine

## 2013-06-06 NOTE — Telephone Encounter (Signed)
Can this be closed? thanks 

## 2013-06-08 NOTE — Telephone Encounter (Signed)
Called and was told by rep at [facility] that Rx was approved from  05-24-13 through 05-24-15. I spoke with Ebony Cargo at [facility] and they are aware of approval. Spoke with patient as well.

## 2013-06-18 ENCOUNTER — Other Ambulatory Visit: Payer: Self-pay | Admitting: Internal Medicine

## 2013-06-22 NOTE — Telephone Encounter (Signed)
CY please advise if okay to send refill; patient last seen 11-2012 and told to follow up 05-2013; no ov then. Thanks.

## 2013-06-24 NOTE — Telephone Encounter (Signed)
Ok to refill now and x 5 

## 2013-06-25 NOTE — Telephone Encounter (Signed)
Called refill to pharmacy voicemail.  

## 2013-07-09 ENCOUNTER — Ambulatory Visit
Admission: RE | Admit: 2013-07-09 | Discharge: 2013-07-09 | Disposition: A | Discharge status: Home / Self Care | Payer: Medicaid Other | Source: Ambulatory Visit

## 2013-07-09 ENCOUNTER — Ambulatory Visit: Payer: Medicaid Other

## 2013-07-09 DIAGNOSIS — Z1231 Encounter for screening mammogram for malignant neoplasm of breast: Secondary | ICD-10-CM

## 2013-07-12 ENCOUNTER — Encounter (INDEPENDENT_AMBULATORY_CARE_PROVIDER_SITE_OTHER): Payer: Self-pay

## 2013-07-12 ENCOUNTER — Ambulatory Visit (INDEPENDENT_AMBULATORY_CARE_PROVIDER_SITE_OTHER): Payer: Medicaid Other | Admitting: Internal Medicine

## 2013-07-12 ENCOUNTER — Encounter: Payer: Self-pay | Admitting: Internal Medicine

## 2013-07-12 VITALS — BP 112/70 | HR 98 | Ht 60.0 in | Wt 211.0 lb

## 2013-07-12 DIAGNOSIS — J301 Allergic rhinitis due to pollen: Secondary | ICD-10-CM

## 2013-07-12 DIAGNOSIS — K219 Gastro-esophageal reflux disease without esophagitis: Secondary | ICD-10-CM

## 2013-07-12 DIAGNOSIS — J45998 Other asthma: Secondary | ICD-10-CM

## 2013-07-12 DIAGNOSIS — J45909 Unspecified asthma, uncomplicated: Secondary | ICD-10-CM

## 2013-07-12 MED ORDER — MOMETASONE FURO-FORMOTEROL FUM 100-5 MCG/ACT IN AERO: INHALATION_SPRAY | RESPIRATORY_TRACT | Status: DC

## 2013-07-12 MED ORDER — EPINEPHRINE 0.3 MG/0.3ML IJ SOAJ: INTRAMUSCULAR | Status: DC

## 2013-07-12 MED ORDER — IPRATROPIUM-ALBUTEROL 0.5-2.5 (3) MG/3ML IN SOLN: 3.0000 mL | RESPIRATORY_TRACT | Status: DC | PRN

## 2013-07-12 MED ORDER — ALBUTEROL SULFATE HFA 108 (90 BASE) MCG/ACT IN AERS: INHALATION_SPRAY | RESPIRATORY_TRACT | Status: DC

## 2013-07-12 NOTE — Patient Instructions (Signed)
Prescription refills sent  Try raising the head of your bed with a brick under each leg to help reduce reflux from your stomach. You can still use pillows as needed as well.   Sample and card for [facility] 100 if available 2 puffs then rinse mouth, twice daily

## 2013-07-12 NOTE — Progress Notes (Signed)
Patient ID: Kelly Butler, female    DOB: 08-08-1960, 53 y.o.   MRN: [id number]  HPI 01/11/11- 53 yo F former smoker followed for Asthma, Allergic rhinitis, GERD, complicated by GERD, VCD, chronic headache.     Female companion here Last here June 10., 2011- note reviewed.  Today complains of persistent frontal headache for several days w/o numbness, syncope, vision change. She will address this with her family doctor.  "Sort of stopped up in head" in the mornings, not blowing out anything. At least once it got better for awhile after she came here for allergy shot. We restarted allergy shots - currently at 1:500 [name]. Dose adjusted prn by allergy lab for local reaction. She feels the shots help. Some wheeze. Not using her Advair- says no refill. . Uses rescue inhaler or her nebulizer. Says  clarinex worked much better than otc antihistamine.   02/05/11- 53 yo F former smoker followed for Asthma, Allergic rhinitis, GERD, complicated by GERD, VCD, chronic headache.     2 Female companions (sons?) here She says she is doing better , but that she stays in to avoid summer heat. Antibiotic Z pak  last time opened nose and permitted drainage, and reduced headache. Notes some HA and orthostatic light headedness have returned. Has resumed Advair and says prednisone helped as well. Notes only a little wheeze. Uses nebulizer twice daily and rescue inhaler 3-4 x/ day.  Allergy vaccine does help- Holding at 1:500.  Barefoot in grass made her feet itch badly. Does take claritin.    05/13/11- 53 yo F former smoker followed for Asthma, Allergic rhinitis, GERD, complicated by GERD, VCD, chronic headache.   Has had flu vaccine. Complains of chronic and sustained substernal chest pains, shortness of breath, headaches and fibromyalgia. Finished a prednisone taper yesterday. Occasional left arm aching, numbness and tingling. The chest pain is described as sharp, steady and lasting 5 or 6 minutes at a time. It is unrelated to  exertion or position but she definitely feels reflux. Asthma comes and goes. Nebulizer machine helps the most. At night she will toss and turn, can't sleep, uncomfortable. She continues allergy vaccine.  04/18/12- 53 yo F former smoker followed for Asthma, Allergic rhinitis, GERD, complicated by GERD, VCD, chronic headache. Has had flu shot ER visit this summer for "swelling". Was told there that she seemed to have sleep apnea. Son here confirms that she snores loudly and stops breathing. She asks about sleep study.  Had more asthma this summer blamed on wet weather but better now. Complains of frontal and maxillary sinus pressure.  She drop-off of allergy shots because of local reactions although they did help.  05/30/12- 53 yo F former smoker followed for Asthma, Allergic rhinitis, GERD, complicated by GERD, VCD, chronic headache. Has had flu shot FOLLOWS FOR: discuss sleep study with patient and options to restart allergy vaccine Recent cold as part more frequent wheeze. Clear secretions are not purulent and she denies fever or sore throat. NPSG 05/12/12- AHI 5.8/ hr, weight 209 lbs. minimal obstructive sleep apnea. Not enough for CPAP; weight loss would be more appropriate. We reviewed her sleep study. Her primary complaint is insomnia with difficulty initiating and maintaining sleep. We discussed sleep hygiene and use of medication. She wants to try Ambien  09/04/12- 53 yo F former smoker followed for Asthma, Allergic rhinitis, GERD, complicated by GERD, VCD, chronic headache. Has had flu shot FOLLOWS FWY:OVZC to see about restarting allergy shots or not. Radford Pax and  feels like it has helped better than any other inhalers in the past. We talked about allergy vaccine. She had trouble sticking with it in the past. If she can manage the cost of Breo and it provides control , we agreed to give that a good try first.  11/24/12- 53 yo F former smoker followed for Asthma, Allergic rhinitis, GERD,  complicated by GERD, VCD, glaucoma, chronic headache, insomnia    Here w/ son FOLLOWS FOR: SOB and wheezing x 1 month or so; just making do with Breo inhaler-cant tell its helping. Blames pollen, not a cold. Breo little help. Neb helps with double albuterol. CT chest 08/23/12 IMPRESSION:  1. No CT findings for pulmonary embolism.  2. Mild cardiac enlargement.  3. Normal thoracic aorta.  4. Mosaic attenuation pattern in the lungs along with areas of  atelectasis as discussed above. No focal infiltrate or effusion.  5. Marked fatty infiltration of the liver.  Original Report Authenticated By: Marijo Sanes, M.D.  07/12/13- 53 yo F former smoker followed for Asthma, Allergic rhinitis, GERD, complicated by GERD, VCD, glaucoma, chronic headache, insomnia    Here w/ son FOLLOWS FOR: Breathing is unchanged. Had an asthma attack several days ago. Reports waking up choking at night.  chronic orthopnea-extra pillows. Aware of reflux at night. Increase wheezing with a cold recently and still feels tighter than usual. Out of medications. Did not like Breo.  Review of Systems-see HPI Constitutional:      +weight gain, no-night sweats, fevers, chills, fatigue, lassitude. HEENT:   +  headaches,  No-difficulty swallowing, tooth/dental problems, sore throat,       No-  sneezing, itching, ear ache, +nasal congestion, post nasal drip,  CV:  No- chest pain,  No-orthopnea, PND, swelling in lower extremities, anasarca,  dizziness, palpitations Resp: + shortness of breath with exertion or at rest.              No-   productive cough,  + non-productive cough,  No- coughing up of blood.              No-   change in color of mucus.  + wheezing.   Skin: No-   rash or lesions. GI: +   heartburn, indigestion, no-abdominal pain, nausea, vomiting,  GU:  MS:  +  joint pain or swelling.  Neuro-     nothing unusual Psych:  No- change in mood or affect. No depression or anxiety.  No memory loss.  Objective:   Physical  Exam General- Alert, Oriented, Affect-appropriate, Distress- none acute,  overweight Skin- rash-none, lesions- none, excoriation- none Lymphadenopathy- none Head- atraumatic            Eyes- Gross vision intact, PERRLA, conjunctivae clear secretions            Ears- Hearing, canals normal            Nose- Clear, No-Septal dev, mucus, polyps, erosion, perforation             Throat- Mallampati III-IV ,  drainage- none, tonsils- atrophic.  Neck- flexible , trachea midline, no stridor , thyroid nl, carotid no bruit Chest - symmetrical excursion , unlabored           Heart/CV- RRR , 1/6 SEM murmur , no gallop  , no rub, nl s1 s2                           - JVD- none ,  edema- none, stasis changes- none, varices- none           Lung- clear to P&A, wheeze- none, cough- slight , dullness-none, rub- none           Chest wall-  Abd-  Br/ Gen/ Rectal- Not done, not indicated Extrem- cyanosis- none, clubbing, none, atrophy- none, strength- nl Neuro- grossly intact to observation

## 2013-07-13 ENCOUNTER — Encounter: Payer: Self-pay | Admitting: Internal Medicine

## 2013-07-13 ENCOUNTER — Telehealth: Payer: Self-pay | Admitting: *Deleted

## 2013-07-13 ENCOUNTER — Other Ambulatory Visit: Payer: Self-pay | Admitting: Internal Medicine

## 2013-07-13 ENCOUNTER — Other Ambulatory Visit: Payer: Self-pay | Admitting: *Deleted

## 2013-07-13 MED ORDER — DESLORATADINE 5 MG PO TABS: 5.0000 mg | ORAL_TABLET | Freq: Every day | ORAL | Status: DC

## 2013-07-13 MED ORDER — MONTELUKAST SODIUM 10 MG PO TABS: 10.0000 mg | ORAL_TABLET | Freq: Every morning | ORAL | Status: DC

## 2013-07-13 NOTE — Telephone Encounter (Signed)
CY pt sent e-mail stating that she feels Ruthe Mannan is working well for her; please advise if okay to send Rx. Thanks.

## 2013-07-13 NOTE — Telephone Encounter (Signed)
Per CY-okay to Rx Dulera 100/5 #1 2 puffs BID and rinse mouth with prn refills. Thanks.

## 2013-07-13 NOTE — Telephone Encounter (Signed)
Received faxed Prior Auth request from [facility] for montelukast and generic clarinex. Called [place] [facility] at [phone], spoke with Normin.   Per Narmin, the PA for montelukast was an error -- no PA is needed for this.  It should now go through at pharm. [facility] aware. Narmin states preferred for desloratadine (clarinex) are "ceterizine otc tab" or "loratidine otc tabs."  Dr. [name], pls advise if this can be changed? Thank you.

## 2013-07-15 NOTE — Telephone Encounter (Signed)
Yes - suggest change to loratadine per insurance requirement.

## 2013-07-16 ENCOUNTER — Encounter: Payer: Self-pay | Admitting: Internal Medicine

## 2013-07-16 ENCOUNTER — Telehealth: Payer: Self-pay | Admitting: Internal Medicine

## 2013-07-16 MED ORDER — LORATADINE 10 MG PO TABS: 10.0000 mg | ORAL_TABLET | Freq: Every day | ORAL | Status: DC

## 2013-07-16 NOTE — Telephone Encounter (Signed)
lmtcb x1 

## 2013-07-16 NOTE — Telephone Encounter (Signed)
Called, spoke with pt.  She is aware she should now be able to pick up the singulair at pharm with no problems.  Advised generic clarinex is on the non preferred list and CY recs she try loratadine.  She is ok with this, is aware [facility], and is to call back if she has any problems with this medication or it doesn't work.

## 2013-07-16 NOTE — Telephone Encounter (Signed)
MyChart Message:    Appointment Request From: [place]  With Provider: Deneise Lever, MD [facility]]  Preferred Date Range: From 07/13/2013 To 08/01/2013  Preferred Times: Wednesday Morning, Wednesday Afternoon, Thursday Afternoon, Friday Afternoon  Reason: To address the following health maintenance concerns. Tetanus/Tdap  Comments: Requesting Vaccine be done by Dr. Glynn Octave

## 2013-07-17 NOTE — Telephone Encounter (Signed)
No need for appt for this. Just whatever strings you need to pull.

## 2013-07-17 NOTE — Telephone Encounter (Signed)
Pt returned triage's call.  Holly D Pryor ° °

## 2013-07-17 NOTE — Telephone Encounter (Signed)
CY pt is requesting to come in for tdap.   Will pt need appt with  You for this or are you ok to let her come in and have this done without an appt?    Last ov--07/12/2013 Next ov--01/09/2014  Allergies  Allergen Reactions  . Celebrex [Celecoxib] Swelling  . Pregabalin     *LYRICA* REACTION: itching, swelling  . Sulfa Antibiotics     Current Outpatient Prescriptions on File Prior to Visit  Medication Sig Dispense Refill  . albuterol (VENTOLIN HFA) 108 (90 BASE) MCG/ACT inhaler 2 puffs every 4 hours if needed - rescue  1 Inhaler  prn  . ALPRAZolam (XANAX) 0.5 MG tablet Take 0.5 mg by mouth 3 (three) times daily as needed (anxiety). Anxiety.      . enalapril (VASOTEC) 5 MG tablet Take 1 tablet (5 mg total) by mouth daily.      [name] EPINEPHrine (EPIPEN 2-PAK) 0.3 mg/0.3 mL SOAJ injection Inject into thigh if needed for severe asthma or allergic attack  1 Device  prn  . ipratropium-albuterol (DUONEB) 0.5-2.5 (3) MG/3ML SOLN Take 3 mLs by nebulization every 4 (four) hours as needed.  360 mL  prn  . loratadine (CLARITIN) 10 MG tablet Take 1 tablet (10 mg total) by mouth daily.  30 tablet  5  . mometasone-formoterol (DULERA) 100-5 MCG/ACT AERO 2 puffs then rinse mouth, twice daily- maintenance  1 Inhaler  prn  . montelukast (SINGULAIR) 10 MG tablet Take 1 tablet (10 mg total) by mouth every morning.  30 tablet  5  . omeprazole (PRILOSEC) 40 MG capsule Take 40 mg by mouth 2 (two) times daily.        . promethazine (PHENERGAN) 25 MG tablet Take 25 mg by mouth every 6 (six) hours as needed (NAUSEA). Nausea.      . traMADol (ULTRAM) 50 MG tablet Take 100 mg by mouth every 8 (eight) hours as needed (PAIN).      [name] zolpidem (AMBIEN) 10 MG tablet TAKE 1 TABLET AT BEDTIME AS NEEDED FOR SLEEP  30 tablet  5   No current facility-administered medications on file prior to visit.

## 2013-07-17 NOTE — Telephone Encounter (Signed)
lmomtcb x 1 to make pt aware of CY recs.

## 2013-07-18 ENCOUNTER — Ambulatory Visit (INDEPENDENT_AMBULATORY_CARE_PROVIDER_SITE_OTHER): Payer: Medicaid Other

## 2013-07-18 DIAGNOSIS — Z23 Encounter for immunization: Secondary | ICD-10-CM

## 2013-07-18 NOTE — Telephone Encounter (Signed)
Called and spoke with pt and she is aware of CY recs.  She will come in today for the tdap.  Nothing further is needed.

## 2013-07-19 ENCOUNTER — Encounter: Payer: Self-pay | Admitting: Internal Medicine

## 2013-07-26 ENCOUNTER — Encounter: Payer: Self-pay | Admitting: Internal Medicine

## 2013-08-05 ENCOUNTER — Encounter: Payer: Self-pay | Admitting: Internal Medicine

## 2013-08-05 NOTE — Assessment & Plan Note (Signed)
Watch as season changes

## 2013-08-05 NOTE — Assessment & Plan Note (Signed)
She ran out of medications and she has ongoing reflux. Both of these are issues significant for control of her asthma. Education provided. Plan-sample and prescription with discount card for [facility] maintenance inhaler, elevate head of bed with break, refill routine meds

## 2013-08-05 NOTE — Assessment & Plan Note (Signed)
Emphasized reflux precautions and use of over-the-counter acid blocker

## 2013-08-06 ENCOUNTER — Other Ambulatory Visit: Payer: Self-pay | Admitting: Internal Medicine

## 2013-08-13 ENCOUNTER — Encounter: Payer: Self-pay | Admitting: Internal Medicine

## 2013-08-13 MED ORDER — ALBUTEROL SULFATE HFA 108 (90 BASE) MCG/ACT IN AERS: INHALATION_SPRAY | RESPIRATORY_TRACT | Status: DC

## 2013-08-13 NOTE — Telephone Encounter (Signed)
Please let patient know that the referral will need to come from her PCP as her insurance will not allow [place] to do the referral. Any of  GI drs are great. Thanks.

## 2013-08-13 NOTE — Telephone Encounter (Signed)
CY - please advise on pt's request for a referral to another GI MD.  Thanks.

## 2013-09-05 ENCOUNTER — Other Ambulatory Visit: Payer: Self-pay | Admitting: Internal Medicine

## 2013-09-06 NOTE — Telephone Encounter (Signed)
Ok to refill 

## 2013-09-06 NOTE — Telephone Encounter (Signed)
CY, Please advise if okay to refill. Thanks.  

## 2013-09-07 NOTE — Telephone Encounter (Signed)
Called refill to pharmacy voicemail.  

## 2013-09-13 ENCOUNTER — Telehealth: Payer: Self-pay

## 2013-09-13 NOTE — Telephone Encounter (Signed)
Prior authorization for Zolpidem initiated.  Request received from [facility].  Will route to nurse for follow-up.

## 2013-09-14 ENCOUNTER — Encounter: Payer: Self-pay | Admitting: Internal Medicine

## 2013-10-18 NOTE — Telephone Encounter (Signed)
Original PA documents were sent to the wrong insurance/pharmacy. I have completed the correct for([place] [employer]) and given to CY to complete questions I am not able to answer and sign. I will fax back as URGENT request once completed.

## 2013-10-25 NOTE — Telephone Encounter (Signed)
CY signed PA forms and I have faxed back to [phone]. Will wait for an approval/denial from insurance company.

## 2013-10-27 ENCOUNTER — Other Ambulatory Visit: Payer: Self-pay | Admitting: Internal Medicine

## 2013-10-31 NOTE — Telephone Encounter (Signed)
Per last ov at 1.8.15, pt stopped Breo because she did not like it and was started on [place].  [place] was removed from med list.

## 2013-11-07 NOTE — Telephone Encounter (Signed)
I spoke with [employer]-stated that patients med was approved from 10-25-13 through 11-24-13 (only 1 month); they state patient was aware of approval as well. We do not receive fax of approval either.

## 2013-12-28 ENCOUNTER — Other Ambulatory Visit: Payer: Self-pay | Admitting: Internal Medicine

## 2013-12-28 NOTE — Telephone Encounter (Signed)
Last refill for ambien 09/07/13 #30 x 2 refills Please advise Dr. [name] thanks

## 2013-12-28 NOTE — Telephone Encounter (Signed)
Ok refill zolpidem total 6 months

## 2014-01-07 NOTE — Telephone Encounter (Signed)
Called refill to pharmacy voicemail.  

## 2014-01-07 NOTE — Telephone Encounter (Signed)
Ok to refill 6 months 

## 2014-01-08 ENCOUNTER — Other Ambulatory Visit: Payer: Self-pay | Admitting: Internal Medicine

## 2014-01-09 ENCOUNTER — Ambulatory Visit: Payer: Medicaid Other | Admitting: Internal Medicine

## 2014-01-20 ENCOUNTER — Other Ambulatory Visit: Payer: Self-pay | Admitting: Internal Medicine

## 2014-02-18 ENCOUNTER — Other Ambulatory Visit: Payer: Self-pay | Admitting: Internal Medicine

## 2014-02-21 ENCOUNTER — Ambulatory Visit: Payer: Medicaid Other | Admitting: Internal Medicine

## 2014-03-10 ENCOUNTER — Other Ambulatory Visit: Payer: Self-pay | Admitting: Internal Medicine

## 2014-03-12 ENCOUNTER — Other Ambulatory Visit: Payer: Self-pay | Admitting: Internal Medicine

## 2014-03-12 MED ORDER — MOMETASONE FURO-FORMOTEROL FUM 100-5 MCG/ACT IN AERO: INHALATION_SPRAY | RESPIRATORY_TRACT | Status: DC

## 2014-03-12 NOTE — Telephone Encounter (Signed)
Pt is requesting a refill on vasotec. Can;t tell this has been refilled by [facility]. Please advise thanks

## 2014-03-12 NOTE — Telephone Encounter (Signed)
This is to be filled and managed by her PCP please

## 2014-03-12 NOTE — Addendum Note (Signed)
Addended by: Virl Cagey on: 03/12/2014 09:24 AM   Modules accepted: Orders

## 2014-03-18 ENCOUNTER — Telehealth: Payer: Self-pay | Admitting: Internal Medicine

## 2014-03-18 DIAGNOSIS — J45909 Unspecified asthma, uncomplicated: Secondary | ICD-10-CM

## 2014-03-18 MED ORDER — IPRATROPIUM-ALBUTEROL 0.5-2.5 (3) MG/3ML IN SOLN: RESPIRATORY_TRACT | Status: DC

## 2014-03-18 NOTE — Telephone Encounter (Signed)
Called and spoke with pt and she stated that she needs an order sent over to [facility] to get a new nebulizer machine and meds.  CY please advise. Thanks  Last ov--07/12/2013 Next ov--04/08/2014  Allergies  Allergen Reactions  . Celebrex [Celecoxib] Swelling  . Pregabalin     *LYRICA* REACTION: itching, swelling  . Sulfa Antibiotics     Current Outpatient Prescriptions on File Prior to Visit  Medication Sig Dispense Refill  . albuterol (VENTOLIN HFA) 108 (90 BASE) MCG/ACT inhaler 2 puffs every 4 hours if needed - rescue  1 Inhaler  prn  . ALPRAZolam (XANAX) 0.5 MG tablet Take 0.5 mg by mouth 3 (three) times daily as needed (anxiety). Anxiety.      . enalapril (VASOTEC) 5 MG tablet Take 1 tablet (5 mg total) by mouth daily.      [name] EPIPEN 2-PAK 0.3 MG/0.3ML SOAJ injection INJECT 0.3 MLS INTO THE MUSCLE ONCE  1 Device  11  . ipratropium-albuterol (DUONEB) 0.5-2.5 (3) MG/3ML SOLN USE 1 VIAL IN NEBULIZER EVERY 4 HOURS AS NEEDED  360 mL  2  . loratadine (CLARITIN) 10 MG tablet Take 1 tablet (10 mg total) by mouth daily.  30 tablet  5  . mometasone-formoterol (DULERA) 100-5 MCG/ACT AERO 2 puffs then rinse mouth, twice daily- maintenance  1 Inhaler  prn  . montelukast (SINGULAIR) 10 MG tablet Take 1 tablet (10 mg total) by mouth every morning.  30 tablet  5  . omeprazole (PRILOSEC) 40 MG capsule Take 40 mg by mouth 2 (two) times daily.        . promethazine (PHENERGAN) 25 MG tablet Take 25 mg by mouth every 6 (six) hours as needed (NAUSEA). Nausea.      . traMADol (ULTRAM) 50 MG tablet Take 100 mg by mouth every 8 (eight) hours as needed (PAIN).      [name] zolpidem (AMBIEN) 10 MG tablet TAKE 1 TABLET AT BEDTIME AS NEEDED FOR SLEEP  30 tablet  5   No current facility-administered medications on file prior to visit.

## 2014-03-18 NOTE — Telephone Encounter (Signed)
Ok to order compressor nebulizer, # 1, use as directed   And Duoneb, # 120, 1 every 4 hours if needed ref prn          For dx allergic- infective asthma   J445.998 (ICD 10 code)

## 2014-03-18 NOTE — Telephone Encounter (Signed)
My Chart Message copied below:    Appointment Request From: Kelly Butler  With Provider: Deneise Lever, MD [facility]]  Preferred Date Range: From 03/12/2014 To 04/19/2014  Preferred Times: Monday Morning, Wednesday Morning, Tuesday Afternoon  Reason: To address the following health maintenance concerns. Influenza Vaccine  Comments: I wish to request a refill of my medications to be sent to the pharmacy. I have had to take at lest 4 treatments a day due to shortness of breath and whizzing. I am not sure if I ate something or touched something outside and then touch my face but it broke out in a red swollen rash/hive. I took my allergy medication along with epi-pen and it began to clear up. I went to see my family doctor but he only said it was probably something I touch and then touch myself but everything seem to be alright no side effects from the epi-pen matter of fact he was glad I had it. I have been whizzing for some time now and I am not sure why nor where the whizzing is emanating from I can say my chest has been sore for almost a month it hurts when I cough and when I laugh. I am still having severe headaches that awaken me at night it feels like it around the nose and eye area and the back of head and neck nothing I take stops the pain right of. I am requesting that all my medications be refilled

## 2014-03-18 NOTE — Telephone Encounter (Signed)
Order sent to [facility]. Spoke with the pt and notified that this was done. Nothing further needed.  

## 2014-03-18 NOTE — Telephone Encounter (Signed)
See other msg dated today

## 2014-03-19 ENCOUNTER — Telehealth: Payer: Self-pay | Admitting: Internal Medicine

## 2014-03-19 NOTE — Telephone Encounter (Signed)
[facility] #360 mL x 2.   [facility]  Was advised they do have Aug 2015 rx on hold and is a "brand new rx."  Phile will process rx for pt and get med ready for pick.  Nothing further needed from our office. Called, spoke with.  Informed her of above.  She verbalized understanding and voiced no further questions or concerns at this time.

## 2014-03-19 NOTE — Telephone Encounter (Signed)
Referral placed for [facility] to provide new nebulizer machine and supplies, along with Duoneb 1 vial in neb every 4 hours.Order has been sent to [facility] to provide patient with a new nebulizer, however,  [facility] can not provide this patient with Duoneb due to patient's insurance. Medication must be called in to patient's local pharmacy. Rhonda J Cobb

## 2014-03-19 NOTE — Telephone Encounter (Signed)
Faxed Duoneb to [facility]. Nothing further needed.

## 2014-03-20 NOTE — Telephone Encounter (Signed)
This should be filled by her primary doctor

## 2014-03-30 ENCOUNTER — Other Ambulatory Visit: Payer: Self-pay | Admitting: Internal Medicine

## 2014-04-08 ENCOUNTER — Encounter: Payer: Self-pay | Admitting: Internal Medicine

## 2014-04-08 ENCOUNTER — Ambulatory Visit (INDEPENDENT_AMBULATORY_CARE_PROVIDER_SITE_OTHER): Payer: Medicaid Other | Admitting: Internal Medicine

## 2014-04-08 VITALS — BP 130/80 | HR 75 | Ht 60.0 in | Wt 216.2 lb

## 2014-04-08 DIAGNOSIS — J45998 Other asthma: Secondary | ICD-10-CM

## 2014-04-08 DIAGNOSIS — Z23 Encounter for immunization: Secondary | ICD-10-CM

## 2014-04-08 DIAGNOSIS — J301 Allergic rhinitis due to pollen: Secondary | ICD-10-CM

## 2014-04-08 DIAGNOSIS — K219 Gastro-esophageal reflux disease without esophagitis: Secondary | ICD-10-CM

## 2014-04-08 MED ORDER — MOMETASONE FURO-FORMOTEROL FUM 200-5 MCG/ACT IN AERO: INHALATION_SPRAY | RESPIRATORY_TRACT | Status: DC

## 2014-04-08 MED ORDER — ALBUTEROL SULFATE HFA 108 (90 BASE) MCG/ACT IN AERS: INHALATION_SPRAY | RESPIRATORY_TRACT | Status: DC

## 2014-04-08 MED ORDER — IPRATROPIUM-ALBUTEROL 0.5-2.5 (3) MG/3ML IN SOLN: RESPIRATORY_TRACT | Status: DC

## 2014-04-08 MED ORDER — EPINEPHRINE 0.3 MG/0.3ML IJ SOAJ: INTRAMUSCULAR | Status: DC

## 2014-04-08 MED ORDER — MONTELUKAST SODIUM 10 MG PO TABS: 10.0000 mg | ORAL_TABLET | Freq: Every morning | ORAL | Status: DC

## 2014-04-08 NOTE — Progress Notes (Signed)
Patient ID: Kelly Butler, female    DOB: 08-08-1960, 53 y.o.   MRN: [id number]  HPI 01/11/11- 17 yo F former smoker followed for Asthma, Allergic rhinitis, GERD, complicated by GERD, VCD, chronic headache.     Female companion here Last here June 10., 2011- note reviewed.  Today complains of persistent frontal headache for several days w/o numbness, syncope, vision change. She will address this with her family doctor.  "Sort of stopped up in head" in the mornings, not blowing out anything. At least once it got better for awhile after she came here for allergy shot. We restarted allergy shots - currently at 1:500 [name]. Dose adjusted prn by allergy lab for local reaction. She feels the shots help. Some wheeze. Not using her Advair- says no refill. . Uses rescue inhaler or her nebulizer. Says  clarinex worked much better than otc antihistamine.   02/05/11- 53 yo F former smoker followed for Asthma, Allergic rhinitis, GERD, complicated by GERD, VCD, chronic headache.     2 Female companions (sons?) here She says she is doing better , but that she stays in to avoid summer heat. Antibiotic Z pak  last time opened nose and permitted drainage, and reduced headache. Notes some HA and orthostatic light headedness have returned. Has resumed Advair and says prednisone helped as well. Notes only a little wheeze. Uses nebulizer twice daily and rescue inhaler 3-4 x/ day.  Allergy vaccine does help- Holding at 1:500.  Barefoot in grass made her feet itch badly. Does take claritin.    05/13/11- 53 yo F former smoker followed for Asthma, Allergic rhinitis, GERD, complicated by GERD, VCD, chronic headache.   Has had flu vaccine. Complains of chronic and sustained substernal chest pains, shortness of breath, headaches and fibromyalgia. Finished a prednisone taper yesterday. Occasional left arm aching, numbness and tingling. The chest pain is described as sharp, steady and lasting 5 or 6 minutes at a time. It is unrelated to  exertion or position but she definitely feels reflux. Asthma comes and goes. Nebulizer machine helps the most. At night she will toss and turn, can't sleep, uncomfortable. She continues allergy vaccine.  04/18/12- 53 yo F former smoker followed for Asthma, Allergic rhinitis, GERD, complicated by GERD, VCD, chronic headache. Has had flu shot ER visit this summer for "swelling". Was told there that she seemed to have sleep apnea. Son here confirms that she snores loudly and stops breathing. She asks about sleep study.  Had more asthma this summer blamed on wet weather but better now. Complains of frontal and maxillary sinus pressure.  She drop-off of allergy shots because of local reactions although they did help.  05/30/12- 53 yo F former smoker followed for Asthma, Allergic rhinitis, GERD, complicated by GERD, VCD, chronic headache. Has had flu shot FOLLOWS FOR: discuss sleep study with patient and options to restart allergy vaccine Recent cold as part more frequent wheeze. Clear secretions are not purulent and she denies fever or sore throat. NPSG 05/12/12- AHI 5.8/ hr, weight 209 lbs. minimal obstructive sleep apnea. Not enough for CPAP; weight loss would be more appropriate. We reviewed her sleep study. Her primary complaint is insomnia with difficulty initiating and maintaining sleep. We discussed sleep hygiene and use of medication. She wants to try Ambien  09/04/12- 53 yo F former smoker followed for Asthma, Allergic rhinitis, GERD, complicated by GERD, VCD, chronic headache. Has had flu shot FOLLOWS FWY:OVZC to see about restarting allergy shots or not. [place] Pax and  feels like it has helped better than any other inhalers in the past. We talked about allergy vaccine. She had trouble sticking with it in the past. If she can manage the cost of Breo and it provides control , we agreed to give that a good try first.  11/24/12- 53 yo F former smoker followed for Asthma, Allergic rhinitis, GERD,  complicated by GERD, VCD, glaucoma, chronic headache, insomnia    Here w/ son FOLLOWS FOR: SOB and wheezing x 1 month or so; just making do with Breo inhaler-cant tell its helping. Blames pollen, not a cold. Breo little help. Neb helps with double albuterol. CT chest 08/23/12 IMPRESSION:  1. No CT findings for pulmonary embolism.  2. Mild cardiac enlargement.  3. Normal thoracic aorta.  4. Mosaic attenuation pattern in the lungs along with areas of  atelectasis as discussed above. No focal infiltrate or effusion.  5. Marked fatty infiltration of the liver.  Original Report Authenticated By: Marijo Sanes, M.D.  07/12/13- 53 yo F former smoker followed for Asthma, Allergic rhinitis, GERD, complicated by GERD, VCD, glaucoma, chronic headache, insomnia    Here w/ son FOLLOWS FOR: Breathing is unchanged. Had an asthma attack several days ago. Reports waking up choking at night.  chronic orthopnea-extra pillows. Aware of reflux at night. Increase wheezing with a cold recently and still feels tighter than usual. Out of medications. Did not like Breo.  04/08/14-  76 yo F former smoker followed for Asthma, Allergic rhinitis, GERD, complicated by GERD, VCD, glaucoma, chronic headache, insomnia    Here w/ son FOLLOW UP: asthma, SOB today, took breathing treatment today, having some chest pain/tightness Vertex and occipital headache each morning. Denies nasal/sinus congestion or pressure. Daily wheeze. Note Enalapril/ ACEI  Review of Systems-see HPI Constitutional:      +weight gain, no-night sweats, fevers, chills, fatigue, lassitude. HEENT:   +  headaches,  No-difficulty swallowing, tooth/dental problems, sore throat,       No-  sneezing, itching, ear ache, +nasal congestion, post nasal drip,  CV:  No- chest pain,  No-orthopnea, PND, swelling in lower extremities, anasarca,  dizziness, palpitations Resp: + shortness of breath with exertion or at rest.              No-   productive cough,  +  non-productive cough,  No- coughing up of blood.              No-   change in color of mucus.  + wheezing.   Skin: No-   rash or lesions. GI: +   heartburn, indigestion, no-abdominal pain, nausea, vomiting,  GU:  MS:  +  joint pain or swelling.  Neuro-     nothing unusual Psych:  No- change in mood or affect. No depression or anxiety.  No memory loss.  Objective:   Physical Exam General- Alert, Oriented, Affect-appropriate, Distress- none acute,  overweight Skin- rash-none, lesions- none, excoriation- none Lymphadenopathy- none Head- atraumatic            Eyes- Gross vision intact, PERRLA, conjunctivae clear secretions            Ears- Hearing, canals normal            Nose- Clear, No-Septal dev, mucus, polyps, erosion, perforation             Throat- Mallampati III-IV ,  drainage- none, tonsils- atrophic.  Neck- flexible , trachea midline, no stridor , thyroid nl, carotid no bruit Chest - symmetrical excursion ,  unlabored           Heart/CV- RRR , 1/6 SEM murmur , no gallop  , no rub, nl s1 s2                           - JVD- none , edema- none, stasis changes- none, varices- none           Lung- + coarse, wheeze- none, cough- slight , dullness-none, rub- none           Chest wall-  Abd-  Br/ Gen/ Rectal- Not done, not indicated Extrem- cyanosis- none, clubbing, none, atrophy- none, strength- nl Neuro- grossly intact to observation

## 2014-04-08 NOTE — Patient Instructions (Signed)
Medication refills sent  Try raising the head of your bed with a brick under each head leg, to help your morning headaches and your heartburn  Flu vax

## 2014-04-16 NOTE — Assessment & Plan Note (Signed)
Plan-discussed GERD precautions and refilled her medications with medication talk

## 2014-04-16 NOTE — Assessment & Plan Note (Signed)
Good control during current season

## 2014-04-16 NOTE — Assessment & Plan Note (Signed)
Emphasized reflux precautions. Her son is going to help her elevate the head of her bed

## 2014-05-02 ENCOUNTER — Other Ambulatory Visit: Payer: Self-pay | Admitting: Internal Medicine

## 2014-05-17 ENCOUNTER — Encounter: Payer: Self-pay | Admitting: Internal Medicine

## 2014-06-03 ENCOUNTER — Other Ambulatory Visit (HOSPITAL_COMMUNITY): Payer: Self-pay | Admitting: Obstetrics

## 2014-06-03 DIAGNOSIS — N949 Unspecified condition associated with female genital organs and menstrual cycle: Secondary | ICD-10-CM

## 2014-06-06 ENCOUNTER — Ambulatory Visit (HOSPITAL_COMMUNITY)
Admission: RE | Admit: 2014-06-06 | Discharge: 2014-06-06 | Disposition: A | Discharge status: Home / Self Care | Payer: Medicaid Other | Source: Ambulatory Visit | Attending: Obstetrics | Admitting: Obstetrics

## 2014-06-14 ENCOUNTER — Ambulatory Visit (HOSPITAL_COMMUNITY)
Admission: RE | Admit: 2014-06-14 | Discharge: 2014-06-14 | Disposition: A | Discharge status: Home / Self Care | Payer: Medicaid Other | Source: Ambulatory Visit | Attending: Obstetrics | Admitting: Obstetrics

## 2014-06-14 DIAGNOSIS — D251 Intramural leiomyoma of uterus: Secondary | ICD-10-CM | POA: Diagnosis not present

## 2014-06-14 DIAGNOSIS — N949 Unspecified condition associated with female genital organs and menstrual cycle: Secondary | ICD-10-CM | POA: Insufficient documentation

## 2014-07-08 ENCOUNTER — Telehealth: Payer: Self-pay | Admitting: Internal Medicine

## 2014-07-08 NOTE — Telephone Encounter (Signed)
Ok to refill 

## 2014-07-08 NOTE — Telephone Encounter (Signed)
Refills sent. Dunlap Bing, CMA

## 2014-07-08 NOTE — Telephone Encounter (Signed)
Patient requesting refill on Ambien and Epi Pen.  Last OV 04/08/2014; next OV 10/08/14,  Ok to refill?

## 2014-07-16 ENCOUNTER — Other Ambulatory Visit: Payer: Self-pay | Admitting: Internal Medicine

## 2014-07-16 ENCOUNTER — Other Ambulatory Visit: Payer: Self-pay

## 2014-07-16 DIAGNOSIS — Z1231 Encounter for screening mammogram for malignant neoplasm of breast: Secondary | ICD-10-CM

## 2014-07-18 ENCOUNTER — Ambulatory Visit: Payer: Medicaid Other

## 2014-07-30 ENCOUNTER — Other Ambulatory Visit: Payer: Self-pay

## 2014-07-30 ENCOUNTER — Ambulatory Visit: Payer: Medicaid Other

## 2014-07-30 ENCOUNTER — Ambulatory Visit
Admission: RE | Admit: 2014-07-30 | Discharge: 2014-07-30 | Disposition: A | Discharge status: Home / Self Care | Payer: Medicaid Other | Source: Ambulatory Visit

## 2014-07-30 DIAGNOSIS — Z1231 Encounter for screening mammogram for malignant neoplasm of breast: Secondary | ICD-10-CM

## 2014-10-08 ENCOUNTER — Ambulatory Visit (INDEPENDENT_AMBULATORY_CARE_PROVIDER_SITE_OTHER)
Admission: RE | Admit: 2014-10-08 | Discharge: 2014-10-08 | Disposition: A | Discharge status: Home / Self Care | Payer: Medicaid Other | Source: Ambulatory Visit | Attending: Internal Medicine | Admitting: Internal Medicine

## 2014-10-08 ENCOUNTER — Ambulatory Visit (INDEPENDENT_AMBULATORY_CARE_PROVIDER_SITE_OTHER): Payer: Medicaid Other | Admitting: Internal Medicine

## 2014-10-08 ENCOUNTER — Encounter: Payer: Self-pay | Admitting: Internal Medicine

## 2014-10-08 VITALS — BP 142/80 | HR 69 | Ht 60.0 in | Wt 211.0 lb

## 2014-10-08 DIAGNOSIS — J45998 Other asthma: Secondary | ICD-10-CM

## 2014-10-08 DIAGNOSIS — R0602 Shortness of breath: Secondary | ICD-10-CM

## 2014-10-08 DIAGNOSIS — J301 Allergic rhinitis due to pollen: Secondary | ICD-10-CM

## 2014-10-08 DIAGNOSIS — J45909 Unspecified asthma, uncomplicated: Secondary | ICD-10-CM

## 2014-10-08 MED ORDER — MOMETASONE FURO-FORMOTEROL FUM 200-5 MCG/ACT IN AERO: INHALATION_SPRAY | RESPIRATORY_TRACT | Status: DC

## 2014-10-08 MED ORDER — IPRATROPIUM-ALBUTEROL 0.5-2.5 (3) MG/3ML IN SOLN: RESPIRATORY_TRACT | Status: DC

## 2014-10-08 MED ORDER — EPINEPHRINE 0.3 MG/0.3ML IJ SOAJ: INTRAMUSCULAR | Status: DC

## 2014-10-08 MED ORDER — ALBUTEROL SULFATE HFA 108 (90 BASE) MCG/ACT IN AERS: INHALATION_SPRAY | RESPIRATORY_TRACT | Status: DC

## 2014-10-08 NOTE — Assessment & Plan Note (Signed)
Obesity with associated hypoventilation and deconditioning are becoming significant components of her dyspnea with exertion. The asthma control seems quite good.

## 2014-10-08 NOTE — Assessment & Plan Note (Addendum)
We reviewed medications and sent refills. Office spirometry reviewed. Restrictive pattern reflects controlled asthma and abdominal obesity causing restriction. Plan-update chest x-ray in this former smoker with chronic lung disease.

## 2014-10-08 NOTE — Progress Notes (Signed)
Patient ID: Kelly Butler, female    DOB: 08-08-1960, 54 y.o.   MRN: [id number]  HPI 01/11/11- 17 yo F former smoker followed for Asthma, Allergic rhinitis, GERD, complicated by GERD, VCD, chronic headache.     Female companion here Last here June 10., 2011- note reviewed.  Today complains of persistent frontal headache for several days w/o numbness, syncope, vision change. She will address this with her family doctor.  "Sort of stopped up in head" in the mornings, not blowing out anything. At least once it got better for awhile after she came here for allergy shot. We restarted allergy shots - currently at 1:500 [name]. Dose adjusted prn by allergy lab for local reaction. She feels the shots help. Some wheeze. Not using her Advair- says no refill. . Uses rescue inhaler or her nebulizer. Says  clarinex worked much better than otc antihistamine.   02/05/11- 62 yo F former smoker followed for Asthma, Allergic rhinitis, GERD, complicated by GERD, VCD, chronic headache.     2 Female companions (sons?) here She says she is doing better , but that she stays in to avoid summer heat. Antibiotic Z pak  last time opened nose and permitted drainage, and reduced headache. Notes some HA and orthostatic light headedness have returned. Has resumed Advair and says prednisone helped as well. Notes only a little wheeze. Uses nebulizer twice daily and rescue inhaler 3-4 x/ day.  Allergy vaccine does help- Holding at 1:500.  Barefoot in grass made her feet itch badly. Does take claritin.    05/13/11- 50 yo F former smoker followed for Asthma, Allergic rhinitis, GERD, complicated by GERD, VCD, chronic headache.   Has had flu vaccine. Complains of chronic and sustained substernal chest pains, shortness of breath, headaches and fibromyalgia. Finished a prednisone taper yesterday. Occasional left arm aching, numbness and tingling. The chest pain is described as sharp, steady and lasting 5 or 6 minutes at a time. It is unrelated to  exertion or position but she definitely feels reflux. Asthma comes and goes. Nebulizer machine helps the most. At night she will toss and turn, can't sleep, uncomfortable. She continues allergy vaccine.  04/18/12- 32 yo F former smoker followed for Asthma, Allergic rhinitis, GERD, complicated by GERD, VCD, chronic headache. Has had flu shot ER visit this summer for "swelling". Was told there that she seemed to have sleep apnea. Son here confirms that she snores loudly and stops breathing. She asks about sleep study.  Had more asthma this summer blamed on wet weather but better now. Complains of frontal and maxillary sinus pressure.  She drop-off of allergy shots because of local reactions although they did help.  05/30/12- 63 yo F former smoker followed for Asthma, Allergic rhinitis, GERD, complicated by GERD, VCD, chronic headache. Has had flu shot FOLLOWS FOR: discuss sleep study with patient and options to restart allergy vaccine Recent cold as part more frequent wheeze. Clear secretions are not purulent and she denies fever or sore throat. NPSG 05/12/12- AHI 5.8/ hr, weight 209 lbs. minimal obstructive sleep apnea. Not enough for CPAP; weight loss would be more appropriate. We reviewed her sleep study. Her primary complaint is insomnia with difficulty initiating and maintaining sleep. We discussed sleep hygiene and use of medication. She wants to try Ambien  09/04/12- 50 yo F former smoker followed for Asthma, Allergic rhinitis, GERD, complicated by GERD, VCD, chronic headache. Has had flu shot FOLLOWS FWY:OVZC to see about restarting allergy shots or not. [place] Pax and  feels like it has helped better than any other inhalers in the past. We talked about allergy vaccine. She had trouble sticking with it in the past. If she can manage the cost of Breo and it provides control , we agreed to give that a good try first.  11/24/12- 52 yo F former smoker followed for Asthma, Allergic rhinitis, GERD,  complicated by GERD, VCD, glaucoma, chronic headache, insomnia    Here w/ son FOLLOWS FOR: SOB and wheezing x 1 month or so; just making do with Breo inhaler-cant tell its helping. Blames pollen, not a cold. Breo little help. Neb helps with double albuterol. CT chest 08/23/12 IMPRESSION:  1. No CT findings for pulmonary embolism.  2. Mild cardiac enlargement.  3. Normal thoracic aorta.  4. Mosaic attenuation pattern in the lungs along with areas of  atelectasis as discussed above. No focal infiltrate or effusion.  5. Marked fatty infiltration of the liver.  Original Report Authenticated By: Marijo Sanes, M.D.  07/12/13- 72 yo F former smoker followed for Asthma, Allergic rhinitis, GERD, complicated by GERD, VCD, glaucoma, chronic headache, insomnia    Here w/ son FOLLOWS FOR: Breathing is unchanged. Had an asthma attack several days ago. Reports waking up choking at night.  chronic orthopnea-extra pillows. Aware of reflux at night. Increase wheezing with a cold recently and still feels tighter than usual. Out of medications. Did not like Breo.  04/08/14-  52 yo F former smoker followed for Asthma, Allergic rhinitis, GERD, complicated by GERD, VCD, glaucoma, chronic headache, insomnia    Here w/ son FOLLOW UP: asthma, SOB today, took breathing treatment today, having some chest pain/tightness Vertex and occipital headache each morning. Denies nasal/sinus congestion or pressure. Daily wheeze. Note Enalapril/ ACEI  10/08/14- 27 yo F former smoker followed for Asthma, Allergic rhinitis, GERD, complicated by GERD, VCD, glaucoma, chronic headache, insomnia    Here w/ son FOLLOWS FOR: Pt states breathing is the same. Increased SOB with activitiy, wheezing, and chest tightness at times. Denies any chest congestion or cough. She walks "some" but not very regularly. Gradually aware of increasing dyspnea with routine exertion over the past year. No sudden events or acute infections. Occasional sharp sticking  pains across her upper abdomen but no angina. Little sleep disturbance. Occasional use of rescue inhaler. Office Spirometry 10/08/14- moderate restriction. FVC 1. for 3/60%, FEV1 1.22/61%, FEV1/FVC 0.85, FEF 25-75 percent 2.05/76%.  Review of Systems-see HPI Constitutional:      +weight gain, no-night sweats, fevers, chills, fatigue, lassitude. HEENT:   +  headaches,  No-difficulty swallowing, tooth/dental problems, sore throat,       No-  sneezing, itching, ear ache, +nasal congestion, post nasal drip,  CV:  No- chest pain,  No-orthopnea, PND, swelling in lower extremities, anasarca,  dizziness, palpitations Resp: + shortness of breath with exertion or at rest.              No-   productive cough,  + non-productive cough,  No- coughing up of blood.              No-   change in color of mucus.  + wheezing.   Skin: No-   rash or lesions. GI: +   heartburn, indigestion, no-abdominal pain, nausea, vomiting,  GU:  MS:  +  joint pain or swelling.  Neuro-     nothing unusual Psych:  No- change in mood or affect. No depression or anxiety.  No memory loss.  Objective:   Physical Exam  General- Alert, Oriented, Affect-appropriate, Distress- none acute,  +overweight Skin- rash-none, lesions- none, excoriation- none Lymphadenopathy- none Head- atraumatic            Eyes- Gross vision intact, PERRLA, conjunctivae clear secretions            Ears- Hearing, canals normal            Nose- Clear, No-Septal dev, mucus, polyps, erosion, perforation             Throat- Mallampati III-IV ,  drainage- none, tonsils- atrophic.  Neck- flexible , trachea midline, no stridor , thyroid nl, carotid no bruit Chest - symmetrical excursion , unlabored           Heart/CV- RRR , 1/6 SEM murmur , no gallop  , no rub, nl s1 s2                           - JVD- none , edema- none, stasis changes- none, varices- none           Lung- +distant, wheeze- none, cough- slight , dullness-none, rub- none           Chest wall-   Abd-  Br/ Gen/ Rectal- Not done, not indicated Extrem- cyanosis- none, clubbing, none, atrophy- none, strength- nl Neuro- grossly intact to observation

## 2014-10-08 NOTE — Assessment & Plan Note (Signed)
Minor nasal congestion. She will use antihistamine if needed.

## 2014-10-08 NOTE — Patient Instructions (Signed)
Refill scripts sent  Order- office spirometry    Dx asthma moderate  Order- CXR- dx asthma, moderate, former smoker

## 2014-10-10 NOTE — Progress Notes (Signed)
Quick Note:  Called and spoke to pt. Informed pt of the results per CY. Pt verbalized understanding and denied any further questions or concerns at this time. ______ 

## 2014-10-28 ENCOUNTER — Telehealth: Payer: Self-pay | Admitting: Internal Medicine

## 2014-10-28 NOTE — Telephone Encounter (Signed)
error 

## 2015-02-03 ENCOUNTER — Other Ambulatory Visit: Payer: Self-pay | Admitting: Internal Medicine

## 2015-02-03 NOTE — Telephone Encounter (Signed)
CY;please advise if okay to refill. Pt has pending OV 04-09-15. Thanks.

## 2015-02-03 NOTE — Telephone Encounter (Signed)
Ok to refill for 6 months 

## 2015-03-21 ENCOUNTER — Encounter: Payer: Self-pay | Admitting: Gastroenterology

## 2015-04-09 ENCOUNTER — Encounter: Payer: Self-pay | Admitting: Internal Medicine

## 2015-04-09 ENCOUNTER — Other Ambulatory Visit (INDEPENDENT_AMBULATORY_CARE_PROVIDER_SITE_OTHER): Payer: Medicaid Other

## 2015-04-09 ENCOUNTER — Ambulatory Visit (INDEPENDENT_AMBULATORY_CARE_PROVIDER_SITE_OTHER): Payer: Medicaid Other | Admitting: Internal Medicine

## 2015-04-09 VITALS — BP 132/80 | HR 77 | Ht 61.0 in | Wt 217.4 lb

## 2015-04-09 DIAGNOSIS — E662 Morbid (severe) obesity with alveolar hypoventilation: Secondary | ICD-10-CM

## 2015-04-09 DIAGNOSIS — G4733 Obstructive sleep apnea (adult) (pediatric): Secondary | ICD-10-CM

## 2015-04-09 DIAGNOSIS — J4541 Moderate persistent asthma with (acute) exacerbation: Secondary | ICD-10-CM

## 2015-04-09 DIAGNOSIS — Z23 Encounter for immunization: Secondary | ICD-10-CM | POA: Diagnosis not present

## 2015-04-09 DIAGNOSIS — J45998 Other asthma: Secondary | ICD-10-CM

## 2015-04-09 LAB — CBC WITH DIFFERENTIAL/PLATELET
BASOS ABS: 0 10*3/uL (ref 0.0–0.1)
Basophils Relative: 0.3 % (ref 0.0–3.0)
Eosinophils Absolute: 0.2 10*3/uL (ref 0.0–0.7)
Eosinophils Relative: 1.5 % (ref 0.0–5.0)
HCT: 36.5 % (ref 36.0–46.0)
Hemoglobin: 11.9 g/dL — ABNORMAL LOW (ref 12.0–15.0)
LYMPHS ABS: 2.5 10*3/uL (ref 0.7–4.0)
LYMPHS PCT: 20.6 % (ref 12.0–46.0)
MCHC: 32.7 g/dL (ref 30.0–36.0)
MCV: 78.8 fl (ref 78.0–100.0)
MONOS PCT: 3.5 % (ref 3.0–12.0)
Monocytes Absolute: 0.4 10*3/uL (ref 0.1–1.0)
NEUTROS ABS: 8.9 10*3/uL — AB (ref 1.4–7.7)
NEUTROS PCT: 74.1 % (ref 43.0–77.0)
PLATELETS: 385 10*3/uL (ref 150.0–400.0)
RBC: 4.64 Mil/uL (ref 3.87–5.11)
RDW: 14.7 % (ref 11.5–15.5)
WBC: 12.1 10*3/uL — ABNORMAL HIGH (ref 4.0–10.5)

## 2015-04-09 MED ORDER — UMECLIDINIUM-VILANTEROL 62.5-25 MCG/INH IN AEPB: 1.0000 | INHALATION_SPRAY | Freq: Every day | RESPIRATORY_TRACT | Status: DC

## 2015-04-09 NOTE — Progress Notes (Signed)
Patient ID: ARLISA LECLERE, female    DOB: 08-08-1960, 54 y.o.   MRN: [id number]  HPI 01/11/11- 54 yo F former smoker followed for Asthma, Allergic rhinitis, GERD, complicated by GERD, VCD, chronic headache.     Female companion here Last here June 10., 2011- note reviewed.  Today complains of persistent frontal headache for several days w/o numbness, syncope, vision change. She will address this with her family doctor.  "Sort of stopped up in head" in the mornings, not blowing out anything. At least once it got better for awhile after she came here for allergy shot. We restarted allergy shots - currently at 1:500 [name]. Dose adjusted prn by allergy lab for local reaction. She feels the shots help. Some wheeze. Not using her Advair- says no refill. . Uses rescue inhaler or her nebulizer. Says  clarinex worked much better than otc antihistamine.   02/05/11- 54 yo F former smoker followed for Asthma, Allergic rhinitis, GERD, complicated by GERD, VCD, chronic headache.     2 Female companions (sons?) here She says she is doing better , but that she stays in to avoid summer heat. Antibiotic Z pak  last time opened nose and permitted drainage, and reduced headache. Notes some HA and orthostatic light headedness have returned. Has resumed Advair and says prednisone helped as well. Notes only a little wheeze. Uses nebulizer twice daily and rescue inhaler 3-4 x/ day.  Allergy vaccine does help- Holding at 1:500.  Barefoot in grass made her feet itch badly. Does take claritin.    05/13/11- 54 yo F former smoker followed for Asthma, Allergic rhinitis, GERD, complicated by GERD, VCD, chronic headache.   Has had flu vaccine. Complains of chronic and sustained substernal chest pains, shortness of breath, headaches and fibromyalgia. Finished a prednisone taper yesterday. Occasional left arm aching, numbness and tingling. The chest pain is described as sharp, steady and lasting 5 or 6 minutes at a time. It is unrelated to  exertion or position but she definitely feels reflux. Asthma comes and goes. Nebulizer machine helps the most. At night she will toss and turn, can't sleep, uncomfortable. She continues allergy vaccine.  04/18/12- 54 yo F former smoker followed for Asthma, Allergic rhinitis, GERD, complicated by GERD, VCD, chronic headache. Has had flu shot ER visit this summer for "swelling". Was told there that she seemed to have sleep apnea. Son here confirms that she snores loudly and stops breathing. She asks about sleep study.  Had more asthma this summer blamed on wet weather but better now. Complains of frontal and maxillary sinus pressure.  She drop-off of allergy shots because of local reactions although they did help.  05/30/12- 54 yo F former smoker followed for Asthma, Allergic rhinitis, GERD, complicated by GERD, VCD, chronic headache. Has had flu shot FOLLOWS FOR: discuss sleep study with patient and options to restart allergy vaccine Recent cold as part more frequent wheeze. Clear secretions are not purulent and she denies fever or sore throat. NPSG 05/12/12- AHI 5.8/ hr, weight 209 lbs. minimal obstructive sleep apnea. Not enough for CPAP; weight loss would be more appropriate. We reviewed her sleep study. Her primary complaint is insomnia with difficulty initiating and maintaining sleep. We discussed sleep hygiene and use of medication. She wants to try Ambien  09/04/12- 54 yo F former smoker followed for Asthma, Allergic rhinitis, GERD, complicated by GERD, VCD, chronic headache. Has had flu shot FOLLOWS FWY:OVZC to see about restarting allergy shots or not. [place] Pax and  feels like it has helped better than any other inhalers in the past. We talked about allergy vaccine. She had trouble sticking with it in the past. If she can manage the cost of Breo and it provides control , we agreed to give that a good try first.  11/24/12- 54 yo F former smoker followed for Asthma, Allergic rhinitis, GERD,  complicated by GERD, VCD, glaucoma, chronic headache, insomnia    Here w/ son FOLLOWS FOR: SOB and wheezing x 1 month or so; just making do with Breo inhaler-cant tell its helping. Blames pollen, not a cold. Breo little help. Neb helps with double albuterol. CT chest 08/23/12 IMPRESSION:  1. No CT findings for pulmonary embolism.  2. Mild cardiac enlargement.  3. Normal thoracic aorta.  4. Mosaic attenuation pattern in the lungs along with areas of  atelectasis as discussed above. No focal infiltrate or effusion.  5. Marked fatty infiltration of the liver.  Original Report Authenticated By: Marijo Sanes, M.D.  07/12/13- 54 yo F former smoker followed for Asthma, Allergic rhinitis, GERD, complicated by GERD, VCD, glaucoma, chronic headache, insomnia    Here w/ son FOLLOWS FOR: Breathing is unchanged. Had an asthma attack several days ago. Reports waking up choking at night.  chronic orthopnea-extra pillows. Aware of reflux at night. Increase wheezing with a cold recently and still feels tighter than usual. Out of medications. Did not like Breo.  04/08/14-  54 yo F former smoker followed for Asthma, Allergic rhinitis, GERD, complicated by GERD, VCD, glaucoma, chronic headache, insomnia    Here w/ son FOLLOW UP: asthma, SOB today, took breathing treatment today, having some chest pain/tightness Vertex and occipital headache each morning. Denies nasal/sinus congestion or pressure. Daily wheeze. Note Enalapril/ ACEI  10/08/14- 54 yo F former smoker followed for Asthma, Allergic rhinitis, GERD, complicated by GERD, VCD, glaucoma, chronic headache, insomnia    Here w/ son FOLLOWS FOR: Pt states breathing is the same. Increased SOB with activitiy, wheezing, and chest tightness at times. Denies any chest congestion or cough. She walks "some" but not very regularly. Gradually aware of increasing dyspnea with routine exertion over the past year. No sudden events or acute infections. Occasional sharp sticking  pains across her upper abdomen but no angina. Little sleep disturbance. Occasional use of rescue inhaler. Office Spirometry 10/08/14- moderate restriction. FVC 1. for 3/60%, FEV1 1.22/61%, FEV1/FVC 0.85, FEF 25-75 percent 2.05/76%.  04/09/15- 89 yo F former smoker followed for Asthma, Allergic rhinitis, GERD, complicated by GERD, VCD, glaucoma/ Timoptic, chronic headache, insomnia    FOLLOWS FOR: pt states she is doing ok. pt states when she walks or does activity she gets an increase in SOB.  pt uses nebulizer with relief . pt states she currently has a little chest tightness and wheezing and has had to use her inhailer more regularly.   pt states she has a concern with any activity  and when sexually active she gets a shortness of breath that makes her feel like she is having a heart attack and can not catch her breath. pt states she uses her nebulizer in those events.  Keeps head of bed elevated. Nebulizer machine works better than metered rescue inhaler. Chest x-ray images reviewed with her. CXR 10/08/14 IMPRESSION: Unchanged cardiomegaly. No acute cardiopulmonary disease. Electronically Signed  By: Dereck Ligas M.D.  On: 10/08/2014 12:34   Review of Systems-see HPI Constitutional:      +weight gain, no-night sweats, fevers, chills, fatigue, lassitude. HEENT:   +  headaches,  No-difficulty swallowing, tooth/dental problems, sore throat,       No-  sneezing, itching, ear ache, +nasal congestion, post nasal drip,  CV:  No- chest pain,  No-orthopnea, PND, swelling in lower extremities, anasarca,  dizziness, palpitations Resp: + shortness of breath with exertion or at rest.              No-   productive cough,  + non-productive cough,  No- coughing up of blood.              No-   change in color of mucus.  + wheezing.   Skin: No-   rash or lesions. GI: +   heartburn, indigestion, no-abdominal pain, nausea, vomiting,  GU:  MS:  +  joint pain or swelling.  Neuro-     nothing unusual Psych:   No- change in mood or affect. No depression or anxiety.  No memory loss.  Objective:   Physical Exam General- Alert, Oriented, Affect-appropriate, Distress- none acute,  +overweight Skin- rash-none, lesions- none, excoriation- none Lymphadenopathy- none Head- atraumatic            Eyes- Gross vision intact, PERRLA, conjunctivae clear secretions            Ears- Hearing, canals normal            Nose- Clear, No-Septal dev, mucus, polyps, erosion, perforation             Throat- Mallampati III-IV ,  drainage- none, tonsils- atrophic.  Neck- flexible , trachea midline, no stridor , thyroid nl, carotid no bruit Chest - symmetrical excursion , unlabored           Heart/CV- RRR , 1/6 SEM murmur , no gallop  , no rub, nl s1 s2                           - JVD- none , edema- none, stasis changes- none, varices- none           Lung- +distant, wheeze- none, cough- slight , dullness-none, rub- none           Chest wall-  Abd-  Br/ Gen/ Rectal- Not done, not indicated Extrem- cyanosis- none, clubbing, none, atrophy- none, strength- nl Neuro- grossly intact to observation

## 2015-04-09 NOTE — Patient Instructions (Signed)
Flu vax  Order- lab- CBC w diff, Total IgE      Dx moderate persistent asthma  Sample Anoro Ellipta    [place] 1 puff, once daily    Try this instead of Dulera, while the sample lasts. When you run out, go back to the [facility] for comparison.

## 2015-04-10 LAB — IGE: IgE (Immunoglobulin E), Serum: 183 kU/L — ABNORMAL HIGH (ref <115)

## 2015-04-16 DIAGNOSIS — E662 Morbid (severe) obesity with alveolar hypoventilation: Secondary | ICD-10-CM | POA: Insufficient documentation

## 2015-04-16 NOTE — Assessment & Plan Note (Signed)
On examination, body habitus consistent with OHS/OSA and weight loss is strongly advised

## 2015-04-16 NOTE — Assessment & Plan Note (Signed)
Continue to advise weight loss

## 2015-04-16 NOTE — Assessment & Plan Note (Addendum)
Asthma, moderate persistent.. We also discussed potential interaction of Timoptic with her asthma. Plan-IgE and eosinophil count to see if she would be a candidate for either Xolair or Nucala therapy. For trial, see if Anoro Ellipta works any better than [employer]. Technique emphasized.

## 2015-05-02 ENCOUNTER — Telehealth: Payer: Self-pay | Admitting: Internal Medicine

## 2015-05-02 MED ORDER — UMECLIDINIUM-VILANTEROL 62.5-25 MCG/INH IN AEPB: 1.0000 | INHALATION_SPRAY | Freq: Every day | RESPIRATORY_TRACT | Status: DC

## 2015-05-02 NOTE — Telephone Encounter (Signed)
Spoke with pt, states that the Anoro sample that was given worked better for pt than the [facility] she was on before and is requesting this be sent to her pharmacy.  [facility] [address].  This has been sent. Pt also wants to know if she is ok to start xolair injections as discussed at last ov.   Katie/CY please advise on Xolair.  Thanks!

## 2015-05-02 NOTE — Telephone Encounter (Signed)
Kelly Butler- her IgE should qualify her for [employer]. Can we start an applicatrion for her please.

## 2015-05-06 NOTE — Telephone Encounter (Signed)
Spoke with patient-she is aware of IgE levels and will be able to come by the office on Friday 05-23-15 around 10:30am to discuss Xolair, get packet, and complete forms. Pt aware if she is able to come by any sooner to contact me and we will arrange then.

## 2015-05-08 ENCOUNTER — Telehealth: Payer: Self-pay | Admitting: Internal Medicine

## 2015-05-08 NOTE — Telephone Encounter (Signed)
Initiated PA for [employer] with  [facility] Pt ID: [id number] o Ref #: [id number] Sent for review. Will await response.

## 2015-05-09 NOTE — Telephone Encounter (Signed)
PA can take up to 72 hrs for a response. Will await decision

## 2015-05-14 MED ORDER — TIOTROPIUM BROMIDE MONOHYDRATE 18 MCG IN CAPS: 18.0000 ug | ORAL_CAPSULE | Freq: Every day | RESPIRATORY_TRACT | Status: DC

## 2015-05-14 NOTE — Telephone Encounter (Signed)
Called [place] [facility] [phone]) and spoke to [place]. PA for Anoro has been denied, pt will need to try and fail - spiriva handihaler.   Dr. [name] please advise. Thanks.

## 2015-05-14 NOTE — Telephone Encounter (Signed)
Kelly Butler, [phone] Patient is calling regarding new inhaler.  States Medicaid does not cover and wants to know if something else can be prescribed.

## 2015-05-14 NOTE — Telephone Encounter (Signed)
Ok to [employer].   Rx Spiriva handihaler, # 30, use once daily, refill prn

## 2015-05-14 NOTE — Telephone Encounter (Signed)
Called and spoke with pt. Informed her of CY's recs. [facility] [address]. She agreed to trying Spiriva handihaler. Rx sent to the pharmacy. Nothing further needed. Will sign off on message.

## 2015-05-14 NOTE — Telephone Encounter (Signed)
Spoke with the pt  I advised that we tried PA for Anoro and it was denied  She is aware that we are waiting on response from [facility] on alternative  Ins prefers spiriva  Please advise thanks!

## 2015-05-16 ENCOUNTER — Other Ambulatory Visit: Payer: Self-pay | Admitting: Internal Medicine

## 2015-05-16 NOTE — Telephone Encounter (Signed)
Received paper refill request for pt's EpiPen Refill sent electronically to pt's pharmacy   Nothing further is needed

## 2015-05-19 ENCOUNTER — Other Ambulatory Visit: Payer: Self-pay | Admitting: Internal Medicine

## 2015-05-20 ENCOUNTER — Telehealth: Payer: Self-pay | Admitting: Internal Medicine

## 2015-05-20 NOTE — Telephone Encounter (Signed)
Pt was calling to let me know that Friday 05/23/15 will not work best fo rher to come by and sign Xolair paperwork-pt will be able to come by on Monday 05/26/15 at 11:30am. Pt will ask for Tobenna Needs. Nothing more needed at this time.

## 2015-05-26 ENCOUNTER — Telehealth: Payer: Self-pay | Admitting: Internal Medicine

## 2015-05-26 NOTE — Telephone Encounter (Signed)
Spoke with patient-states she is unable to come today to sign Xolair forms-pt states she is able to come by on Wednesday 05/28/15 at 11:30am. I have made note of this on forms and will go over forms, Xolair, and Epipen usage at that time.

## 2015-05-26 NOTE — Telephone Encounter (Signed)
Per Joellen Jersey, she will call pt. Sending to her inbox

## 2015-06-10 ENCOUNTER — Telehealth: Payer: Self-pay | Admitting: Internal Medicine

## 2015-06-10 NOTE — Telephone Encounter (Signed)
Forms have be re-faxed to [facility] with corrections as requested. Nothing more needed.

## 2015-06-16 ENCOUNTER — Other Ambulatory Visit: Payer: Self-pay | Admitting: Internal Medicine

## 2015-07-30 ENCOUNTER — Telehealth: Payer: Self-pay | Admitting: Internal Medicine

## 2015-07-30 NOTE — Telephone Encounter (Addendum)
#   vials:4 Ordered date:07/28/15 Forgot to put in epic. Shipping Date:07/29/15

## 2015-07-30 NOTE — Telephone Encounter (Signed)
#   Vials:4 Arrival Date:07/30/15 Lot [id number] Exp Date:6/20

## 2015-08-05 ENCOUNTER — Telehealth: Payer: Self-pay | Admitting: Internal Medicine

## 2015-08-05 NOTE — Telephone Encounter (Signed)
LMTCB on Thursday 08-07-15; will need to review Xolair protocol with patient and set up appt date/time.

## 2015-08-11 ENCOUNTER — Ambulatory Visit: Payer: Medicaid Other | Admitting: Internal Medicine

## 2015-08-15 NOTE — Telephone Encounter (Signed)
lmtcb x2 for pt. 

## 2015-08-21 NOTE — Telephone Encounter (Signed)
LMTCB

## 2015-09-03 ENCOUNTER — Encounter: Payer: Self-pay | Admitting: *Deleted

## 2015-09-03 NOTE — Telephone Encounter (Signed)
After several attempts to reach patient without success I have sent a letter to patient requesting she contact the office to go over [place] set up process as he medication is in the office.

## 2015-09-08 ENCOUNTER — Other Ambulatory Visit: Payer: Self-pay

## 2015-09-08 DIAGNOSIS — Z1231 Encounter for screening mammogram for malignant neoplasm of breast: Secondary | ICD-10-CM

## 2015-10-01 ENCOUNTER — Other Ambulatory Visit: Payer: Self-pay | Admitting: Internal Medicine

## 2015-10-01 NOTE — Telephone Encounter (Signed)
CY Please advise on refill. Thanks.  

## 2015-10-02 NOTE — Telephone Encounter (Signed)
Ok to refill 

## 2015-10-06 ENCOUNTER — Telehealth: Payer: Self-pay | Admitting: Internal Medicine

## 2015-10-06 ENCOUNTER — Ambulatory Visit
Admission: RE | Admit: 2015-10-06 | Discharge: 2015-10-06 | Disposition: A | Discharge status: Home / Self Care | Payer: Medicaid Other | Source: Ambulatory Visit

## 2015-10-06 DIAGNOSIS — Z1231 Encounter for screening mammogram for malignant neoplasm of breast: Secondary | ICD-10-CM

## 2015-10-06 NOTE — Telephone Encounter (Signed)
atc pt X2, no answer, no vm.  Wcb.  

## 2015-10-07 NOTE — Telephone Encounter (Signed)
ATC patient, phone # temporarily  disconnected. wcb

## 2015-10-08 NOTE — Telephone Encounter (Signed)
ATC pt, line has been d/c'ed  Called daughter [facility]) and got a fast busy tone  I am closing encounter and will await for her to call back

## 2015-10-22 ENCOUNTER — Telehealth: Payer: Self-pay | Admitting: Internal Medicine

## 2015-10-22 MED ORDER — EPINEPHRINE 0.3 MG/0.3ML IJ SOAJ: INTRAMUSCULAR | Status: DC

## 2015-10-22 NOTE — Telephone Encounter (Signed)
Please see previous phone note created on patient about starting Xolair. Pt has been set up for Monday 10-27-15 at 10:15am to start her first Xolair injections. Epipen has been sent to confirmed pharmacy on file in [place]. Pt aware of 2 hour wait time after injections and to bring her Epipen with her. Nothing more needed at this time.

## 2015-10-22 NOTE — Telephone Encounter (Signed)
Spoke with pt, states she is returning a call to [place].  I see no documentation of this.  Katie please advise.  Thanks!

## 2015-10-27 ENCOUNTER — Ambulatory Visit: Payer: Medicaid Other

## 2015-10-29 ENCOUNTER — Ambulatory Visit (INDEPENDENT_AMBULATORY_CARE_PROVIDER_SITE_OTHER): Payer: Medicaid Other

## 2015-10-29 DIAGNOSIS — J4540 Moderate persistent asthma, uncomplicated: Secondary | ICD-10-CM | POA: Diagnosis not present

## 2015-10-30 MED ORDER — OMALIZUMAB 150 MG ~~LOC~~ SOLR
225.0000 mg | Freq: Once | SUBCUTANEOUS | Status: AC
  Administered 2015-10-29: 225 mg via SUBCUTANEOUS

## 2015-11-07 ENCOUNTER — Other Ambulatory Visit: Payer: Self-pay | Admitting: Internal Medicine

## 2015-11-11 ENCOUNTER — Ambulatory Visit (INDEPENDENT_AMBULATORY_CARE_PROVIDER_SITE_OTHER): Payer: Medicaid Other | Admitting: Internal Medicine

## 2015-11-11 ENCOUNTER — Ambulatory Visit: Payer: Medicaid Other

## 2015-11-11 ENCOUNTER — Encounter: Payer: Self-pay | Admitting: Internal Medicine

## 2015-11-11 VITALS — BP 124/80 | HR 100 | Ht 61.0 in | Wt 217.0 lb

## 2015-11-11 DIAGNOSIS — J4551 Severe persistent asthma with (acute) exacerbation: Secondary | ICD-10-CM

## 2015-11-11 DIAGNOSIS — E662 Morbid (severe) obesity with alveolar hypoventilation: Secondary | ICD-10-CM | POA: Diagnosis not present

## 2015-11-11 DIAGNOSIS — J45998 Other asthma: Secondary | ICD-10-CM | POA: Diagnosis not present

## 2015-11-11 DIAGNOSIS — J4540 Moderate persistent asthma, uncomplicated: Secondary | ICD-10-CM

## 2015-11-11 MED ORDER — ZOLPIDEM TARTRATE 10 MG PO TABS: 10.0000 mg | ORAL_TABLET | Freq: Every day | ORAL | Status: DC

## 2015-11-11 MED ORDER — LEVALBUTEROL HCL 0.63 MG/3ML IN NEBU
0.6300 mg | INHALATION_SOLUTION | Freq: Once | RESPIRATORY_TRACT | Status: AC
  Administered 2015-11-11: 0.63 mg via RESPIRATORY_TRACT

## 2015-11-11 MED ORDER — OMALIZUMAB 150 MG ~~LOC~~ SOLR
225.0000 mg | Freq: Once | SUBCUTANEOUS | Status: AC
  Administered 2015-11-11: 225 mg via SUBCUTANEOUS

## 2015-11-11 MED ORDER — METHYLPREDNISOLONE ACETATE 80 MG/ML IJ SUSP
80.0000 mg | Freq: Once | INTRAMUSCULAR | Status: AC
  Administered 2015-11-11: 80 mg via INTRAMUSCULAR

## 2015-11-11 MED ORDER — UMECLIDINIUM-VILANTEROL 62.5-25 MCG/INH IN AEPB: 1.0000 | INHALATION_SPRAY | Freq: Every day | RESPIRATORY_TRACT | Status: DC

## 2015-11-11 NOTE — Patient Instructions (Signed)
Neb xop 0.63   Dx exacerbation asthma  Depo 64  Order CXR    Dx asthma severe persistent  Sample Anoro Ellipta maintenance inhaler     Inhale 1 puff, once daily     Use this up and notice if it helps your breathing

## 2015-11-11 NOTE — Progress Notes (Signed)
Patient ID: Kelly Butler, female    DOB: 08-08-1960, 55 y.o.   MRN: [id number]  HPI 01/11/11- 17 yo F former smoker followed for Asthma, Allergic rhinitis, GERD, complicated by GERD, VCD, chronic headache.     Female companion here Last here June 10., 2011- note reviewed.  Today complains of persistent frontal headache for several days w/o numbness, syncope, vision change. She will address this with her family doctor.  "Sort of stopped up in head" in the mornings, not blowing out anything. At least once it got better for awhile after she came here for allergy shot. We restarted allergy shots - currently at 1:500 [name]. Dose adjusted prn by allergy lab for local reaction. She feels the shots help. Some wheeze. Not using her Advair- says no refill. . Uses rescue inhaler or her nebulizer. Says  clarinex worked much better than otc antihistamine.   02/05/11- 62 yo F former smoker followed for Asthma, Allergic rhinitis, GERD, complicated by GERD, VCD, chronic headache.     2 Female companions (sons?) here She says she is doing better , but that she stays in to avoid summer heat. Antibiotic Z pak  last time opened nose and permitted drainage, and reduced headache. Notes some HA and orthostatic light headedness have returned. Has resumed Advair and says prednisone helped as well. Notes only a little wheeze. Uses nebulizer twice daily and rescue inhaler 3-4 x/ day.  Allergy vaccine does help- Holding at 1:500.  Barefoot in grass made her feet itch badly. Does take claritin.    05/13/11- 50 yo F former smoker followed for Asthma, Allergic rhinitis, GERD, complicated by GERD, VCD, chronic headache.   Has had flu vaccine. Complains of chronic and sustained substernal chest pains, shortness of breath, headaches and fibromyalgia. Finished a prednisone taper yesterday. Occasional left arm aching, numbness and tingling. The chest pain is described as sharp, steady and lasting 5 or 6 minutes at a time. It is unrelated to  exertion or position but she definitely feels reflux. Asthma comes and goes. Nebulizer machine helps the most. At night she will toss and turn, can't sleep, uncomfortable. She continues allergy vaccine.  04/18/12- 32 yo F former smoker followed for Asthma, Allergic rhinitis, GERD, complicated by GERD, VCD, chronic headache. Has had flu shot ER visit this summer for "swelling". Was told there that she seemed to have sleep apnea. Son here confirms that she snores loudly and stops breathing. She asks about sleep study.  Had more asthma this summer blamed on wet weather but better now. Complains of frontal and maxillary sinus pressure.  She drop-off of allergy shots because of local reactions although they did help.  05/30/12- 63 yo F former smoker followed for Asthma, Allergic rhinitis, GERD, complicated by GERD, VCD, chronic headache. Has had flu shot FOLLOWS FOR: discuss sleep study with patient and options to restart allergy vaccine Recent cold as part more frequent wheeze. Clear secretions are not purulent and she denies fever or sore throat. NPSG 05/12/12- AHI 5.8/ hr, weight 209 lbs. minimal obstructive sleep apnea. Not enough for CPAP; weight loss would be more appropriate. We reviewed her sleep study. Her primary complaint is insomnia with difficulty initiating and maintaining sleep. We discussed sleep hygiene and use of medication. She wants to try Ambien  09/04/12- 50 yo F former smoker followed for Asthma, Allergic rhinitis, GERD, complicated by GERD, VCD, chronic headache. Has had flu shot FOLLOWS FWY:OVZC to see about restarting allergy shots or not. [place] Pax and  feels like it has helped better than any other inhalers in the past. We talked about allergy vaccine. She had trouble sticking with it in the past. If she can manage the cost of Breo and it provides control , we agreed to give that a good try first.  11/24/12- 82 yo F former smoker followed for Asthma, Allergic rhinitis, GERD,  complicated by GERD, VCD, glaucoma, chronic headache, insomnia    Here w/ son FOLLOWS FOR: SOB and wheezing x 1 month or so; just making do with Breo inhaler-cant tell its helping. Blames pollen, not a cold. Breo little help. Neb helps with double albuterol. CT chest 08/23/12 IMPRESSION:  1. No CT findings for pulmonary embolism.  2. Mild cardiac enlargement.  3. Normal thoracic aorta.  4. Mosaic attenuation pattern in the lungs along with areas of  atelectasis as discussed above. No focal infiltrate or effusion.  5. Marked fatty infiltration of the liver.  Original Report Authenticated By: Marijo Sanes, M.D.  07/12/13- 52 yo F former smoker followed for Asthma, Allergic rhinitis, GERD, complicated by GERD, VCD, glaucoma, chronic headache, insomnia    Here w/ son FOLLOWS FOR: Breathing is unchanged. Had an asthma attack several days ago. Reports waking up choking at night.  chronic orthopnea-extra pillows. Aware of reflux at night. Increase wheezing with a cold recently and still feels tighter than usual. Out of medications. Did not like Breo.  04/08/14-  84 yo F former smoker followed for Asthma, Allergic rhinitis, GERD, complicated by GERD, VCD, glaucoma, chronic headache, insomnia    Here w/ son FOLLOW UP: asthma, SOB today, took breathing treatment today, having some chest pain/tightness Vertex and occipital headache each morning. Denies nasal/sinus congestion or pressure. Daily wheeze. Note Enalapril/ ACEI  10/08/14- 75 yo F former smoker followed for Asthma, Allergic rhinitis, GERD, complicated by GERD, VCD, glaucoma, chronic headache, insomnia    Here w/ son FOLLOWS FOR: Pt states breathing is the same. Increased SOB with activitiy, wheezing, and chest tightness at times. Denies any chest congestion or cough. She walks "some" but not very regularly. Gradually aware of increasing dyspnea with routine exertion over the past year. No sudden events or acute infections. Occasional sharp sticking  pains across her upper abdomen but no angina. Little sleep disturbance. Occasional use of rescue inhaler. Office Spirometry 10/08/14- moderate restriction. FVC 1. for 3/60%, FEV1 1.22/61%, FEV1/FVC 0.85, FEF 25-75 percent 2.05/76%.  04/09/15- 47 yo F former smoker followed for Asthma, Allergic rhinitis, GERD, complicated by GERD, VCD, glaucoma/ Timoptic, chronic headache, insomnia    FOLLOWS FOR: pt states she is doing ok. pt states when she walks or does activity she gets an increase in SOB.  pt uses nebulizer with relief . pt states she currently has a little chest tightness and wheezing and has had to use her inhailer more regularly.   pt states she has a concern with any activity  and when sexually active she gets a shortness of breath that makes her feel like she is having a heart attack and can not catch her breath. pt states she uses her nebulizer in those events.  Keeps head of bed elevated. Nebulizer machine works better than metered rescue inhaler. Chest x-ray images reviewed with her. CXR 10/08/14 IMPRESSION: Unchanged cardiomegaly. No acute cardiopulmonary disease. Electronically Signed  By: Dereck Ligas M.D.  On: 10/08/2014 12:34  11/11/2015-55 year old female former smoker followed for Asthma, Allergic rhinitis, complicated by GERD, VCD, glaucoma/Timoptic, chronic headaches, insomnia Xolair started 10/29/2015 FOLLOWS FOR: Pt states she  has been using her nebulizer for about 1 month now due to wheezing and SOB. Pt started Xolair on 10-29-15 and no reactions. Cough with scant thick yellow mucus and some chest tightness. Nebulizer helps but she is using it more often. Rescue albuterol inhaler not as much help. Easier dyspnea on exertion.  Review of Systems-see HPI Constitutional:      +weight gain, no-night sweats, fevers, chills, fatigue, lassitude. HEENT:   +  headaches,  No-difficulty swallowing, tooth/dental problems, sore throat,       No-  sneezing, itching, ear ache, +nasal  congestion, post nasal drip,  CV:  No- chest pain,  No-orthopnea, PND, swelling in lower extremities, anasarca,  dizziness, palpitations Resp: + shortness of breath with exertion or at rest.          + productive cough,  + non-productive cough,  No- coughing up of blood.              No-   change in color of mucus.  + wheezing.   Skin: No-   rash or lesions. GI: +   heartburn, indigestion, no-abdominal pain, nausea, vomiting,  GU:  MS:  +  joint pain or swelling.  Neuro-     nothing unusual Psych:  No- change in mood or affect. No depression or anxiety.  No memory loss.  Objective:   Physical Exam General- Alert, Oriented, Affect-appropriate, Distress- none acute,  +overweight Skin- rash-none, lesions- none, excoriation- none Lymphadenopathy- none Head- atraumatic            Eyes- Gross vision intact, PERRLA, conjunctivae clear secretions            Ears- Hearing, canals normal            Nose- Clear, No-Septal dev, mucus, polyps, erosion, perforation             Throat- Mallampati III-IV ,  drainage- none, tonsils- atrophic.  Neck- flexible , trachea midline, no stridor , thyroid nl, carotid no bruit Chest - symmetrical excursion , unlabored           Heart/CV- RRR , 1/6 SEM murmur , no gallop  , no rub, nl s1 s2                           - JVD- none , edema- none, stasis changes- none, varices- none           Lung- +distant, wheeze- none, cough- slight , dullness-none, rub- none           Chest wall-  Abd-  Br/ Gen/ Rectal- Not done, not indicated Extrem- cyanosis- none, clubbing, none, atrophy- none, strength- nl Neuro- grossly intact to observation

## 2015-11-12 ENCOUNTER — Ambulatory Visit: Payer: Medicaid Other

## 2015-11-12 NOTE — Assessment & Plan Note (Signed)
Weight is going in the wrong direction to be helpful with this problem

## 2015-11-12 NOTE — Assessment & Plan Note (Signed)
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

## 2015-11-19 ENCOUNTER — Telehealth: Payer: Self-pay | Admitting: Internal Medicine

## 2015-11-19 NOTE — Telephone Encounter (Signed)
#   vials:4 Ordered date:11/19/15 Shipping Date:11/20/15

## 2015-11-21 NOTE — Telephone Encounter (Signed)
#   Vials:4 Arrival Date:11/21/15 Lot [id number] Exp Date:12/20

## 2015-11-25 ENCOUNTER — Ambulatory Visit: Payer: Medicaid Other

## 2015-11-27 ENCOUNTER — Ambulatory Visit: Payer: Medicaid Other

## 2015-11-27 ENCOUNTER — Telehealth: Payer: Self-pay | Admitting: Internal Medicine

## 2015-11-27 NOTE — Telephone Encounter (Signed)
Attempted to contact pt. Line rings several times and I receive a message that this has been disconnected. Will try back.

## 2015-11-28 NOTE — Telephone Encounter (Signed)
Attempted to contact pt. Line rang busy. Will try back. 

## 2015-12-02 NOTE — Telephone Encounter (Signed)
Kelly Butler - has this patient been dismissed from our practice?  Please advise.

## 2015-12-02 NOTE — Telephone Encounter (Signed)
This patient is NOT dismissed from our practice-working with Kelly Butler to get that removed from patient's chart.

## 2015-12-02 NOTE — Telephone Encounter (Signed)
Nothing further needed per [facility]. Will close this message

## 2015-12-02 NOTE — Telephone Encounter (Signed)
Patient just called and rescheduled her appt to Friday.  Checking again to see if this patient has been dismissed?

## 2015-12-03 ENCOUNTER — Ambulatory Visit: Payer: Medicaid Other

## 2015-12-05 ENCOUNTER — Encounter: Payer: Self-pay | Admitting: Internal Medicine

## 2015-12-05 ENCOUNTER — Ambulatory Visit: Payer: Medicaid Other

## 2015-12-05 NOTE — Progress Notes (Signed)
12/05/2015-Reversal Of Dismissal From Practice. Patient had been dismissed from practice by Dr. Sabino Donovan, who has left this practice himself. I have been following the patient regularly since then with no problems. I do not consider her dismissed from the practice. -CD Young M.D.

## 2015-12-09 ENCOUNTER — Ambulatory Visit (INDEPENDENT_AMBULATORY_CARE_PROVIDER_SITE_OTHER): Payer: Medicaid Other

## 2015-12-09 DIAGNOSIS — J45998 Other asthma: Secondary | ICD-10-CM | POA: Diagnosis not present

## 2015-12-10 MED ORDER — OMALIZUMAB 150 MG ~~LOC~~ SOLR
225.0000 mg | Freq: Once | SUBCUTANEOUS | Status: AC
  Administered 2015-12-09: 225 mg via SUBCUTANEOUS

## 2015-12-17 ENCOUNTER — Telehealth: Payer: Self-pay | Admitting: Internal Medicine

## 2015-12-17 NOTE — Telephone Encounter (Signed)
atc pt X3, line rang to fast busy signal.  Wcb.  

## 2015-12-18 NOTE — Telephone Encounter (Signed)
Attemped to call the number we have listed. It has been disconnected.

## 2015-12-19 ENCOUNTER — Other Ambulatory Visit: Payer: Self-pay | Admitting: Internal Medicine

## 2015-12-22 MED ORDER — UMECLIDINIUM-VILANTEROL 62.5-25 MCG/INH IN AEPB: 1.0000 | INHALATION_SPRAY | Freq: Every day | RESPIRATORY_TRACT | Status: DC

## 2015-12-24 ENCOUNTER — Ambulatory Visit (INDEPENDENT_AMBULATORY_CARE_PROVIDER_SITE_OTHER): Payer: Medicaid Other

## 2015-12-24 DIAGNOSIS — J4540 Moderate persistent asthma, uncomplicated: Secondary | ICD-10-CM | POA: Diagnosis not present

## 2015-12-26 MED ORDER — OMALIZUMAB 150 MG ~~LOC~~ SOLR
225.0000 mg | Freq: Once | SUBCUTANEOUS | Status: AC
  Administered 2015-12-24: 225 mg via SUBCUTANEOUS

## 2015-12-29 ENCOUNTER — Telehealth: Payer: Self-pay | Admitting: Internal Medicine

## 2015-12-29 NOTE — Telephone Encounter (Signed)
#   vials:4 Ordered date:12/29/15 Shipping Date:12/31/15

## 2016-01-01 NOTE — Telephone Encounter (Signed)
#   Vials:4 Arrival Date:01/01/16 Lot [id number] Exp Date:12/20

## 2016-01-02 ENCOUNTER — Telehealth: Payer: Self-pay | Admitting: *Deleted

## 2016-01-02 NOTE — Telephone Encounter (Signed)
PA for anoro [phone] ID # [id number] Pt has medicaid and anoro is non preferred. On formulary spiriva is the preferred medication. i do not see this on patient past medication list. pleaase advise Dr. [name] thanks

## 2016-01-02 NOTE — Telephone Encounter (Signed)
ATC pt. No answer. Phone off at this time. Will not send medication until discussed with pt. Will call 01/05/16.

## 2016-01-02 NOTE — Telephone Encounter (Signed)
Please advise patient that insurance won't cover Anoro.  Offer script for Spiriva Handihaler    # 30, inhale once daily   Refill x 12

## 2016-01-05 ENCOUNTER — Emergency Department (HOSPITAL_COMMUNITY)
Admission: EM | Admit: 2016-01-05 | Discharge: 2016-01-05 | Disposition: A | Discharge status: Home / Self Care | Payer: Medicaid Other | Attending: Emergency Medicine | Admitting: Emergency Medicine

## 2016-01-05 ENCOUNTER — Encounter (HOSPITAL_COMMUNITY): Payer: Self-pay | Admitting: Emergency Medicine

## 2016-01-05 DIAGNOSIS — R350 Frequency of micturition: Secondary | ICD-10-CM | POA: Diagnosis not present

## 2016-01-05 DIAGNOSIS — J45909 Unspecified asthma, uncomplicated: Secondary | ICD-10-CM | POA: Insufficient documentation

## 2016-01-05 DIAGNOSIS — I10 Essential (primary) hypertension: Secondary | ICD-10-CM | POA: Insufficient documentation

## 2016-01-05 DIAGNOSIS — Z87891 Personal history of nicotine dependence: Secondary | ICD-10-CM | POA: Diagnosis not present

## 2016-01-05 DIAGNOSIS — M545 Low back pain, unspecified: Secondary | ICD-10-CM

## 2016-01-05 LAB — URINALYSIS, ROUTINE W REFLEX MICROSCOPIC
Bilirubin Urine: NEGATIVE
GLUCOSE, UA: NEGATIVE mg/dL
Hgb urine dipstick: NEGATIVE
Ketones, ur: NEGATIVE mg/dL
Leukocytes, UA: NEGATIVE
Nitrite: NEGATIVE
PH: 6.5 (ref 5.0–8.0)
Protein, ur: NEGATIVE mg/dL
SPECIFIC GRAVITY, URINE: 1.02 (ref 1.005–1.030)

## 2016-01-05 MED ORDER — HYDROCODONE-ACETAMINOPHEN 5-325 MG PO TABS
1.0000 | ORAL_TABLET | Freq: Once | ORAL | Status: AC
  Administered 2016-01-05: 1 via ORAL
  Filled 2016-01-05: qty 1

## 2016-01-05 MED ORDER — HYDROCODONE-ACETAMINOPHEN 5-325 MG PO TABS: ORAL_TABLET | ORAL | Status: DC

## 2016-01-05 MED ORDER — METHOCARBAMOL 500 MG PO TABS: 500.0000 mg | ORAL_TABLET | Freq: Two times a day (BID) | ORAL | Status: DC | PRN

## 2016-01-05 NOTE — Discharge Instructions (Signed)
Your urinalysis does not show any signs of infection.  Take vicodin for breakthrough pain, do not drink alcohol, drive, care for children or do other critical tasks while taking vicodin.  Please follow with your primary care doctor in the next 2 days for a check-up. They must obtain records for further management.   Do not hesitate to return to the Emergency Department for any new, worsening or concerning symptoms.    Back Pain, Adult Back pain is very common in adults.The cause of back pain is rarely dangerous and the pain often gets better over time.The cause of your back pain may not be known. Some common causes of back pain include:  Strain of the muscles or ligaments supporting the spine.  Wear and tear (degeneration) of the spinal disks.  Arthritis.  Direct injury to the back. For many people, back pain may return. Since back pain is rarely dangerous, most people can learn to manage this condition on their own. HOME CARE INSTRUCTIONS Watch your back pain for any changes. The following actions may help to lessen any discomfort you are feeling:  Remain active. It is stressful on your back to sit or stand in one place for long periods of time. Do not sit, drive, or stand in one place for more than 30 minutes at a time. Take short walks on even surfaces as soon as you are able.Try to increase the length of time you walk each day.  Exercise regularly as directed by your health care provider. Exercise helps your back heal faster. It also helps avoid future injury by keeping your muscles strong and flexible.  Do not stay in bed.Resting more than 1-2 days can delay your recovery.  Pay attention to your body when you bend and lift. The most comfortable positions are those that put less stress on your recovering back. Always use proper lifting techniques, including:  Bending your knees.  Keeping the load close to your body.  Avoiding twisting.  Find a comfortable position to  sleep. Use a firm mattress and lie on your side with your knees slightly bent. If you lie on your back, put a pillow under your knees.  Avoid feeling anxious or stressed.Stress increases muscle tension and can worsen back pain.It is important to recognize when you are anxious or stressed and learn ways to manage it, such as with exercise.  Take medicines only as directed by your health care provider. Over-the-counter medicines to reduce pain and inflammation are often the most helpful.Your health care provider may prescribe muscle relaxant drugs.These medicines help dull your pain so you can more quickly return to your normal activities and healthy exercise.  Apply ice to the injured area:  Put ice in a plastic bag.  Place a towel between your skin and the bag.  Leave the ice on for 20 minutes, 2-3 times a day for the first 2-3 days. After that, ice and heat may be alternated to reduce pain and spasms.  Maintain a healthy weight. Excess weight puts extra stress on your back and makes it difficult to maintain good posture. SEEK MEDICAL CARE IF:  You have pain that is not relieved with rest or medicine.  You have increasing pain going down into the legs or buttocks.  You have pain that does not improve in one week.  You have night pain.  You lose weight.  You have a fever or chills. SEEK IMMEDIATE MEDICAL CARE IF:   You develop new bowel or bladder control problems.  You have unusual weakness or numbness in your arms or legs.  You develop nausea or vomiting.  You develop abdominal pain.  You feel faint.   This information is not intended to replace advice given to you by your health care provider. Make sure you discuss any questions you have with your health care provider.   Document Released: 06/21/2005 Document Revised: 07/12/2014 Document Reviewed: 10/23/2013 Elsevier Interactive Patient Education [employer].

## 2016-01-05 NOTE — ED Notes (Addendum)
Pt reports intermittent back spasms which radiate to neck for the past 3 months. No known injuries.
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
## 2016-01-05 NOTE — ED Notes (Signed)
PT DISCHARGED. INSTRUCTIONS AND PRESCRIPTIONS GIVEN. AAOX4. PT IN NO APPARENT DISTRESS. THE OPPORTUNITY TO ASK QUESTIONS WAS PROVIDED. 

## 2016-01-07 ENCOUNTER — Ambulatory Visit (INDEPENDENT_AMBULATORY_CARE_PROVIDER_SITE_OTHER): Payer: Medicaid Other

## 2016-01-07 DIAGNOSIS — J4540 Moderate persistent asthma, uncomplicated: Secondary | ICD-10-CM

## 2016-01-08 MED ORDER — OMALIZUMAB 150 MG ~~LOC~~ SOLR
225.0000 mg | Freq: Once | SUBCUTANEOUS | Status: AC
  Administered 2016-01-08: 225 mg via SUBCUTANEOUS

## 2016-01-09 NOTE — Telephone Encounter (Signed)
Phone number is no longer in service; attempted to contact patient on her daughter's number-line busy. I then called her sister's number and left message to have Rogers call our office-we will need to update patient's phone number as well.

## 2016-01-09 NOTE — Telephone Encounter (Signed)
Attempted to contact pt. Line was busy. Will route back to [facility] for follow up.

## 2016-01-15 ENCOUNTER — Encounter: Payer: Self-pay | Admitting: *Deleted

## 2016-01-15 NOTE — Telephone Encounter (Signed)
Still unable to reach patient. I have mailed a letter to patient asking she contact the office so we can change her medication.

## 2016-01-20 ENCOUNTER — Telehealth: Payer: Self-pay | Admitting: Internal Medicine

## 2016-01-20 MED ORDER — TIOTROPIUM BROMIDE MONOHYDRATE 18 MCG IN CAPS
18.0000 ug | ORAL_CAPSULE | Freq: Every day | RESPIRATORY_TRACT | Status: DC
Start: 2016-01-20 — End: 2016-04-14

## 2016-01-20 NOTE — Telephone Encounter (Signed)
Per 01/02/16 phone note: Deneise Lever, MD at 01/02/2016 4:24 PM     Status: Signed       Expand All Collapse All   Please advise patient that insurance won't cover Anoro.  Offer script for Spiriva Handihaler # 30, inhale once daily Refill x 12       Called spoke with pt. Aware of change in medications. RX sent in. Nothing further needed

## 2016-01-21 ENCOUNTER — Ambulatory Visit: Payer: Medicaid Other

## 2016-01-22 ENCOUNTER — Encounter: Payer: Medicaid Other | Admitting: Obstetrics & Gynecology

## 2016-01-22 ENCOUNTER — Ambulatory Visit (INDEPENDENT_AMBULATORY_CARE_PROVIDER_SITE_OTHER): Payer: Medicaid Other

## 2016-01-22 DIAGNOSIS — J4540 Moderate persistent asthma, uncomplicated: Secondary | ICD-10-CM

## 2016-01-23 MED ORDER — OMALIZUMAB 150 MG ~~LOC~~ SOLR
225.0000 mg | Freq: Once | SUBCUTANEOUS | Status: AC
  Administered 2016-01-22: 225 mg via SUBCUTANEOUS

## 2016-01-27 ENCOUNTER — Telehealth: Payer: Self-pay | Admitting: Internal Medicine

## 2016-01-27 NOTE — Telephone Encounter (Signed)
#   vials:4 Ordered date:01/27/16 Shipping Date:02/02/16  # Vials:4 Arrival Date:02/03/16 Lot [id number] Exp Date:12/20

## 2016-02-09 ENCOUNTER — Other Ambulatory Visit: Payer: Self-pay | Admitting: Internal Medicine

## 2016-02-10 ENCOUNTER — Other Ambulatory Visit: Payer: Self-pay | Admitting: Internal Medicine

## 2016-02-10 NOTE — Telephone Encounter (Signed)
Refill request for Duoneb. Rx sent. Nothing further needed.

## 2016-02-13 ENCOUNTER — Other Ambulatory Visit: Payer: Self-pay | Admitting: Internal Medicine

## 2016-02-17 ENCOUNTER — Other Ambulatory Visit: Payer: Self-pay | Admitting: Internal Medicine

## 2016-02-18 ENCOUNTER — Encounter: Payer: Medicaid Other | Admitting: Obstetrics & Gynecology

## 2016-02-25 ENCOUNTER — Ambulatory Visit: Payer: Medicaid Other

## 2016-02-26 ENCOUNTER — Ambulatory Visit (INDEPENDENT_AMBULATORY_CARE_PROVIDER_SITE_OTHER): Payer: Medicaid Other

## 2016-02-26 DIAGNOSIS — J45909 Unspecified asthma, uncomplicated: Secondary | ICD-10-CM

## 2016-03-01 DIAGNOSIS — J45909 Unspecified asthma, uncomplicated: Secondary | ICD-10-CM

## 2016-03-01 MED ORDER — OMALIZUMAB 150 MG ~~LOC~~ SOLR
225.0000 mg | Freq: Once | SUBCUTANEOUS | Status: AC
  Administered 2016-03-01: 225 mg via SUBCUTANEOUS

## 2016-03-20 LAB — LAB REPORT - SCANNED

## 2016-03-24 ENCOUNTER — Ambulatory Visit: Payer: Medicaid Other

## 2016-03-26 ENCOUNTER — Ambulatory Visit: Payer: Medicaid Other

## 2016-03-30 ENCOUNTER — Telehealth: Payer: Self-pay | Admitting: Internal Medicine

## 2016-03-30 NOTE — Telephone Encounter (Signed)
Pt is aware. I have also informed [employer] as well.

## 2016-03-30 NOTE — Telephone Encounter (Signed)
Pt is scheduled for xolair tomorrow. Pt wants to know if she can also get flu shot same day. Please advise Dr. [name] thanks

## 2016-03-30 NOTE — Telephone Encounter (Signed)
Yes

## 2016-03-31 ENCOUNTER — Ambulatory Visit (INDEPENDENT_AMBULATORY_CARE_PROVIDER_SITE_OTHER): Payer: Medicaid Other

## 2016-03-31 DIAGNOSIS — Z23 Encounter for immunization: Secondary | ICD-10-CM

## 2016-03-31 DIAGNOSIS — J4540 Moderate persistent asthma, uncomplicated: Secondary | ICD-10-CM

## 2016-04-01 ENCOUNTER — Ambulatory Visit: Payer: Medicaid Other

## 2016-04-02 MED ORDER — OMALIZUMAB 150 MG ~~LOC~~ SOLR
225.0000 mg | Freq: Once | SUBCUTANEOUS | Status: AC
  Administered 2016-03-31: 225 mg via SUBCUTANEOUS

## 2016-04-08 ENCOUNTER — Other Ambulatory Visit: Payer: Self-pay | Admitting: Internal Medicine

## 2016-04-08 MED ORDER — IPRATROPIUM-ALBUTEROL 0.5-2.5 (3) MG/3ML IN SOLN: 3.0000 mL | RESPIRATORY_TRACT | 2 refills | Status: DC | PRN

## 2016-04-08 MED ORDER — TIOTROPIUM BROMIDE MONOHYDRATE 18 MCG IN CAPS: 18.0000 ug | ORAL_CAPSULE | Freq: Every day | RESPIRATORY_TRACT | 2 refills | Status: DC

## 2016-04-08 NOTE — Telephone Encounter (Signed)
Rx's already sent

## 2016-04-13 ENCOUNTER — Telehealth: Payer: Self-pay | Admitting: *Deleted

## 2016-04-13 NOTE — Telephone Encounter (Signed)
#   vials:4 Ordered date:04/13/16 Shipping Date:04/13/16

## 2016-04-14 ENCOUNTER — Encounter (HOSPITAL_COMMUNITY): Payer: Self-pay

## 2016-04-14 ENCOUNTER — Ambulatory Visit (INDEPENDENT_AMBULATORY_CARE_PROVIDER_SITE_OTHER): Payer: Medicaid Other

## 2016-04-14 ENCOUNTER — Emergency Department (HOSPITAL_COMMUNITY)
Admission: EM | Admit: 2016-04-14 | Discharge: 2016-04-14 | Disposition: A | Discharge status: Home / Self Care | Payer: Medicaid Other | Attending: Emergency Medicine | Admitting: Emergency Medicine

## 2016-04-14 DIAGNOSIS — Z79899 Other long term (current) drug therapy: Secondary | ICD-10-CM | POA: Diagnosis not present

## 2016-04-14 DIAGNOSIS — G8929 Other chronic pain: Secondary | ICD-10-CM | POA: Insufficient documentation

## 2016-04-14 DIAGNOSIS — Z87891 Personal history of nicotine dependence: Secondary | ICD-10-CM | POA: Diagnosis not present

## 2016-04-14 DIAGNOSIS — M545 Low back pain, unspecified: Secondary | ICD-10-CM

## 2016-04-14 DIAGNOSIS — J45909 Unspecified asthma, uncomplicated: Secondary | ICD-10-CM | POA: Insufficient documentation

## 2016-04-14 DIAGNOSIS — I10 Essential (primary) hypertension: Secondary | ICD-10-CM | POA: Diagnosis not present

## 2016-04-14 DIAGNOSIS — J4520 Mild intermittent asthma, uncomplicated: Secondary | ICD-10-CM | POA: Diagnosis not present

## 2016-04-14 DIAGNOSIS — R109 Unspecified abdominal pain: Secondary | ICD-10-CM | POA: Insufficient documentation

## 2016-04-14 LAB — URINALYSIS, ROUTINE W REFLEX MICROSCOPIC
Bilirubin Urine: NEGATIVE
GLUCOSE, UA: NEGATIVE mg/dL
HGB URINE DIPSTICK: NEGATIVE
Ketones, ur: NEGATIVE mg/dL
Leukocytes, UA: NEGATIVE
Nitrite: NEGATIVE
PH: 7.5 (ref 5.0–8.0)
PROTEIN: NEGATIVE mg/dL
Specific Gravity, Urine: 1.015 (ref 1.005–1.030)

## 2016-04-14 MED ORDER — METHOCARBAMOL 500 MG PO TABS
500.0000 mg | ORAL_TABLET | Freq: Once | ORAL | Status: AC
  Administered 2016-04-14: 500 mg via ORAL
  Filled 2016-04-14: qty 1

## 2016-04-14 MED ORDER — OMALIZUMAB 150 MG ~~LOC~~ SOLR
225.0000 mg | SUBCUTANEOUS | Status: DC
  Administered 2016-04-14: 225 mg via SUBCUTANEOUS

## 2016-04-14 MED ORDER — HYDROMORPHONE HCL 2 MG/ML IJ SOLN
2.0000 mg | Freq: Once | INTRAMUSCULAR | Status: AC
  Administered 2016-04-14: 2 mg via INTRAMUSCULAR
  Filled 2016-04-14: qty 1

## 2016-04-14 MED ORDER — METHOCARBAMOL 500 MG PO TABS: 500.0000 mg | ORAL_TABLET | Freq: Two times a day (BID) | ORAL | 0 refills | Status: DC | PRN

## 2016-04-14 NOTE — Telephone Encounter (Signed)
#   Vials:4 Arrival Date:04/14/16 Lot [id number] Exp Date:4/21
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
## 2016-04-14 NOTE — ED Triage Notes (Signed)
Pt here with back pain.  Been seen before for same.  Pain over months.  Pt states today, pain has increased.  Some burning with urination.  Pain is in lower back.

## 2016-04-17 ENCOUNTER — Other Ambulatory Visit: Payer: Self-pay | Admitting: Internal Medicine

## 2016-04-19 NOTE — Telephone Encounter (Signed)
CY Please advise on refill. Thanks.  

## 2016-04-20 ENCOUNTER — Encounter: Payer: Self-pay | Admitting: Obstetrics

## 2016-04-20 ENCOUNTER — Ambulatory Visit (INDEPENDENT_AMBULATORY_CARE_PROVIDER_SITE_OTHER): Payer: Medicaid Other | Admitting: Obstetrics

## 2016-04-20 ENCOUNTER — Other Ambulatory Visit (HOSPITAL_COMMUNITY)
Admission: RE | Admit: 2016-04-20 | Discharge: 2016-04-20 | Disposition: A | Discharge status: Home / Self Care | Payer: Medicaid Other | Source: Ambulatory Visit | Attending: Obstetrics | Admitting: Obstetrics

## 2016-04-20 VITALS — BP 141/73 | HR 81 | Temp 98.1°F | Ht 60.0 in

## 2016-04-20 DIAGNOSIS — Z113 Encounter for screening for infections with a predominantly sexual mode of transmission: Secondary | ICD-10-CM | POA: Diagnosis present

## 2016-04-20 DIAGNOSIS — Z01419 Encounter for gynecological examination (general) (routine) without abnormal findings: Secondary | ICD-10-CM | POA: Insufficient documentation

## 2016-04-20 DIAGNOSIS — Z124 Encounter for screening for malignant neoplasm of cervix: Secondary | ICD-10-CM | POA: Diagnosis not present

## 2016-04-20 DIAGNOSIS — N760 Acute vaginitis: Secondary | ICD-10-CM | POA: Diagnosis present

## 2016-04-20 DIAGNOSIS — Z1151 Encounter for screening for human papillomavirus (HPV): Secondary | ICD-10-CM | POA: Diagnosis not present
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
## 2016-04-20 NOTE — Telephone Encounter (Signed)
Ok to refill 6 months 

## 2016-04-21 ENCOUNTER — Telehealth: Payer: Self-pay | Admitting: Internal Medicine

## 2016-04-21 LAB — HIV ANTIBODY (ROUTINE TESTING W REFLEX): HIV Screen 4th Generation wRfx: NONREACTIVE

## 2016-04-21 LAB — HEPATITIS B SURFACE ANTIGEN: HEP B S AG: NEGATIVE

## 2016-04-21 LAB — CERVICOVAGINAL ANCILLARY ONLY
Chlamydia: NEGATIVE
NEISSERIA GONORRHEA: NEGATIVE
Trichomonas: NEGATIVE

## 2016-04-21 LAB — RPR: RPR: NONREACTIVE

## 2016-04-21 LAB — HEPATITIS C ANTIBODY

## 2016-04-21 NOTE — Telephone Encounter (Signed)
Please advise if okay to refill zolpidem 10 mg daily Last filled on 11-11-15 # 30

## 2016-04-22 MED ORDER — ZOLPIDEM TARTRATE 10 MG PO TABS: 10.0000 mg | ORAL_TABLET | Freq: Every day | ORAL | 5 refills | Status: DC

## 2016-04-22 NOTE — Telephone Encounter (Signed)
Ok to refill 6 months 

## 2016-04-22 NOTE — Telephone Encounter (Signed)
[facility].  Unable to give #6 refills d/t it being a controlled med.   Nothing further needed.

## 2016-04-23 LAB — CERVICOVAGINAL ANCILLARY ONLY
Bacterial vaginitis: POSITIVE — AB
Candida vaginitis: NEGATIVE

## 2016-04-23 LAB — CYTOLOGY - PAP
DIAGNOSIS: NEGATIVE
HPV (WINDOPATH): NOT DETECTED

## 2016-04-26 ENCOUNTER — Other Ambulatory Visit: Payer: Self-pay | Admitting: Obstetrics

## 2016-04-26 DIAGNOSIS — N760 Acute vaginitis: Principal | ICD-10-CM

## 2016-04-26 DIAGNOSIS — B9689 Other specified bacterial agents as the cause of diseases classified elsewhere: Secondary | ICD-10-CM

## 2016-04-26 MED ORDER — METRONIDAZOLE 0.75 % VA GEL: 1.0000 | Freq: Two times a day (BID) | VAGINAL | 0 refills | Status: DC

## 2016-05-11 ENCOUNTER — Other Ambulatory Visit: Payer: Self-pay | Admitting: Obstetrics

## 2016-05-11 DIAGNOSIS — N760 Acute vaginitis: Principal | ICD-10-CM

## 2016-05-11 DIAGNOSIS — B9689 Other specified bacterial agents as the cause of diseases classified elsewhere: Secondary | ICD-10-CM

## 2016-05-14 ENCOUNTER — Ambulatory Visit (INDEPENDENT_AMBULATORY_CARE_PROVIDER_SITE_OTHER): Payer: Medicaid Other

## 2016-05-14 ENCOUNTER — Ambulatory Visit (INDEPENDENT_AMBULATORY_CARE_PROVIDER_SITE_OTHER): Payer: Medicaid Other | Admitting: Internal Medicine

## 2016-05-14 ENCOUNTER — Encounter: Payer: Self-pay | Admitting: Internal Medicine

## 2016-05-14 VITALS — BP 122/78 | HR 89 | Ht 61.0 in | Wt 218.0 lb

## 2016-05-14 DIAGNOSIS — J4540 Moderate persistent asthma, uncomplicated: Secondary | ICD-10-CM | POA: Diagnosis not present

## 2016-05-14 DIAGNOSIS — G4733 Obstructive sleep apnea (adult) (pediatric): Secondary | ICD-10-CM | POA: Diagnosis not present

## 2016-05-14 DIAGNOSIS — E662 Morbid (severe) obesity with alveolar hypoventilation: Secondary | ICD-10-CM

## 2016-05-14 MED ORDER — ALBUTEROL SULFATE HFA 108 (90 BASE) MCG/ACT IN AERS: INHALATION_SPRAY | RESPIRATORY_TRACT | 99 refills | Status: DC

## 2016-05-14 MED ORDER — MONTELUKAST SODIUM 10 MG PO TABS: ORAL_TABLET | ORAL | 12 refills | Status: DC

## 2016-05-14 MED ORDER — OMALIZUMAB 150 MG ~~LOC~~ SOLR
225.0000 mg | SUBCUTANEOUS | Status: DC
  Administered 2016-05-14: 225 mg via SUBCUTANEOUS

## 2016-05-14 MED ORDER — IPRATROPIUM-ALBUTEROL 0.5-2.5 (3) MG/3ML IN SOLN: 3.0000 mL | RESPIRATORY_TRACT | 12 refills | Status: DC | PRN

## 2016-05-14 MED ORDER — UMECLIDINIUM-VILANTEROL 62.5-25 MCG/INH IN AEPB: 1.0000 | INHALATION_SPRAY | Freq: Every day | RESPIRATORY_TRACT | 12 refills | Status: DC

## 2016-05-14 MED ORDER — EPINEPHRINE 0.3 MG/0.3ML IJ SOAJ: INTRAMUSCULAR | 99 refills | Status: DC

## 2016-05-14 NOTE — Assessment & Plan Note (Signed)
I can't tell that she is having a significant exacerbation at this time. We discussed medications. Plan-stop Spiriva.

## 2016-05-14 NOTE — Progress Notes (Signed)
Patient ID: Kelly Butler, female    DOB: July 25, 1960, 55 y.o.   MRN: [id number]  HPI  F former smoker followed for Asthma, Allergic rhinitis, GERD, complicated by GERD, VCD, chronic headache.  Office Spirometry 10/08/14- moderate restriction. FVC 1. for 3/60%, FEV1 1.22/61%, FEV1/FVC 0.85, FEF 25-75 percent 2.05/76%.   04/09/15- 63 yo F former smoker followed for Asthma, Allergic rhinitis, GERD, complicated by GERD, VCD, glaucoma/ Timoptic, chronic headache, insomnia    FOLLOWS FOR: pt states she is doing ok. pt states when she walks or does activity she gets an increase in SOB.  pt uses nebulizer with relief . pt states she currently has a little chest tightness and wheezing and has had to use her inhailer more regularly.   pt states she has a concern with any activity  and when sexually active she gets a shortness of breath that makes her feel like she is having a heart attack and can not catch her breath. pt states she uses her nebulizer in those events.  Keeps head of bed elevated. Nebulizer machine works better than metered rescue inhaler. Chest x-ray images reviewed with her. CXR 10/08/14 IMPRESSION: Unchanged cardiomegaly. No acute cardiopulmonary disease. Electronically Signed  By: Dereck Ligas M.D.  On: 10/08/2014 12:34  11/11/2015-55 year old female former smoker followed for Asthma, Allergic rhinitis, complicated by GERD, VCD, glaucoma/Timoptic, chronic headaches, insomnia Xolair started 10/29/2015 FOLLOWS FOR: Pt states she has been using her nebulizer for about 1 month now due to wheezing and SOB. Pt started Xolair on 10-29-15 and no reactions. Cough with scant thick yellow mucus and some chest tightness. Nebulizer helps but she is using it more often. Rescue albuterol inhaler not as much help. Easier dyspnea on exertion.  05/14/2016-55 year old female former smoker followed for Asthma, allergic rhinitis, complicated by GERD, VCD, glaucom, chronic headaches, insomnia Xolair  started 10/29/2015 FOLLOWS FOR:Pt continues Xolair injections without reactions; has noticed she continues to have SOB and gives out with lengthy exertion and conversations. She still notices easily dyspneic with exertion or prolonged conversations. Less wheeze on Xolair. Has used her nebulizer more in the last 2 weeks since weather got cold. No longer on Timoptic. Had been using both an oral and Spiriva-shouldn't need both and we would like to minimize the LAMA with glaucoma.  Review of Systems-see HPI Constitutional:      +weight gain, no-night sweats, fevers, chills, fatigue, lassitude. HEENT:   +  headaches,  No-difficulty swallowing, tooth/dental problems, sore throat,       No-  sneezing, itching, ear ache, +nasal congestion, post nasal drip,  CV:  No- chest pain,  No-orthopnea, PND, swelling in lower extremities, anasarca,  dizziness, palpitations Resp: + shortness of breath with exertion or at rest.           productive cough,  + non-productive cough,  No- coughing up of blood.              No-   change in color of mucus.  + wheezing.   Skin: No-   rash or lesions. GI: +   heartburn, indigestion, no-abdominal pain, nausea, vomiting,  GU:  MS:  +  joint pain or swelling.  Neuro-     nothing unusual Psych:  No- change in mood or affect. No depression or anxiety.  No memory loss.  Objective:   Physical Exam General- Alert, Oriented, Affect-appropriate, Distress- none acute,  +overweight (tight/ round habitus) Skin- rash-none, lesions- none, excoriation- none Lymphadenopathy- none Head- atraumatic  Eyes- Gross vision intact, PERRLA, conjunctivae clear secretions            Ears- Hearing, canals normal            Nose- Clear, No-Septal dev, mucus, polyps, erosion, perforation             Throat- Mallampati III-IV ,  drainage- none, tonsils- atrophic.  Neck- flexible , trachea midline, no stridor , thyroid nl, carotid no bruit Chest - symmetrical excursion , unlabored            Heart/CV- RRR , 1/6 SEM murmur , no gallop  , no rub, nl s1 s2                           - JVD- none , edema- none, stasis changes- none, varices- none           Lung- +distant, wheeze + trace, cough- slight , dullness-none, rub- none           Chest wall-  Abd-  Br/ Gen/ Rectal- Not done, not indicated Extrem- cyanosis- none, clubbing, none, atrophy- none, strength- nl Neuro- grossly intact to observation

## 2016-05-14 NOTE — Assessment & Plan Note (Signed)
Minimal when tested. She does not describe symptoms suggesting worsening we need to watch for this.

## 2016-05-14 NOTE — Assessment & Plan Note (Signed)
Definite "apple" body habitus. Discussed looking at her it is obvious she doesn't have much room to take a deep breath and I think this is probably her most significant respiratory issue. Weight loss discussed.

## 2016-05-14 NOTE — Patient Instructions (Signed)
Med refills sent  Suggest wearing a scarf across your nose and mouth when outside in really cold air.  Please call if we can help

## 2016-05-25 ENCOUNTER — Ambulatory Visit: Payer: Medicaid Other

## 2016-05-28 ENCOUNTER — Ambulatory Visit (INDEPENDENT_AMBULATORY_CARE_PROVIDER_SITE_OTHER): Payer: Medicaid Other

## 2016-05-28 DIAGNOSIS — J4520 Mild intermittent asthma, uncomplicated: Secondary | ICD-10-CM

## 2016-05-31 ENCOUNTER — Telehealth: Payer: Self-pay | Admitting: Internal Medicine

## 2016-05-31 MED ORDER — OMALIZUMAB 150 MG ~~LOC~~ SOLR
225.0000 mg | SUBCUTANEOUS | Status: DC
  Administered 2016-05-28: 225 mg via SUBCUTANEOUS

## 2016-05-31 NOTE — Telephone Encounter (Signed)
#   vials:4 Ordered date:05/31/16 Shipping Date:06/07/16

## 2016-06-08 NOTE — Telephone Encounter (Signed)
#   Vials:4 Arrival Date:06/08/16  Lot [id number] Exp Date:11/2019

## 2016-06-11 ENCOUNTER — Ambulatory Visit: Payer: Medicaid Other

## 2016-06-14 ENCOUNTER — Ambulatory Visit (INDEPENDENT_AMBULATORY_CARE_PROVIDER_SITE_OTHER): Payer: Medicaid Other

## 2016-06-14 ENCOUNTER — Telehealth: Payer: Self-pay | Admitting: Internal Medicine

## 2016-06-14 DIAGNOSIS — J4540 Moderate persistent asthma, uncomplicated: Secondary | ICD-10-CM

## 2016-06-14 MED ORDER — FLUTICASONE-UMECLIDIN-VILANT 100-62.5-25 MCG/INH IN AEPB: 1.0000 | INHALATION_SPRAY | Freq: Every day | RESPIRATORY_TRACT | 0 refills | Status: DC

## 2016-06-14 NOTE — Telephone Encounter (Signed)
Per CY-we can have patient use Trelegy inhaler for 2 weeks and see if this helps. Pt is aware that she should be off the Anoro while using the new inhaler. Pt will come by the office to pick up sample.

## 2016-06-15 MED ORDER — OMALIZUMAB 150 MG ~~LOC~~ SOLR
225.0000 mg | SUBCUTANEOUS | Status: DC
  Administered 2016-06-14: 225 mg via SUBCUTANEOUS

## 2016-06-23 ENCOUNTER — Other Ambulatory Visit: Payer: Self-pay | Admitting: Internal Medicine

## 2016-07-01 ENCOUNTER — Ambulatory Visit (INDEPENDENT_AMBULATORY_CARE_PROVIDER_SITE_OTHER): Payer: Medicaid Other

## 2016-07-01 DIAGNOSIS — J4540 Moderate persistent asthma, uncomplicated: Secondary | ICD-10-CM | POA: Diagnosis not present

## 2016-07-08 ENCOUNTER — Telehealth: Payer: Self-pay | Admitting: Internal Medicine

## 2016-07-08 MED ORDER — FLUTICASONE-UMECLIDIN-VILANT 100-62.5-25 MCG/INH IN AEPB: 1.0000 | INHALATION_SPRAY | Freq: Every day | RESPIRATORY_TRACT | 11 refills | Status: DC

## 2016-07-08 NOTE — Telephone Encounter (Signed)
#   vials:4 Ordered date:07/08/16 Shipping Date:07/13/16

## 2016-07-08 NOTE — Telephone Encounter (Signed)
Pt requesting refill on trelegy.  This has been sent to preferred pharmacy.  Nothing further needed.

## 2016-07-14 ENCOUNTER — Ambulatory Visit: Payer: Medicaid Other

## 2016-07-14 ENCOUNTER — Ambulatory Visit (INDEPENDENT_AMBULATORY_CARE_PROVIDER_SITE_OTHER): Payer: Medicaid Other

## 2016-07-14 DIAGNOSIS — J4540 Moderate persistent asthma, uncomplicated: Secondary | ICD-10-CM | POA: Diagnosis not present

## 2016-07-14 NOTE — Telephone Encounter (Signed)
#   Vials:4 Arrival Date:07/14/16 Lot [id number] Exp Date:8/21

## 2016-07-15 ENCOUNTER — Ambulatory Visit: Payer: Medicaid Other

## 2016-07-16 MED ORDER — OMALIZUMAB 150 MG ~~LOC~~ SOLR
225.0000 mg | SUBCUTANEOUS | Status: DC
  Administered 2016-07-14: 225 mg via SUBCUTANEOUS

## 2016-07-27 ENCOUNTER — Ambulatory Visit (INDEPENDENT_AMBULATORY_CARE_PROVIDER_SITE_OTHER): Payer: Medicaid Other

## 2016-07-27 DIAGNOSIS — J4540 Moderate persistent asthma, uncomplicated: Secondary | ICD-10-CM | POA: Diagnosis not present

## 2016-08-02 ENCOUNTER — Telehealth: Payer: Self-pay | Admitting: Internal Medicine

## 2016-08-02 NOTE — Telephone Encounter (Signed)
Order was placed 08/02/16, rep. Said P/A had exp., no form. I ask her if they had sent [place] a form, no, that's another dept. To go ahead and place the order and then the P/A would be faxed once the order is stopped.   # vials: 4 Ordered date:08/02/16 Shipping Date:08/05/16

## 2016-08-03 NOTE — Telephone Encounter (Signed)
[facility] on 08/03/2016 for Prior Auth for Xolair. Any questions  [phone] [id number] Dorothea Ogle

## 2016-08-04 NOTE — Telephone Encounter (Addendum)
Faxed P/A 08/04/16 (when I got it). It was sent ok.

## 2016-08-09 NOTE — Telephone Encounter (Signed)
I returned Kelly Butler's call [email or web address].

## 2016-08-09 NOTE — Telephone Encounter (Signed)
#   vials:4 Ordered date:08/09/16 Shipping Date:08/09/16

## 2016-08-09 NOTE — Telephone Encounter (Signed)
Kelly Butler, [place], [phone] ext [phone].  Order scheduled for delivery.  He worked on [employer].  For their records need PA# and effective dates for Xolair.

## 2016-08-09 NOTE — Telephone Encounter (Signed)
Dorothea Ogle from [facility] is returning Kelly Butler's phone call. [place] need Auth number and effective dates for patient's xolair prescription.

## 2016-08-10 ENCOUNTER — Ambulatory Visit (HOSPITAL_COMMUNITY)
Admission: EM | Admit: 2016-08-10 | Discharge: 2016-08-10 | Disposition: A | Discharge status: Home / Self Care | Payer: Medicaid Other | Attending: Family Medicine | Admitting: Family Medicine

## 2016-08-10 DIAGNOSIS — S39012A Strain of muscle, fascia and tendon of lower back, initial encounter: Secondary | ICD-10-CM | POA: Diagnosis not present

## 2016-08-10 MED ORDER — PREDNISONE 20 MG PO TABS: ORAL_TABLET | ORAL | 0 refills | Status: DC

## 2016-08-10 MED ORDER — HYDROCODONE-ACETAMINOPHEN 5-325 MG PO TABS: 1.0000 | ORAL_TABLET | Freq: Four times a day (QID) | ORAL | 0 refills | Status: DC | PRN

## 2016-08-10 MED ORDER — CYCLOBENZAPRINE HCL 5 MG PO TABS: 5.0000 mg | ORAL_TABLET | Freq: Three times a day (TID) | ORAL | 0 refills | Status: DC | PRN

## 2016-08-10 NOTE — Telephone Encounter (Signed)
#   Vials: 4 Arrival Date:08/10/16 Lot [id number] Exp Date:9/21

## 2016-08-10 NOTE — Telephone Encounter (Signed)
Called Dorothea Ogle back and gave him the info. He needed. I asked him tcb if he had any questions.
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
## 2016-08-10 NOTE — ED Triage Notes (Signed)
C/o lower back pain States she was bending over at work and twisted wrong

## 2016-08-10 NOTE — Telephone Encounter (Signed)
I didn't hear anything else from Kelly Butler. He must have received my message. Nothing further needed.

## 2016-08-19 ENCOUNTER — Ambulatory Visit (INDEPENDENT_AMBULATORY_CARE_PROVIDER_SITE_OTHER): Payer: Medicaid Other

## 2016-08-19 ENCOUNTER — Ambulatory Visit: Payer: Medicaid Other

## 2016-08-19 DIAGNOSIS — J4540 Moderate persistent asthma, uncomplicated: Secondary | ICD-10-CM | POA: Diagnosis not present

## 2016-08-23 MED ORDER — OMALIZUMAB 150 MG ~~LOC~~ SOLR
225.0000 mg | SUBCUTANEOUS | Status: DC
  Administered 2016-08-19: 225 mg via SUBCUTANEOUS

## 2016-09-01 ENCOUNTER — Other Ambulatory Visit: Payer: Self-pay | Admitting: Internal Medicine

## 2016-09-01 DIAGNOSIS — Z1231 Encounter for screening mammogram for malignant neoplasm of breast: Secondary | ICD-10-CM

## 2016-09-02 ENCOUNTER — Ambulatory Visit: Payer: Medicaid Other

## 2016-09-03 ENCOUNTER — Ambulatory Visit (INDEPENDENT_AMBULATORY_CARE_PROVIDER_SITE_OTHER): Payer: Medicaid Other

## 2016-09-03 DIAGNOSIS — J4540 Moderate persistent asthma, uncomplicated: Secondary | ICD-10-CM

## 2016-09-03 MED ORDER — OMALIZUMAB 150 MG ~~LOC~~ SOLR
225.0000 mg | SUBCUTANEOUS | Status: DC
  Administered 2016-09-03: 225 mg via SUBCUTANEOUS

## 2016-09-07 ENCOUNTER — Telehealth: Payer: Self-pay | Admitting: Internal Medicine

## 2016-09-07 NOTE — Telephone Encounter (Signed)
#   vials:4 Ordered date:09/07/16 Shipping Date:09/13/16

## 2016-09-09 DIAGNOSIS — J4540 Moderate persistent asthma, uncomplicated: Secondary | ICD-10-CM

## 2016-09-09 MED ORDER — OMALIZUMAB 150 MG ~~LOC~~ SOLR
225.0000 mg | Freq: Once | SUBCUTANEOUS | Status: AC
  Administered 2016-09-09: 225 mg via SUBCUTANEOUS

## 2016-09-14 NOTE — Telephone Encounter (Signed)
#   Vials:4 Arrival Date:09/14/16 Lot #:[id number]  Exp Date:9/21

## 2016-09-17 ENCOUNTER — Other Ambulatory Visit: Payer: Self-pay | Admitting: Orthopedic Surgery

## 2016-09-17 ENCOUNTER — Ambulatory Visit: Payer: Medicaid Other

## 2016-09-17 DIAGNOSIS — R52 Pain, unspecified: Secondary | ICD-10-CM

## 2016-09-17 DIAGNOSIS — R531 Weakness: Secondary | ICD-10-CM

## 2016-09-17 DIAGNOSIS — R200 Anesthesia of skin: Secondary | ICD-10-CM

## 2016-09-20 ENCOUNTER — Ambulatory Visit
Admission: RE | Admit: 2016-09-20 | Discharge: 2016-09-20 | Disposition: A | Discharge status: Home / Self Care | Payer: Medicaid Other | Source: Ambulatory Visit | Attending: Orthopedic Surgery | Admitting: Orthopedic Surgery

## 2016-09-20 DIAGNOSIS — R200 Anesthesia of skin: Secondary | ICD-10-CM

## 2016-09-20 DIAGNOSIS — R52 Pain, unspecified: Secondary | ICD-10-CM

## 2016-09-20 DIAGNOSIS — R531 Weakness: Secondary | ICD-10-CM

## 2016-09-22 ENCOUNTER — Ambulatory Visit (INDEPENDENT_AMBULATORY_CARE_PROVIDER_SITE_OTHER): Payer: Medicaid Other

## 2016-09-22 ENCOUNTER — Encounter: Payer: Self-pay | Admitting: Internal Medicine

## 2016-09-22 DIAGNOSIS — J4540 Moderate persistent asthma, uncomplicated: Secondary | ICD-10-CM

## 2016-09-23 ENCOUNTER — Ambulatory Visit: Payer: Medicaid Other | Admitting: Obstetrics

## 2016-09-23 MED ORDER — OMALIZUMAB 150 MG ~~LOC~~ SOLR
225.0000 mg | SUBCUTANEOUS | Status: DC
  Administered 2016-09-22: 225 mg via SUBCUTANEOUS

## 2016-09-23 NOTE — Progress Notes (Signed)
Xolair injection documentation and charges entered by Reilyn Nelson, RMA, based on injection sheet filled out by Tammy Scott per office protocol.   

## 2016-10-05 ENCOUNTER — Ambulatory Visit: Payer: Medicaid Other | Admitting: Obstetrics

## 2016-10-05 ENCOUNTER — Telehealth: Payer: Self-pay | Admitting: Internal Medicine

## 2016-10-05 NOTE — Telephone Encounter (Signed)
#   vials:4 Ordered date:10/05/16 Shipping Date:10/12/16

## 2016-10-06 ENCOUNTER — Ambulatory Visit: Payer: Medicaid Other

## 2016-10-13 ENCOUNTER — Ambulatory Visit (INDEPENDENT_AMBULATORY_CARE_PROVIDER_SITE_OTHER): Payer: Medicaid Other

## 2016-10-13 DIAGNOSIS — J4540 Moderate persistent asthma, uncomplicated: Secondary | ICD-10-CM

## 2016-10-14 MED ORDER — OMALIZUMAB 150 MG ~~LOC~~ SOLR
225.0000 mg | SUBCUTANEOUS | Status: DC
  Administered 2016-10-13: 225 mg via SUBCUTANEOUS

## 2016-10-14 NOTE — Progress Notes (Signed)
Xolair injection documentation and charges entered by Ashley Caulfield, RMA, based on injection sheet filled out by Tammy Scott per office protocol.   

## 2016-10-15 ENCOUNTER — Other Ambulatory Visit: Payer: Self-pay | Admitting: Internal Medicine

## 2016-10-18 NOTE — Telephone Encounter (Signed)
#   vials:4 p/a was approved, had to reorder Ordered date:10/18/16 Shipping Date:10/19/16

## 2016-10-19 ENCOUNTER — Telehealth: Payer: Self-pay | Admitting: Internal Medicine

## 2016-10-19 ENCOUNTER — Other Ambulatory Visit: Payer: Self-pay

## 2016-10-19 MED ORDER — EPINEPHRINE 0.3 MG/0.3ML IJ SOAJ: 0.3000 mg | Freq: Once | INTRAMUSCULAR | 0 refills | Status: AC

## 2016-10-19 NOTE — Telephone Encounter (Signed)
Received a PA from pt's pharmacy stating patient needed a PA for her epi pen.   Called [facility] to start a PA. They informed me the patient does not need a PA as long as the generic medication was called in.   Sent in generic RX. Nothing else needed.

## 2016-10-20 NOTE — Telephone Encounter (Signed)
#   Vials:4 Arrival Date:10/20/16 Lot #:[id number] Exp Date:9/21

## 2016-10-22 ENCOUNTER — Ambulatory Visit: Payer: Medicaid Other

## 2016-10-25 ENCOUNTER — Encounter: Payer: Self-pay | Admitting: Internal Medicine

## 2016-10-25 ENCOUNTER — Ambulatory Visit (INDEPENDENT_AMBULATORY_CARE_PROVIDER_SITE_OTHER): Payer: Medicaid Other

## 2016-10-25 ENCOUNTER — Ambulatory Visit (INDEPENDENT_AMBULATORY_CARE_PROVIDER_SITE_OTHER): Payer: Medicaid Other | Admitting: Internal Medicine

## 2016-10-25 VITALS — BP 128/88 | HR 81 | Ht 61.0 in | Wt 211.6 lb

## 2016-10-25 DIAGNOSIS — E662 Morbid (severe) obesity with alveolar hypoventilation: Secondary | ICD-10-CM | POA: Diagnosis not present

## 2016-10-25 DIAGNOSIS — K219 Gastro-esophageal reflux disease without esophagitis: Secondary | ICD-10-CM

## 2016-10-25 DIAGNOSIS — J4540 Moderate persistent asthma, uncomplicated: Secondary | ICD-10-CM | POA: Diagnosis not present

## 2016-10-25 DIAGNOSIS — J45901 Unspecified asthma with (acute) exacerbation: Secondary | ICD-10-CM | POA: Diagnosis not present

## 2016-10-25 MED ORDER — ALBUTEROL SULFATE HFA 108 (90 BASE) MCG/ACT IN AERS: INHALATION_SPRAY | RESPIRATORY_TRACT | 99 refills | Status: DC

## 2016-10-25 MED ORDER — METHYLPREDNISOLONE ACETATE 80 MG/ML IJ SUSP
80.0000 mg | Freq: Once | INTRAMUSCULAR | Status: AC
  Administered 2016-10-25: 80 mg via INTRAMUSCULAR

## 2016-10-25 MED ORDER — LEVALBUTEROL HCL 0.63 MG/3ML IN NEBU
0.6300 mg | INHALATION_SOLUTION | Freq: Once | RESPIRATORY_TRACT | Status: AC
  Administered 2016-10-25: 0.63 mg via RESPIRATORY_TRACT

## 2016-10-25 MED ORDER — MONTELUKAST SODIUM 10 MG PO TABS: ORAL_TABLET | ORAL | 12 refills | Status: DC

## 2016-10-25 MED ORDER — UMECLIDINIUM-VILANTEROL 62.5-25 MCG/INH IN AEPB: 1.0000 | INHALATION_SPRAY | Freq: Every day | RESPIRATORY_TRACT | 12 refills | Status: DC

## 2016-10-25 NOTE — Progress Notes (Signed)
Patient ID: Kelly Butler, female    DOB: 05/23/1961, 56 y.o.   MRN: [id number]  HPI  F former smoker followed for Asthma, Allergic rhinitis, GERD, complicated by GERD, VCD, chronic headache, glaucoma, insomnia Office Spirometry 10/08/14- moderate restriction. FVC 1. for 3/60%, FEV1 1.22/61%, FEV1/FVC 0.85, FEF 25-75 percent 2.05/76%. Xolair -----------------------------------------------------------------------------------------------------------  05/14/2016-56 year old female former smoker followed for Asthma, allergic rhinitis, complicated by GERD, VCD, glaucom, chronic headaches, insomnia Xolair started 10/29/2015 FOLLOWS FOR:Pt continues Xolair injections without reactions; has noticed she continues to have SOB and gives out with lengthy exertion and conversations. She still notices easily dyspneic with exertion or prolonged conversations. Less wheeze on Xolair. Has used her nebulizer more in the last 2 weeks since weather got cold. No longer on Timoptic. Had been using both an oral and Spiriva-shouldn't need both and we would like to minimize the LAMA with glaucoma.  10/25/2016- 56 year old female former smoker followed for Asthma, allergic rhinitis, complicated by GERD, VCD, glaucoma, chronic headaches, insomnia Xolair started 10/29/2015 Trouble breathing for the past month. Notices it all the time, not just with walking. Semi-productive cough.   Increased shortness of breath with wheeze and cough, chest tightness 1 month roughly corresponding to pollen season. 2 weeks ago had an acute bronchitis with some fever and green sputum, which has resolved leaving her still with the original symptoms. Occasional chest pain diffusely across her chest and sometimes into the left shoulder, may have with exertion but also while lying supine. Last about 20 minutes or so. She does recognize active reflux symptoms despite omeprazole once daily. Denies palpitations or swelling feet. Using nebulizer about 4  times daily. Trelegy inhaler sample did help but is now used up will be too expensive to expect coverage by her insurance.  Review of Systems-see HPI Constitutional:      +weight gain, no-night sweats, fevers, chills, fatigue, lassitude. HEENT:   +  headaches,  No-difficulty swallowing, tooth/dental problems, sore throat,       No-  sneezing, itching, ear ache, +nasal congestion, post nasal drip,  CV:  No- chest pain,  No-orthopnea, PND, swelling in lower extremities, anasarca,  dizziness, palpitations Resp: + shortness of breath with exertion or at rest.           productive cough,  + non-productive cough,  No- coughing up of blood.              No-   change in color of mucus.  + wheezing.   Skin: No-   rash or lesions. GI: +   heartburn, indigestion, no-abdominal pain, nausea, vomiting,  GU:  MS:  +  joint pain or swelling.  Neuro-     nothing unusual Psych:  No- change in mood or affect. No depression or anxiety.  No memory loss.  Objective:   Physical Exam General- Alert, Oriented, Affect-appropriate, Distress- none acute,  +overweight (tight/ round habitus) Skin- rash-none, lesions- none, excoriation- none Lymphadenopathy- none Head- atraumatic            Eyes- Gross vision intact, PERRLA, conjunctivae clear secretions            Ears- Hearing, canals normal            Nose- Clear, No-Septal dev, mucus, polyps, erosion, perforation             Throat- Mallampati III-IV ,  drainage- none, tonsils- atrophic.  Neck- flexible , trachea midline, no stridor , thyroid nl, carotid no bruit Chest - symmetrical excursion ,  unlabored           Heart/CV- RRR , 1/6 SEM murmur , no gallop  , no rub, nl s1 s2                           - JVD- none , edema- none, stasis changes- none, varices- none           Lung- +distant, wheeze -None, cough- slight , dullness-none, rub- none           Chest wall-  Abd-  Br/ Gen/ Rectal- Not done, not indicated Extrem- cyanosis- none, clubbing, none,  atrophy- none, strength- nl Neuro- grossly intact to observation

## 2016-10-25 NOTE — Assessment & Plan Note (Signed)
Her body habitus favors obesity hypoventilation with restrictive physiology. Her belly is just too tight to allow a deep breath. This magnifies her dyspnea on exertion as discussed.

## 2016-10-25 NOTE — Assessment & Plan Note (Signed)
Exacerbation over the past month consistent with spring pollen season and a superimposed acute viral pattern bronchitis. The latter is now resolved. Plan-nebulizer treatments Xopenex, Depo-Medrol, refill asthma medications with discussion.

## 2016-10-25 NOTE — Patient Instructions (Signed)
Med refills sent  Order- neb xop 0.63     Dx asthma exacerbation             Depo 80  Continue the omeprazole 1 daily before breakfast   Try otc Pepcid/ famotadine  1 daily before supper  See if taking the two acid blockers helps control your acid reflux heart burn better  We will continue Xolair and montelukast/ Singulair

## 2016-10-25 NOTE — Assessment & Plan Note (Signed)
She describes her chest pain, I think reflux/heartburn is more likely explanation than cardiac pain. Plan-recommend she continue omeprazole once daily before breakfast but add Pepcid once daily before supper. Reflux precautions. If she doesn't significantly improve with this then recommend cardiology evaluation.

## 2016-10-26 MED ORDER — OMALIZUMAB 150 MG ~~LOC~~ SOLR
225.0000 mg | SUBCUTANEOUS | Status: DC
  Administered 2016-10-25: 225 mg via SUBCUTANEOUS

## 2016-10-26 NOTE — Progress Notes (Signed)
Xolair injection documentation and charges entered by Tangie Stay, RMA, based on injection sheet filled out by Tammy Scott per office protocol.   

## 2016-10-27 ENCOUNTER — Telehealth: Payer: Self-pay | Admitting: Internal Medicine

## 2016-10-27 ENCOUNTER — Encounter: Payer: Self-pay | Admitting: Internal Medicine

## 2016-10-27 NOTE — Telephone Encounter (Signed)
Spoke with the pt  She states had asthma flare yesterday while at work and had to leave early  She feels improved and wants to return to work today  She needs letter stating she was here for ov on 10/25/16 and also excusing her from early leave yesterday  Please advise, thanks!

## 2016-10-27 NOTE — Telephone Encounter (Signed)
Letter done

## 2016-10-27 NOTE — Telephone Encounter (Signed)
Pt aware that letter has been completed. Pt states she will come by within the next hour to pick letter up. Letter has been placed in brown folder for pick up. Nothing further needed.

## 2016-10-28 ENCOUNTER — Encounter: Payer: Self-pay | Admitting: Internal Medicine

## 2016-10-28 NOTE — Telephone Encounter (Signed)
I was wanting to know if Dr. [name] would still take into consideration my seeing a heart specialist due to the reason I am still have major chest pain along with shortness of breath. I experience the chest pain at least twice a day and it last up too 30 or 40 minutes even with rest some time it may travel to my left arm with a numbness.. I just want to elevated any other problems just to narrow it down to a specific cause.  Thanks   Kelly Butler      CY please advise on above message. thanks

## 2016-10-28 NOTE — Telephone Encounter (Signed)
From her description, and knowing she is aware of reflux, I felt GERD was a more likely explanation for her chest pain. If she is seeing her GI doctor and still having pain, then ok to refer to cardiology for chest pain evaluation.

## 2016-11-01 ENCOUNTER — Other Ambulatory Visit: Payer: Self-pay | Admitting: Internal Medicine

## 2016-11-02 NOTE — Telephone Encounter (Signed)
CY Pleas advise on refill. Thanks.

## 2016-11-02 NOTE — Telephone Encounter (Signed)
Ok to refill 6 months 

## 2016-11-05 ENCOUNTER — Ambulatory Visit: Payer: Medicaid Other

## 2016-11-09 ENCOUNTER — Telehealth: Payer: Self-pay | Admitting: Internal Medicine

## 2016-11-09 ENCOUNTER — Other Ambulatory Visit: Payer: Self-pay | Admitting: Internal Medicine

## 2016-11-09 ENCOUNTER — Ambulatory Visit: Payer: Medicaid Other | Attending: Orthopedic Surgery | Admitting: Physical Therapy

## 2016-11-09 MED ORDER — EPINEPHRINE 0.3 MG/0.3ML IJ SOAJ: INTRAMUSCULAR | 5 refills | Status: DC

## 2016-11-09 NOTE — Telephone Encounter (Signed)
Called [place] [facility] and did the 60 day PA for the generic epi pen.   The PA# [id number]  I have called and left the message on machine for the pt to make her aware.

## 2016-11-10 NOTE — Telephone Encounter (Signed)
CY Please advise on refill. Thanks.  

## 2016-11-11 ENCOUNTER — Ambulatory Visit: Payer: Medicaid Other

## 2016-11-11 NOTE — Telephone Encounter (Signed)
Called [facility] and spoke with Tomeka Verbal order given for the Ambien 10mg  refill for total of 6 refills at authorized by [facility] list updated Will sign off

## 2016-11-11 NOTE — Telephone Encounter (Signed)
Ok to refill total 6 months 

## 2016-11-12 ENCOUNTER — Ambulatory Visit (INDEPENDENT_AMBULATORY_CARE_PROVIDER_SITE_OTHER): Payer: Medicaid Other

## 2016-11-12 DIAGNOSIS — J4540 Moderate persistent asthma, uncomplicated: Secondary | ICD-10-CM | POA: Diagnosis not present

## 2016-11-15 MED ORDER — OMALIZUMAB 150 MG ~~LOC~~ SOLR
225.0000 mg | Freq: Once | SUBCUTANEOUS | Status: AC
  Administered 2016-11-12: 225 mg via SUBCUTANEOUS

## 2016-11-17 ENCOUNTER — Telehealth: Payer: Self-pay | Admitting: Internal Medicine

## 2016-11-17 NOTE — Telephone Encounter (Signed)
#   vials:4 Ordered date:11/17/16 Shipping Date:11/17/16

## 2016-11-18 NOTE — Telephone Encounter (Signed)
#   Vials:4 Arrival Date:11/18/16 Lot #:[id number] Exp Date:10/21

## 2016-11-23 ENCOUNTER — Other Ambulatory Visit: Payer: Self-pay | Admitting: Internal Medicine

## 2016-12-02 ENCOUNTER — Ambulatory Visit (INDEPENDENT_AMBULATORY_CARE_PROVIDER_SITE_OTHER): Payer: Medicaid Other

## 2016-12-02 DIAGNOSIS — J4540 Moderate persistent asthma, uncomplicated: Secondary | ICD-10-CM | POA: Diagnosis not present

## 2016-12-06 MED ORDER — OMALIZUMAB 150 MG ~~LOC~~ SOLR
225.0000 mg | Freq: Once | SUBCUTANEOUS | Status: AC
Start: 2016-12-02 — End: 2016-12-02
  Administered 2016-12-02: 225 mg via SUBCUTANEOUS

## 2016-12-07 ENCOUNTER — Ambulatory Visit
Admission: RE | Admit: 2016-12-07 | Discharge: 2016-12-07 | Disposition: A | Discharge status: Home / Self Care | Payer: Medicaid Other | Source: Ambulatory Visit | Attending: Internal Medicine | Admitting: Internal Medicine

## 2016-12-07 DIAGNOSIS — Z1231 Encounter for screening mammogram for malignant neoplasm of breast: Secondary | ICD-10-CM

## 2016-12-08 ENCOUNTER — Ambulatory Visit: Payer: Medicaid Other

## 2016-12-14 MED ORDER — OMALIZUMAB 150 MG ~~LOC~~ SOLR
225.0000 mg | Freq: Once | SUBCUTANEOUS | Status: AC
  Administered 2016-07-27: 225 mg via SUBCUTANEOUS

## 2016-12-17 ENCOUNTER — Ambulatory Visit: Payer: Medicaid Other

## 2016-12-20 ENCOUNTER — Ambulatory Visit (INDEPENDENT_AMBULATORY_CARE_PROVIDER_SITE_OTHER): Payer: Medicaid Other

## 2016-12-20 DIAGNOSIS — J4540 Moderate persistent asthma, uncomplicated: Secondary | ICD-10-CM

## 2016-12-22 MED ORDER — OMALIZUMAB 150 MG ~~LOC~~ SOLR
225.0000 mg | Freq: Once | SUBCUTANEOUS | Status: AC
  Administered 2016-12-20: 225 mg via SUBCUTANEOUS

## 2016-12-27 ENCOUNTER — Telehealth: Payer: Self-pay | Admitting: Internal Medicine

## 2016-12-27 NOTE — Telephone Encounter (Signed)
#   vials:4 Ordered date:12/27/16 Shipping Date: 12/29/16 Request del. 12/30/16

## 2016-12-28 ENCOUNTER — Other Ambulatory Visit: Payer: Self-pay | Admitting: Internal Medicine

## 2016-12-30 NOTE — Telephone Encounter (Signed)
#   Vials:4 Arrival Date:12/30/16 Lot #:[id number] Exp Date:11/21

## 2017-01-03 ENCOUNTER — Ambulatory Visit (INDEPENDENT_AMBULATORY_CARE_PROVIDER_SITE_OTHER): Payer: Medicaid Other

## 2017-01-03 ENCOUNTER — Telehealth: Payer: Self-pay | Admitting: Internal Medicine

## 2017-01-03 DIAGNOSIS — J4540 Moderate persistent asthma, uncomplicated: Secondary | ICD-10-CM | POA: Diagnosis not present

## 2017-01-03 NOTE — Telephone Encounter (Addendum)
Received a PA for the pts epi-pen.  Called the pharmacy and they stated that the pt has refilled this in April and in June and her insurance will not cover this medication to be refilled until August.  Pt can get 6 pens every 180 days.  I called and lmomtcb x 1 for the pt to see is she is needing this many refills of the epi pen. Pharmacy stated that her insurance will cover the mylan brand but it will still only cover the 6 pens in 180 days. I have lmomtcb x 1 for the pt.   [email or web address] Key: [id number] DOB:  [birth year]

## 2017-01-04 MED ORDER — OMALIZUMAB 150 MG ~~LOC~~ SOLR
225.0000 mg | SUBCUTANEOUS | Status: DC
  Administered 2017-01-03: 225 mg via SUBCUTANEOUS

## 2017-01-04 NOTE — Progress Notes (Signed)
Xolair injection documentation and charges entered by Ashley Caulfield, RMA, based on injection sheet filled out by Tammy Scott per office protocol.   

## 2017-01-19 ENCOUNTER — Ambulatory Visit: Payer: Medicaid Other

## 2017-01-20 ENCOUNTER — Ambulatory Visit (HOSPITAL_COMMUNITY)
Admission: EM | Admit: 2017-01-20 | Discharge: 2017-01-20 | Disposition: A | Discharge status: Home / Self Care | Payer: Medicaid Other | Attending: Family Medicine | Admitting: Family Medicine

## 2017-01-20 ENCOUNTER — Encounter (HOSPITAL_COMMUNITY): Payer: Self-pay

## 2017-01-20 DIAGNOSIS — M545 Low back pain, unspecified: Secondary | ICD-10-CM

## 2017-01-20 DIAGNOSIS — T148XXA Other injury of unspecified body region, initial encounter: Secondary | ICD-10-CM

## 2017-01-20 DIAGNOSIS — G8929 Other chronic pain: Secondary | ICD-10-CM | POA: Diagnosis not present

## 2017-01-20 DIAGNOSIS — S39012A Strain of muscle, fascia and tendon of lower back, initial encounter: Secondary | ICD-10-CM | POA: Diagnosis not present

## 2017-01-20 MED ORDER — METHOCARBAMOL 500 MG PO TABS: 500.0000 mg | ORAL_TABLET | Freq: Two times a day (BID) | ORAL | 0 refills | Status: DC

## 2017-01-20 MED ORDER — TRAMADOL HCL 50 MG PO TABS: 50.0000 mg | ORAL_TABLET | Freq: Four times a day (QID) | ORAL | 0 refills | Status: DC | PRN
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
## 2017-01-20 NOTE — Discharge Instructions (Signed)
Apply heat to the areas as often as possible, perform stretches as demonstrated. He may take the medication for pain but both of these medications can cause sedation or drowsiness. Your best help for this type of back pain is a type of physical therapy primarily with stretching heat and exercises.

## 2017-01-20 NOTE — ED Triage Notes (Signed)
Pt here for back pain that has been going on since the last time she was here in feb. Said it's been off and on and now it has gotten worse along with burning down the left side of her torso radiating down to her knee. Did take hydrocodone without relief.

## 2017-01-21 ENCOUNTER — Ambulatory Visit (INDEPENDENT_AMBULATORY_CARE_PROVIDER_SITE_OTHER): Payer: Medicaid Other

## 2017-01-21 DIAGNOSIS — J4540 Moderate persistent asthma, uncomplicated: Secondary | ICD-10-CM | POA: Diagnosis not present

## 2017-01-21 MED ORDER — EPINEPHRINE 0.3 MG/0.3ML IJ SOAJ: INTRAMUSCULAR | 5 refills | Status: DC

## 2017-01-21 NOTE — Telephone Encounter (Signed)
Patient came in for her xolair injection today and asked that I put a message back to see if an RX could be sent over for a generic Epi-Pen that Medicaid pays for.  States her pharmacy told her to use for an RX for this.  Pharm is [facility] [address] [place] is [phone].

## 2017-01-21 NOTE — Telephone Encounter (Signed)
Refill sent to the pharmacy. Nothing further is needed.

## 2017-01-24 MED ORDER — OMALIZUMAB 150 MG ~~LOC~~ SOLR
225.0000 mg | Freq: Once | SUBCUTANEOUS | Status: AC
Start: 2017-01-21 — End: 2017-01-21
  Administered 2017-01-21: 225 mg via SUBCUTANEOUS

## 2017-01-27 ENCOUNTER — Telehealth: Payer: Self-pay | Admitting: Internal Medicine

## 2017-01-27 NOTE — Telephone Encounter (Signed)
#   vials:4 Ordered date:01/27/17 Shipping Date:02/01/17

## 2017-02-02 NOTE — Telephone Encounter (Signed)
#   Vials:4 Arrival Date:02/02/17 Lot #:[id number] Exp Date:05/2020

## 2017-02-04 ENCOUNTER — Ambulatory Visit (INDEPENDENT_AMBULATORY_CARE_PROVIDER_SITE_OTHER): Payer: Medicaid Other

## 2017-02-04 DIAGNOSIS — J4540 Moderate persistent asthma, uncomplicated: Secondary | ICD-10-CM

## 2017-02-10 MED ORDER — OMALIZUMAB 150 MG ~~LOC~~ SOLR
225.0000 mg | Freq: Once | SUBCUTANEOUS | Status: AC
  Administered 2017-02-04: 225 mg via SUBCUTANEOUS

## 2017-02-22 ENCOUNTER — Ambulatory Visit: Payer: Medicaid Other

## 2017-02-23 ENCOUNTER — Telehealth: Payer: Self-pay | Admitting: Internal Medicine

## 2017-02-23 NOTE — Telephone Encounter (Signed)
ATC pt x2 - line busy both times.  WCB. 

## 2017-02-24 ENCOUNTER — Encounter (HOSPITAL_COMMUNITY): Payer: Self-pay | Admitting: Emergency Medicine

## 2017-02-24 ENCOUNTER — Ambulatory Visit (HOSPITAL_COMMUNITY)
Admission: EM | Admit: 2017-02-24 | Discharge: 2017-02-24 | Disposition: A | Discharge status: Nursing Home (Includes ICF) | Payer: Medicaid Other | Attending: Physician Assistant | Admitting: Physician Assistant

## 2017-02-24 ENCOUNTER — Encounter (HOSPITAL_COMMUNITY): Payer: Self-pay | Admitting: Nurse Practitioner

## 2017-02-24 ENCOUNTER — Emergency Department (HOSPITAL_COMMUNITY): Payer: Medicaid Other

## 2017-02-24 ENCOUNTER — Emergency Department (HOSPITAL_COMMUNITY)
Admission: EM | Admit: 2017-02-24 | Discharge: 2017-02-25 | Disposition: A | Discharge status: Home / Self Care | Payer: Medicaid Other | Attending: Emergency Medicine | Admitting: Emergency Medicine

## 2017-02-24 DIAGNOSIS — J4541 Moderate persistent asthma with (acute) exacerbation: Secondary | ICD-10-CM

## 2017-02-24 DIAGNOSIS — R0602 Shortness of breath: Secondary | ICD-10-CM | POA: Diagnosis not present

## 2017-02-24 DIAGNOSIS — R0682 Tachypnea, not elsewhere classified: Secondary | ICD-10-CM | POA: Diagnosis not present

## 2017-02-24 DIAGNOSIS — R05 Cough: Secondary | ICD-10-CM

## 2017-02-24 DIAGNOSIS — Z87891 Personal history of nicotine dependence: Secondary | ICD-10-CM | POA: Insufficient documentation

## 2017-02-24 DIAGNOSIS — J189 Pneumonia, unspecified organism: Secondary | ICD-10-CM | POA: Diagnosis not present

## 2017-02-24 DIAGNOSIS — Z79899 Other long term (current) drug therapy: Secondary | ICD-10-CM | POA: Diagnosis not present

## 2017-02-24 DIAGNOSIS — R0989 Other specified symptoms and signs involving the circulatory and respiratory systems: Secondary | ICD-10-CM | POA: Diagnosis not present

## 2017-02-24 DIAGNOSIS — I10 Essential (primary) hypertension: Secondary | ICD-10-CM

## 2017-02-24 DIAGNOSIS — R0603 Acute respiratory distress: Secondary | ICD-10-CM

## 2017-02-24 DIAGNOSIS — R0689 Other abnormalities of breathing: Secondary | ICD-10-CM

## 2017-02-24 LAB — BASIC METABOLIC PANEL
ANION GAP: 11 (ref 5–15)
BUN: 6 mg/dL (ref 6–20)
CALCIUM: 8.8 mg/dL — AB (ref 8.9–10.3)
CHLORIDE: 98 mmol/L — AB (ref 101–111)
CO2: 27 mmol/L (ref 22–32)
CREATININE: 0.72 mg/dL (ref 0.44–1.00)
GFR calc non Af Amer: >60 mL/min (ref >60)
Glucose, Bld: 140 mg/dL — ABNORMAL HIGH (ref 65–99)
Potassium: 3.4 mmol/L — ABNORMAL LOW (ref 3.5–5.1)
SODIUM: 136 mmol/L (ref 135–145)

## 2017-02-24 LAB — CBC WITH DIFFERENTIAL/PLATELET
BASOS ABS: 0 10*3/uL (ref 0.0–0.1)
BASOS PCT: 0 %
EOS ABS: 0.2 10*3/uL (ref 0.0–0.7)
Eosinophils Relative: 1 %
HEMATOCRIT: 33.3 % — AB (ref 36.0–46.0)
HEMOGLOBIN: 10.9 g/dL — AB (ref 12.0–15.0)
Lymphocytes Relative: 14 %
Lymphs Abs: 2 10*3/uL (ref 0.7–4.0)
MCH: 27.4 pg (ref 26.0–34.0)
MCHC: 32.7 g/dL (ref 30.0–36.0)
MCV: 83.7 fL (ref 78.0–100.0)
MONOS PCT: 2 %
Monocytes Absolute: 0.2 10*3/uL (ref 0.1–1.0)
NEUTROS ABS: 11.6 10*3/uL — AB (ref 1.7–7.7)
NEUTROS PCT: 83 %
Platelets: 284 10*3/uL (ref 150–400)
RBC: 3.98 MIL/uL (ref 3.87–5.11)
RDW: 14.7 % (ref 11.5–15.5)
WBC: 14 10*3/uL — ABNORMAL HIGH (ref 4.0–10.5)

## 2017-02-24 LAB — I-STAT TROPONIN, ED: TROPONIN I, POC: 0.01 ng/mL (ref 0.00–0.08)

## 2017-02-24 LAB — BRAIN NATRIURETIC PEPTIDE: B NATRIURETIC PEPTIDE 5: 45.3 pg/mL (ref 0.0–100.0)

## 2017-02-24 MED ORDER — IPRATROPIUM-ALBUTEROL 0.5-2.5 (3) MG/3ML IN SOLN: 3.0000 mL | Freq: Once | RESPIRATORY_TRACT | Status: DC

## 2017-02-24 MED ORDER — METHYLPREDNISOLONE SODIUM SUCC 125 MG IJ SOLR
INTRAMUSCULAR | Status: AC
  Filled 2017-02-24: qty 2

## 2017-02-24 MED ORDER — ENALAPRIL MALEATE 5 MG PO TABS
5.0000 mg | ORAL_TABLET | Freq: Every evening | ORAL | Status: DC
  Filled 2017-02-24: qty 1

## 2017-02-24 MED ORDER — IPRATROPIUM-ALBUTEROL 0.5-2.5 (3) MG/3ML IN SOLN
RESPIRATORY_TRACT | Status: AC
  Filled 2017-02-24: qty 3

## 2017-02-24 MED ORDER — IPRATROPIUM-ALBUTEROL 0.5-2.5 (3) MG/3ML IN SOLN
3.0000 mL | Freq: Once | RESPIRATORY_TRACT | Status: AC
  Administered 2017-02-24: 3 mL via RESPIRATORY_TRACT
  Filled 2017-02-24: qty 3

## 2017-02-24 MED ORDER — IPRATROPIUM-ALBUTEROL 0.5-2.5 (3) MG/3ML IN SOLN
3.0000 mL | Freq: Once | RESPIRATORY_TRACT | Status: AC
Start: 2017-02-24 — End: 2017-02-24
  Administered 2017-02-24: 3 mL via RESPIRATORY_TRACT

## 2017-02-24 MED ORDER — METHYLPREDNISOLONE SODIUM SUCC 125 MG IJ SOLR
125.0000 mg | Freq: Once | INTRAMUSCULAR | Status: AC
  Administered 2017-02-24: 125 mg via INTRAVENOUS

## 2017-02-24 MED ORDER — HYDROCHLOROTHIAZIDE 25 MG PO TABS
25.0000 mg | ORAL_TABLET | Freq: Two times a day (BID) | ORAL | Status: DC
  Administered 2017-02-25: 25 mg via ORAL
  Filled 2017-02-24: qty 1

## 2017-02-24 MED ORDER — ENALAPRIL MALEATE 5 MG PO TABS
5.0000 mg | ORAL_TABLET | Freq: Once | ORAL | Status: AC
  Administered 2017-02-25: 5 mg via ORAL
  Filled 2017-02-24: qty 1

## 2017-02-24 NOTE — Telephone Encounter (Signed)
Attempted to contact pt. Line rang busy x3. Will try back.

## 2017-02-24 NOTE — ED Triage Notes (Signed)
Pt presents with c/o asthma exacerbation. Her symptoms began about three days ago. She reports productive cough with clear sputum. She denies fevers. shes tried her nebulizers at home with no relief

## 2017-02-24 NOTE — Telephone Encounter (Signed)
Attempted to call the pt back but the line is still ringing busy.

## 2017-02-24 NOTE — ED Triage Notes (Signed)
Arrives via EMS from urgent care for shob and cp. SHOB started three days ago, cp onset today. Room air sat 92% on arrival. Received 125MG  solumedrol and a duoneb at urgent care.

## 2017-02-24 NOTE — Telephone Encounter (Signed)
Offer prednisone 10 mg, # 20, 4 X 2 DAYS, 3 X 2 DAYS, 2 X 2 DAYS, 1 X 2 DAYS  

## 2017-02-24 NOTE — ED Provider Notes (Signed)
02/24/2017 8:08 PM   DOB: [birth year] / MRN: [id number]  SUBJECTIVE:  Kelly Butler is a 56 y.o. female presenting for SOB that started about three days ago.  She associates facial swelling that started about 2-3 hours ago.  Feels that the SOB is getting worse.  Has tried a nebulizer (most recent 3 pm) and her inhaler without relief.  She has been hospitalized for asthma in the past.   She is allergic to celebrex [celecoxib]; pregabalin; and sulfa antibiotics.   She  has a past medical history of Abnormal heart rhythm; Allergic rhinitis; Asthma; Disorder of vocal cord; Fibromyalgia; GERD (gastroesophageal reflux disease); Hypertension; and Sleep apnea.    She  reports that she quit smoking about 27 years ago. She has a 1.80 pack-year smoking history. She has never used smokeless tobacco. She reports that she does not drink alcohol or use drugs. She  reports that she does not engage in sexual activity. The patient  has a past surgical history that includes Knee surgery; Shoulder surgery (2003); Carpal tunnel release; Appendectomy; and Skin graft.  Her family history includes Asthma in her mother and son; Breast cancer in her mother; Coronary artery disease in her father and mother; Depression in her mother; Diabetes in her brother; Hypertension in her brother, father, and mother; Stroke in her father and maternal grandfather.  Review of Systems  Constitutional: Positive for chills. Negative for fever.  Respiratory: Positive for cough, sputum production, shortness of breath and wheezing. Negative for hemoptysis.   Neurological: Negative for dizziness.    OBJECTIVE:  BP (!) 147/82   Pulse (!) 106   Temp 98.4 F (36.9 C) (Oral)   Resp (!) 26   LMP 07/21/2016   SpO2 90%   No results found for: HGBA1C   Physical Exam  Constitutional: She appears well-nourished. She is active. She appears distressed.  HENT:  Right Ear: Hearing, tympanic membrane, external ear and ear canal normal.  Left Ear:  Hearing, tympanic membrane, external ear and ear canal normal.  Nose: Nose normal. Right sinus exhibits no maxillary sinus tenderness and no frontal sinus tenderness. Left sinus exhibits no maxillary sinus tenderness and no frontal sinus tenderness.  Mouth/Throat: Uvula is midline, oropharynx is clear and moist and mucous membranes are normal. Mucous membranes are not dry. No oropharyngeal exudate, posterior oropharyngeal edema or tonsillar abscesses.  She exhibits generalized swelling about the eyes and lips, possibly physiologic.   Cardiovascular:  No extrasystoles are present. Tachycardia present.  Exam reveals no gallop and no friction rub.   No murmur heard. Pulmonary/Chest: No tachypnea. She is in respiratory distress.  Decreased breath sounds in all lung fields.  Lymphadenopathy:       Head (right side): No submandibular and no tonsillar adenopathy present.       Head (left side): No submandibular and no tonsillar adenopathy present.    She has no cervical adenopathy.  Neurological: She is alert.  Skin: Skin is warm. She is not diaphoretic.    No results found for this or any previous visit (from the past 72 hour(s)).  No results found.  ASSESSMENT AND PLAN:  The primary encounter diagnosis was Tachypnea. A diagnosis of Decreased breath sounds was also pertinent to this visit. We've provided one round of duoneb here, IV solumedrol 125 O2 at 8 liters per minute, and started an IV in the right AC.    The patient is advised to call or return to clinic if she does not see an improvement  in symptoms, or to seek the care of the closest emergency department if she worsens with the above plan.   Philis Fendt, MHS, PA-C 02/24/2017 8:08 PM    Tereasa Coop, PA-C 02/24/17 [phone]
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
## 2017-02-24 NOTE — Telephone Encounter (Signed)
Patient called back stating she has been sent home from work for asthma attack, requesting appt with CY today or tomorrow - nothing on schedule.  Please call on cell [phone] (states having problems with home phone).

## 2017-02-24 NOTE — Telephone Encounter (Signed)
Spoke with patient. She stated that she was sent home from work due to her symptoms. She is still having increased SOB and wheezing. She wishes to be seen by CY. CY does not have any appts available at the moment.   [facility] [address].   CY, please advise. Thanks!

## 2017-02-24 NOTE — ED Notes (Signed)
Walked pt in room, pt O2 sats were 93 the whole time but dropped to 89 one time.

## 2017-02-25 ENCOUNTER — Encounter: Payer: Self-pay | Admitting: Internal Medicine

## 2017-02-25 ENCOUNTER — Ambulatory Visit (INDEPENDENT_AMBULATORY_CARE_PROVIDER_SITE_OTHER): Payer: Medicaid Other | Admitting: Internal Medicine

## 2017-02-25 ENCOUNTER — Ambulatory Visit: Payer: Medicaid Other

## 2017-02-25 ENCOUNTER — Encounter: Payer: Self-pay | Admitting: *Deleted

## 2017-02-25 DIAGNOSIS — J4540 Moderate persistent asthma, uncomplicated: Secondary | ICD-10-CM

## 2017-02-25 DIAGNOSIS — E662 Morbid (severe) obesity with alveolar hypoventilation: Secondary | ICD-10-CM

## 2017-02-25 MED ORDER — PREDNISONE 50 MG PO TABS: 50.0000 mg | ORAL_TABLET | Freq: Every day | ORAL | 0 refills | Status: DC

## 2017-02-25 MED ORDER — DOXYCYCLINE HYCLATE 100 MG PO CAPS: 100.0000 mg | ORAL_CAPSULE | Freq: Two times a day (BID) | ORAL | 0 refills | Status: DC

## 2017-02-25 NOTE — Discharge Instructions (Signed)
Take the antibiotics as prescribed, follow-up with your primary care doctor in the next couple of days to make sure you're improving, return as needed for worsening symptoms
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
## 2017-02-25 NOTE — Telephone Encounter (Signed)
ATC PT x3- Received busy dial.

## 2017-02-25 NOTE — ED Notes (Signed)
Patient left at this time with all belongings. Security escorted pt to her car.

## 2017-02-25 NOTE — Patient Instructions (Addendum)
Skip Xolair this time  Keep October appointment- Call earlier if needed  Take the doxycycline and prednisone as prescribed by ED  Work note for today  Ask at drug store for [place] magnesium and potassium supplement. Take one a day for 4-5 days, to raise your potassium level a little.

## 2017-02-27 NOTE — Assessment & Plan Note (Signed)
Body habitus strongly favors this diagnosis as a contributor to her dyspnea. Weight loss and bariatric counseling advised.

## 2017-02-27 NOTE — Assessment & Plan Note (Signed)
Recent exacerbation could be viral or allergic. Radiology interpreted chest x-ray on 823 as likely pulmonary edema but BNP is not elevated Plan-take doxycycline and prednisone prescribed by ED. Skips Xolair this time.

## 2017-02-28 ENCOUNTER — Ambulatory Visit: Payer: Medicaid Other | Admitting: Internal Medicine

## 2017-02-28 NOTE — Telephone Encounter (Signed)
Line busy w/cb

## 2017-03-01 NOTE — Telephone Encounter (Signed)
Will close this message since the pt was seen by CY on 02/25/17.

## 2017-03-02 ENCOUNTER — Telehealth: Payer: Self-pay | Admitting: Internal Medicine

## 2017-03-02 NOTE — Telephone Encounter (Signed)
Letter printed and placed on CY cart to sign.

## 2017-03-02 NOTE — Telephone Encounter (Signed)
Pt requesting a work excuse letter for this week - missed days from work, 02/28/17. Pt has gone back to work as of 03/01/17 (full time) but requests the letter state that she needs to only work 6 hours per day instead of 9.5 hours until she is fully recuperated. Pt having more SOB and chest pain.  Please advise Dr [name]. Thanks.

## 2017-03-02 NOTE — Telephone Encounter (Signed)
Ok - could you say she is under our care Needs to work shorter hours- 6 hours/ day, until (specify date of our next scheduled appointment) or further notice.  Thanks

## 2017-03-02 NOTE — Telephone Encounter (Signed)
LM for patient to make aware that letter is done.  Do we need to mail it or is she picking this up?  Letter is in triage hanging on white board in the meantime.

## 2017-03-02 NOTE — Telephone Encounter (Signed)
done

## 2017-03-03 NOTE — Telephone Encounter (Signed)
Pt aware that work excuse letter is ready for pick up.  Placed up front.  Nothing further needed.

## 2017-03-04 ENCOUNTER — Ambulatory Visit (INDEPENDENT_AMBULATORY_CARE_PROVIDER_SITE_OTHER): Payer: Medicaid Other

## 2017-03-04 DIAGNOSIS — Z23 Encounter for immunization: Secondary | ICD-10-CM

## 2017-03-04 DIAGNOSIS — J4540 Moderate persistent asthma, uncomplicated: Secondary | ICD-10-CM | POA: Diagnosis not present

## 2017-03-06 ENCOUNTER — Other Ambulatory Visit: Payer: Self-pay | Admitting: Obstetrics

## 2017-03-06 DIAGNOSIS — B9689 Other specified bacterial agents as the cause of diseases classified elsewhere: Secondary | ICD-10-CM

## 2017-03-06 DIAGNOSIS — N760 Acute vaginitis: Principal | ICD-10-CM

## 2017-03-08 ENCOUNTER — Telehealth: Payer: Self-pay | Admitting: Internal Medicine

## 2017-03-08 MED ORDER — OMALIZUMAB 150 MG ~~LOC~~ SOLR
225.0000 mg | SUBCUTANEOUS | Status: DC
  Administered 2017-03-04: 225 mg via SUBCUTANEOUS

## 2017-03-08 NOTE — Progress Notes (Signed)
Documentation of medication administration and charges of the Influenza vaccine have been completed by Desmond Dike, CMA based on the Influenza vaccine documentation sheet completed by [facility].

## 2017-03-08 NOTE — Telephone Encounter (Signed)
#   vials:4 Ordered date: 03/08/17 Shipping Date: 03/14/17

## 2017-03-08 NOTE — Progress Notes (Signed)
Documentation of medication administration and charges of Xolair have been completed by Veronica Guerrant, CMA based on the Xolair documentation sheet completed by Tammy Scott.  

## 2017-03-15 NOTE — Telephone Encounter (Signed)
#   Vials:4 Arrival Date:03/15/17 Lot #:[id number] Exp Date:05/2020

## 2017-03-21 ENCOUNTER — Encounter: Payer: Self-pay | Admitting: *Deleted

## 2017-03-21 ENCOUNTER — Ambulatory Visit (INDEPENDENT_AMBULATORY_CARE_PROVIDER_SITE_OTHER): Payer: Medicaid Other | Admitting: Internal Medicine

## 2017-03-21 ENCOUNTER — Encounter: Payer: Self-pay | Admitting: Internal Medicine

## 2017-03-21 VITALS — BP 128/80 | HR 109 | Ht 60.0 in | Wt 214.6 lb

## 2017-03-21 DIAGNOSIS — J4551 Severe persistent asthma with (acute) exacerbation: Secondary | ICD-10-CM

## 2017-03-21 DIAGNOSIS — J4540 Moderate persistent asthma, uncomplicated: Secondary | ICD-10-CM

## 2017-03-21 DIAGNOSIS — E662 Morbid (severe) obesity with alveolar hypoventilation: Secondary | ICD-10-CM

## 2017-03-21 MED ORDER — METHYLPREDNISOLONE ACETATE 80 MG/ML IJ SUSP
80.0000 mg | Freq: Once | INTRAMUSCULAR | Status: AC
  Administered 2017-03-21: 80 mg via INTRAMUSCULAR

## 2017-03-21 MED ORDER — LEVALBUTEROL HCL 0.63 MG/3ML IN NEBU
0.6300 mg | INHALATION_SOLUTION | Freq: Once | RESPIRATORY_TRACT | Status: AC
  Administered 2017-03-21: 0.63 mg via RESPIRATORY_TRACT

## 2017-03-21 MED ORDER — PREDNISONE 10 MG PO TABS: ORAL_TABLET | ORAL | 0 refills | Status: DC
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
## 2017-03-21 NOTE — Assessment & Plan Note (Signed)
Nonspecific exacerbation. Available medications discussed. Potential triggers reviewed. Plan-leave of absence 2 days. Nebulizer treatment with Xopenex twice. Depo-Medrol, prednisone 80 taper. Early return for follow-up and reassessment of oxygen needs.

## 2017-03-21 NOTE — Assessment & Plan Note (Signed)
Body habitus makes this diagnosis highly probable. She has not been able to make a significant improvement.

## 2017-03-21 NOTE — Patient Instructions (Addendum)
Neb xoip 0.63 x 2       Asthma exacerbation  Depo 80  Script for prednisone taper sent  Schedule O2 qualifying walk test on return in one week  Office spirometry if able  Work note for 9/17  And 9/18

## 2017-03-28 ENCOUNTER — Ambulatory Visit (INDEPENDENT_AMBULATORY_CARE_PROVIDER_SITE_OTHER): Payer: Medicaid Other | Admitting: Internal Medicine

## 2017-03-28 ENCOUNTER — Ambulatory Visit (INDEPENDENT_AMBULATORY_CARE_PROVIDER_SITE_OTHER)
Admission: RE | Admit: 2017-03-28 | Discharge: 2017-03-28 | Disposition: A | Discharge status: Home / Self Care | Payer: Medicaid Other | Source: Ambulatory Visit | Attending: Internal Medicine | Admitting: Internal Medicine

## 2017-03-28 ENCOUNTER — Encounter: Payer: Self-pay | Admitting: Internal Medicine

## 2017-03-28 ENCOUNTER — Telehealth: Payer: Self-pay | Admitting: Internal Medicine

## 2017-03-28 VITALS — BP 120/80 | HR 91 | Ht 60.0 in | Wt 203.2 lb

## 2017-03-28 DIAGNOSIS — J810 Acute pulmonary edema: Secondary | ICD-10-CM

## 2017-03-28 DIAGNOSIS — K219 Gastro-esophageal reflux disease without esophagitis: Secondary | ICD-10-CM | POA: Diagnosis not present

## 2017-03-28 DIAGNOSIS — R0602 Shortness of breath: Secondary | ICD-10-CM

## 2017-03-28 DIAGNOSIS — J4540 Moderate persistent asthma, uncomplicated: Secondary | ICD-10-CM

## 2017-03-28 DIAGNOSIS — J4550 Severe persistent asthma, uncomplicated: Secondary | ICD-10-CM | POA: Diagnosis not present

## 2017-03-28 MED ORDER — ARFORMOTEROL TARTRATE 15 MCG/2ML IN NEBU
15.0000 ug | INHALATION_SOLUTION | Freq: Once | RESPIRATORY_TRACT | Status: AC
  Administered 2017-03-28: 15 ug via RESPIRATORY_TRACT

## 2017-03-28 NOTE — Assessment & Plan Note (Signed)
Cardiomegaly, persistent Plan-echocardiogram
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
## 2017-03-28 NOTE — Telephone Encounter (Signed)
Pt c/o worsening sob, worsening chest pain, nonprod cough worsening since being seen this morning by CY.  Describes the CP as a "sharp pressure worse with inhalation".  Pt has used rescue inhaler since leaving office today, which did not help with s/s.  Requesting further recs.   [facility] [address].    CY please advise.  Thanks.

## 2017-03-28 NOTE — Assessment & Plan Note (Signed)
Esophageal dysfunction may explain some of her substernal discomforts. Reflux precautions were reemphasized.

## 2017-03-28 NOTE — Patient Instructions (Signed)
Order- schedule PFT    Dx asthmatic bronchitis mode severe             CXR             Echocardiogram-   Dyspnea, pulmonary edema  Neb treatment -  [place]      Dx as bronchitis mod severe

## 2017-03-28 NOTE — Telephone Encounter (Signed)
Spoke with pt, advised of CY's message. Pt understood and nothing further is needed.

## 2017-03-28 NOTE — Assessment & Plan Note (Addendum)
There is a component of obesity hypoventilation. Persistent parenchymal densities on imaging suggested by radiologist to be inflammatory or edema with possible scarring Plan-full PFT to measure lung volumes, update CXR for comparison, neb treatment with Perforomist

## 2017-03-28 NOTE — Telephone Encounter (Signed)
Try rescue inhaler or neb treatment.  Try a liquid coating antacid like Maalox or Mylanta.  If no relief, may need to go to ER

## 2017-04-04 ENCOUNTER — Telehealth: Payer: Self-pay | Admitting: Internal Medicine

## 2017-04-04 ENCOUNTER — Other Ambulatory Visit (HOSPITAL_COMMUNITY): Payer: Medicaid Other

## 2017-04-04 NOTE — Telephone Encounter (Signed)
Pt have ADA forms to be completed advised pt to take the forms to [facility] at [address], pt refused to take down information, request the nurse to contact her with the information...ert

## 2017-04-04 NOTE — Telephone Encounter (Signed)
Spoke with pt, advised her of office protocol for disability forms.  Provided [employer] address, phone and fax #'s.  Nothing further needed.

## 2017-04-05 ENCOUNTER — Telehealth: Payer: Self-pay | Admitting: Internal Medicine

## 2017-04-05 NOTE — Telephone Encounter (Signed)
Spoke with patient. Advised her that I have received the forms. Called patient to verify the date on the forms. Form states that the documentation must be received no than January 2018. Patient stated that they just need documentation dated after that date.   Patient has been advised multiple times of the correct process regarding FMLA forms. She refuses to go through this process.   Will route this to CY.

## 2017-04-06 ENCOUNTER — Other Ambulatory Visit: Payer: Self-pay | Admitting: Internal Medicine

## 2017-04-08 ENCOUNTER — Ambulatory Visit (HOSPITAL_COMMUNITY): Payer: Medicaid Other | Attending: Cardiovascular Disease

## 2017-04-08 ENCOUNTER — Other Ambulatory Visit: Payer: Self-pay

## 2017-04-08 DIAGNOSIS — M545 Low back pain: Secondary | ICD-10-CM | POA: Insufficient documentation

## 2017-04-08 DIAGNOSIS — J45909 Unspecified asthma, uncomplicated: Secondary | ICD-10-CM | POA: Diagnosis not present

## 2017-04-08 DIAGNOSIS — J810 Acute pulmonary edema: Secondary | ICD-10-CM | POA: Insufficient documentation

## 2017-04-08 DIAGNOSIS — R0602 Shortness of breath: Secondary | ICD-10-CM | POA: Diagnosis present

## 2017-04-08 DIAGNOSIS — E669 Obesity, unspecified: Secondary | ICD-10-CM | POA: Insufficient documentation

## 2017-04-08 DIAGNOSIS — I083 Combined rheumatic disorders of mitral, aortic and tricuspid valves: Secondary | ICD-10-CM | POA: Insufficient documentation

## 2017-04-08 NOTE — Telephone Encounter (Signed)
Spoke with pt, advised message from CY. Pt understood and nothing further is needed.  

## 2017-04-08 NOTE — Telephone Encounter (Signed)
CY - please advise if these forms have been completed. Thanks! 

## 2017-04-08 NOTE — Telephone Encounter (Signed)
Not yet

## 2017-04-18 ENCOUNTER — Ambulatory Visit (INDEPENDENT_AMBULATORY_CARE_PROVIDER_SITE_OTHER): Payer: Medicaid Other | Admitting: Internal Medicine

## 2017-04-18 DIAGNOSIS — J4550 Severe persistent asthma, uncomplicated: Secondary | ICD-10-CM

## 2017-04-18 LAB — PULMONARY FUNCTION TEST
DL/VA % pred: 169 %
DL/VA: 7.46 ml/min/mmHg/L
DLCO COR % PRED: 72 %
DLCO cor: 14.75 ml/min/mmHg
DLCO unc % pred: 68 %
DLCO unc: 13.86 ml/min/mmHg
FEF 25-75 POST: 2.04 L/s
FEF 25-75 Pre: 1.89 L/sec
FEF2575-%Change-Post: 8 %
FEF2575-%PRED-POST: 98 %
FEF2575-%PRED-PRE: 90 %
FEV1-%Change-Post: 1 %
FEV1-%PRED-POST: 42 %
FEV1-%Pred-Pre: 42 %
FEV1-Post: 0.84 L
FEV1-Pre: 0.83 L
FEV1FVC-%Change-Post: 1 %
FEV1FVC-%PRED-PRE: 117 %
FEV6-%CHANGE-POST: 0 %
FEV6-%Pred-Post: 36 %
FEV6-%Pred-Pre: 36 %
FEV6-Post: 0.87 L
FEV6-Pre: 0.87 L
FEV6FVC-%PRED-POST: 103 %
FEV6FVC-%Pred-Pre: 103 %
FVC-%CHANGE-POST: 0 %
FVC-%PRED-POST: 35 %
FVC-%PRED-PRE: 35 %
FVC-POST: 0.87 L
FVC-PRE: 0.87 L
POST FEV6/FVC RATIO: 100 %
PRE FEV1/FVC RATIO: 96 %
Post FEV1/FVC ratio: 97 %
Pre FEV6/FVC Ratio: 100 %
RV % pred: 71 %
RV: 1.23 L
TLC % PRED: 51 %
TLC: 2.35 L

## 2017-04-18 NOTE — Progress Notes (Signed)
PFT completed today 04/18/17.  

## 2017-04-26 ENCOUNTER — Encounter: Payer: Self-pay | Admitting: Internal Medicine

## 2017-04-26 ENCOUNTER — Ambulatory Visit (INDEPENDENT_AMBULATORY_CARE_PROVIDER_SITE_OTHER): Payer: Medicaid Other | Admitting: Internal Medicine

## 2017-04-26 VITALS — BP 126/80 | HR 90 | Ht 60.0 in | Wt 209.6 lb

## 2017-04-26 DIAGNOSIS — K219 Gastro-esophageal reflux disease without esophagitis: Secondary | ICD-10-CM

## 2017-04-26 DIAGNOSIS — G4733 Obstructive sleep apnea (adult) (pediatric): Secondary | ICD-10-CM

## 2017-04-26 DIAGNOSIS — I517 Cardiomegaly: Secondary | ICD-10-CM | POA: Diagnosis not present

## 2017-04-26 DIAGNOSIS — E662 Morbid (severe) obesity with alveolar hypoventilation: Secondary | ICD-10-CM

## 2017-04-26 DIAGNOSIS — R931 Abnormal findings on diagnostic imaging of heart and coronary circulation: Secondary | ICD-10-CM | POA: Diagnosis not present

## 2017-04-26 DIAGNOSIS — J4540 Moderate persistent asthma, uncomplicated: Secondary | ICD-10-CM | POA: Diagnosis not present

## 2017-04-26 NOTE — Patient Instructions (Signed)
D/C Xolair shots  Order- schedule Home Sleep Test    Dx OSA  Order- referral to cardiology    Dx abnormal echocardiogram, dyspnea on exertion

## 2017-04-26 NOTE — Assessment & Plan Note (Signed)
She still admits to symptoms of daytime sleepiness, smothering and sleep and witnessed snoring to suggest obstructive sleep apnea may be more important now. Plan-update sleep study. Her insurance requires an attended overnight study.

## 2017-04-26 NOTE — Assessment & Plan Note (Signed)
PFT and physical exam are consistent with obesity hypoventilation syndrome. Lung function restricted by chest wall restriction due to her obesity. Plan-weight loss emphasized

## 2017-04-26 NOTE — Assessment & Plan Note (Signed)
Cardiomyopathy and grade 2 diastolic dysfunction contributing to her obesity and deconditioning as causes of dyspnea on exertion. Plan-refer to cardiology to see if they feel have anything to offer her.

## 2017-04-26 NOTE — Assessment & Plan Note (Signed)
Episode of respiratory distress while awake, lying down suggest possibility of reflux. She continues omeprazole and denies recognizing reflux or heartburn. Plan-elevation of head of bed and reflux precautions emphasized.

## 2017-04-26 NOTE — Assessment & Plan Note (Signed)
PFTs continue to show a primarily restrictive functional defect. This may include a component of air trapping by lung volumes measured low. Xolair has not been helpful. Bronchodilators only seemed to help occasionally. Plan-DC Xolair
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
## 2017-04-29 ENCOUNTER — Telehealth: Payer: Self-pay

## 2017-04-29 NOTE — Telephone Encounter (Signed)
PA for Epi pen received from pharmacy. I called the pharmacy to make sure they ran it for the generic brand. The pharmacist advised me that she has reached her limit with Medicaid on this medication. They only allow 6 every 180 days. I advised pt and she understood that she would have to pay for any additional epi-pen.

## 2017-05-04 ENCOUNTER — Other Ambulatory Visit: Payer: Self-pay | Admitting: Internal Medicine

## 2017-05-04 NOTE — Telephone Encounter (Signed)
Ok to refill x 6 months 
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
## 2017-05-04 NOTE — Telephone Encounter (Signed)
CY please advise on refill. Thanks. 

## 2017-05-08 ENCOUNTER — Other Ambulatory Visit: Payer: Self-pay | Admitting: Internal Medicine

## 2017-05-09 ENCOUNTER — Ambulatory Visit (INDEPENDENT_AMBULATORY_CARE_PROVIDER_SITE_OTHER): Payer: Medicaid Other | Admitting: Cardiovascular Disease

## 2017-05-09 ENCOUNTER — Encounter: Payer: Self-pay | Admitting: Cardiovascular Disease

## 2017-05-09 VITALS — BP 164/102 | HR 97 | Ht 60.0 in | Wt 203.0 lb

## 2017-05-09 DIAGNOSIS — Z79899 Other long term (current) drug therapy: Secondary | ICD-10-CM

## 2017-05-09 DIAGNOSIS — I509 Heart failure, unspecified: Secondary | ICD-10-CM | POA: Diagnosis not present

## 2017-05-09 DIAGNOSIS — R600 Localized edema: Secondary | ICD-10-CM | POA: Diagnosis not present

## 2017-05-09 MED ORDER — TORSEMIDE 20 MG PO TABS: 20.0000 mg | ORAL_TABLET | Freq: Two times a day (BID) | ORAL | 3 refills | Status: DC

## 2017-05-09 MED ORDER — LOSARTAN POTASSIUM 100 MG PO TABS: 100.0000 mg | ORAL_TABLET | Freq: Every day | ORAL | 3 refills | Status: DC

## 2017-05-09 MED ORDER — SPIRONOLACTONE 25 MG PO TABS: 25.0000 mg | ORAL_TABLET | Freq: Every day | ORAL | 3 refills | Status: DC

## 2017-05-09 NOTE — Patient Instructions (Addendum)
Medication Instructions:  Your physician has recommended you make the following change in your medication:  1-STOP Vasotec 2-STOP HCTZ 3-START Demadex 20 mg by mouth twice daily 4-START Aldactone 25 mg by mouth daily 5 START Losartan 100 mg by mouth daily  Labwork: Your physician recommends that you return for lab work in: 2 weeks BMET and BNP  Testing/Procedures: NONE  Follow-Up: Your physician recommends that you schedule a follow-up appointment in: 4 weeks with PA or NP.  Your physician wants you to follow-up in: next available with Dr. [name].    If you need a refill on your cardiac medications before your next appointment, please call your pharmacy.   Spironolactone tablets What is this medicine? SPIRONOLACTONE (speer on oh LAK tone) is a diuretic. It helps you make more urine and to lose excess water from your body. This medicine is used to treat high blood pressure, and edema or swelling from heart, kidney, or liver disease. It is also used to treat patients who make too much aldosterone or have low potassium. This medicine may be used for other purposes; ask your health care provider or pharmacist if you have questions. COMMON BRAND NAME(S): Aldactone What should I tell my health care provider before I take this medicine? They need to know if you have any of these conditions: -high blood level of potassium -kidney disease or trouble making urine -liver disease -an unusual or allergic reaction to spironolactone, other medicines, foods, dyes, or preservatives -pregnant or trying to get pregnant -breast-feeding How should I use this medicine? Take this medicine by mouth with a drink of water. Follow the directions on your prescription label. You can take it with or without food. If it upsets your stomach, take it with food. Do not take your medicine more often than directed. Remember that you will need to pass more urine after taking this medicine. Do not take your doses at  a time of day that will cause you problems. Do not take at bedtime. Talk to your pediatrician regarding the use of this medicine in children. While this drug may be prescribed for selected conditions, precautions do apply. Overdosage: If you think you have taken too much of this medicine contact a poison control center or emergency room at once. NOTE: This medicine is only for you. Do not share this medicine with others. What if I miss a dose? If you miss a dose, take it as soon as you can. If it is almost time for your next dose, take only that dose. Do not take double or extra doses. What may interact with this medicine? Do not take this medicine with any of the following medications: -eplerenone This medicine may also interact with the following medications: -corticosteroids -digoxin -lithium -medicines for high blood pressure like ACE inhibitors -skeletal muscle relaxants like tubocurarine -NSAIDs, medicines for pain and inflammation, like ibuprofen or naproxen -potassium products like salt substitute or supplements -pressor amines like norepinephrine -some diuretics This list may not describe all possible interactions. Give your health care provider a list of all the medicines, herbs, non-prescription drugs, or dietary supplements you use. Also tell them if you smoke, drink alcohol, or use illegal drugs. Some items may interact with your medicine. What should I watch for while using this medicine? Visit your doctor or health care professional for regular checks on your progress. Check your blood pressure as directed. Ask your doctor what your blood pressure should be, and when you should contact them. You may need to  be on a special diet while taking this medicine. Ask your doctor. Also, ask how many glasses of fluid you need to drink a day. You must not get dehydrated. This medicine may make you feel confused, dizzy or lightheaded. Drinking alcohol and taking some medicines can make this  worse. Do not drive, use machinery, or do anything that needs mental alertness until you know how this medicine affects you. Do not sit or stand up quickly. What side effects may I notice from receiving this medicine? Side effects that you should report to your doctor or health care professional as soon as possible: -allergic reactions such as skin rash or itching, hives, swelling of the lips, mouth, tongue, or throat -black or tarry stools -fast, irregular heartbeat -fever -muscle pain, cramps -numbness, tingling in hands or feet -trouble breathing -trouble passing urine -unusual bleeding -unusually weak or tired Side effects that usually do not require medical attention (report to your doctor or health care professional if they continue or are bothersome): -change in voice or hair growth -confusion -dizzy, drowsy -dry mouth, increased thirst -enlarged or tender breasts -headache -irregular menstrual periods -sexual difficulty, unable to have an erection -stomach upset This list may not describe all possible side effects. Call your doctor for medical advice about side effects. You may report side effects to FDA at 1-800-FDA-1088. Where should I keep my medicine? Keep out of the reach of children. Store below 25 degrees C (77 degrees F). Throw away any unused medicine after the expiration date. NOTE: This sheet is a summary. It may not cover all possible information. If you have questions about this medicine, talk to your doctor, pharmacist, or health care provider.  2018 Elsevier/Gold Standard (2010-03-03 12:51:30) Torsemide tablets What is this medicine? TORSEMIDE (TORE se mide) is a diuretic. It helps you make more urine and to lose salt and excess water from your body. This medicine is used to treat high blood pressure, and edema or swelling from heart, kidney, or liver disease. This medicine may be used for other purposes; ask your health care provider or pharmacist if you have  questions. COMMON BRAND NAME(S): Demadex What should I tell my health care provider before I take this medicine? They need to know if you have any of these conditions: -abnormal blood electrolytes -diabetes -gout -heart disease -kidney disease -liver disease -small amounts of urine, or difficulty passing urine -an unusual or allergic reaction to torsemide, sulfa drugs, other medicines, foods, dyes, or preservatives -pregnant or trying to get pregnant -breast-feeding How should I use this medicine? Take this medicine by mouth with a glass of water. Follow the directions on the prescription label. You may take this medicine with or without food. If it upsets your stomach, take it with food or milk. Do not take your medicine more often than directed. Remember that you will need to pass more urine after taking this medicine. Do not take your medicine at a time of day that will cause you problems. Do not take at bedtime. Talk to your pediatrician regarding the use of this medicine in children. Special care may be needed. Overdosage: If you think you have taken too much of this medicine contact a poison control center or emergency room at once. NOTE: This medicine is only for you. Do not share this medicine with others. What if I miss a dose? If you miss a dose, take it as soon as you can. If it is almost time for your next dose, take only that  dose. Do not take double or extra doses. What may interact with this medicine? -alcohol -certain antibiotics given by injection certain heart medicines like digoxin -diuretics -lithium -medicines for diabetes -medicines for blood pressure -medicines for cholesterol like cholestyramine -medicines that relax muscles for surgery -NSAIDs, medicines for pain and inflammation, like ibuprofen or naproxen -OTC supplements like ginseng and ephedra -probenecid -steroid medicines like prednisone or cortisone This list may not describe all possible  interactions. Give your health care provider a list of all the medicines, herbs, non-prescription drugs, or dietary supplements you use. Also tell them if you smoke, drink alcohol, or use illegal drugs. Some items may interact with your medicine. What should I watch for while using this medicine? Visit your doctor or health care professional for regular checks on your progress. Check your blood pressure regularly. Ask your doctor or health care professional what your blood pressure should be, and when you should contact him or her. If you are a diabetic, check your blood sugar as directed. You may need to be on a special diet while taking this medicine. Check with your doctor. Also, ask how many glasses of fluid you need to drink a day. You must not get dehydrated. You may get drowsy or dizzy. Do not drive, use machinery, or do anything that needs mental alertness until you know how this drug affects you. Do not stand or sit up quickly, especially if you are an older patient. This reduces the risk of dizzy or fainting spells. Alcohol can make you more drowsy and dizzy. Avoid alcoholic drinks. What side effects may I notice from receiving this medicine? Side effects that you should report to your doctor or health care professional as soon as possible: -allergic reactions such as skin rash or itching, hives, swelling of the lips, mouth, tongue or throat -blood in urine or stool -dry mouth -hearing loss or ringing in the ears -irregular heartbeat -muscle pain, weakness or cramps -pain or difficulty passing urine -unusually weak or tired -vomiting or diarrhea Side effects that usually do not require medical attention (report to your doctor or health care professional if they continue or are bothersome): -dizzy or lightheaded -headache -increased thirst -passing large amounts of urine -sexual difficulties -stomach pain, upset or nausea This list may not describe all possible side effects. Call your  doctor for medical advice about side effects. You may report side effects to FDA at 1-800-FDA-1088. Where should I keep my medicine? Keep out of the reach of children. Store at room temperature between 15 and 30 degrees C (59 and 86 degrees F). Throw away any unused medicine after the expiration date. NOTE: This sheet is a summary. It may not cover all possible information. If you have questions about this medicine, talk to your doctor, pharmacist, or health care provider.  2018 Elsevier/Gold Standard (2008-03-07 11:35:45) Losartan tablets What is this medicine? LOSARTAN (loe SAR tan) is used to treat high blood pressure and to reduce the risk of stroke in certain patients. This drug also slows the progression of kidney disease in patients with diabetes. This medicine may be used for other purposes; ask your health care provider or pharmacist if you have questions. COMMON BRAND NAME(S): Cozaar What should I tell my health care provider before I take this medicine? They need to know if you have any of these conditions: -heart failure -kidney or liver disease -an unusual or allergic reaction to losartan, other medicines, foods, dyes, or preservatives -pregnant or trying to get pregnant -breast-feeding  How should I use this medicine? Take this medicine by mouth with a glass of water. Follow the directions on the prescription label. This medicine can be taken with or without food. Take your doses at regular intervals. Do not take your medicine more often than directed. Talk to your pediatrician regarding the use of this medicine in children. Special care may be needed. Overdosage: If you think you have taken too much of this medicine contact a poison control center or emergency room at once. NOTE: This medicine is only for you. Do not share this medicine with others. What if I miss a dose? If you miss a dose, take it as soon as you can. If it is almost time for your next dose, take only that  dose. Do not take double or extra doses. What may interact with this medicine? -blood pressure medicines -diuretics, especially triamterene, spironolactone, or amiloride -fluconazole -NSAIDs, medicines for pain and inflammation, like ibuprofen or naproxen -potassium salts or potassium supplements -rifampin This list may not describe all possible interactions. Give your health care provider a list of all the medicines, herbs, non-prescription drugs, or dietary supplements you use. Also tell them if you smoke, drink alcohol, or use illegal drugs. Some items may interact with your medicine. What should I watch for while using this medicine? Visit your doctor or health care professional for regular checks on your progress. Check your blood pressure as directed. Ask your doctor or health care professional what your blood pressure should be and when you should contact him or her. Call your doctor or health care professional if you notice an irregular or fast heart beat. Women should inform their doctor if they wish to become pregnant or think they might be pregnant. There is a potential for serious side effects to an unborn child, particularly in the second or third trimester. Talk to your health care professional or pharmacist for more information. You may get drowsy or dizzy. Do not drive, use machinery, or do anything that needs mental alertness until you know how this drug affects you. Do not stand or sit up quickly, especially if you are an older patient. This reduces the risk of dizzy or fainting spells. Alcohol can make you more drowsy and dizzy. Avoid alcoholic drinks. Avoid salt substitutes unless you are told otherwise by your doctor or health care professional. Do not treat yourself for coughs, colds, or pain while you are taking this medicine without asking your doctor or health care professional for advice. Some ingredients may increase your blood pressure. What side effects may I notice from  receiving this medicine? Side effects that you should report to your doctor or health care professional as soon as possible: -confusion, dizziness, light headedness or fainting spells -decreased amount of urine passed -difficulty breathing or swallowing, hoarseness, or tightening of the throat -fast or irregular heart beat, palpitations, or chest pain -skin rash, itching -swelling of your face, lips, tongue, hands, or feet Side effects that usually do not require medical attention (report to your doctor or health care professional if they continue or are bothersome): -cough -decreased sexual function or desire -headache -nasal congestion or stuffiness -nausea or stomach pain -sore or cramping muscles This list may not describe all possible side effects. Call your doctor for medical advice about side effects. You may report side effects to FDA at 1-800-FDA-1088. Where should I keep my medicine? Keep out of the reach of children. Store at room temperature between 15 and 30 degrees C (59  and 86 degrees F). Protect from light. Keep container tightly closed. Throw away any unused medicine after the expiration date. NOTE: This sheet is a summary. It may not cover all possible information. If you have questions about this medicine, talk to your doctor, pharmacist, or health care provider.  2018 Elsevier/Gold Standard (2007-09-01 16:42:18)

## 2017-05-22 ENCOUNTER — Other Ambulatory Visit: Payer: Self-pay | Admitting: Internal Medicine

## 2017-05-23 NOTE — Telephone Encounter (Signed)
CY Please advise on refill. Thanks.  

## 2017-05-23 NOTE — Telephone Encounter (Signed)
Ok to refill total 6 months 

## 2017-05-25 ENCOUNTER — Other Ambulatory Visit: Payer: Medicaid Other

## 2017-05-29 ENCOUNTER — Other Ambulatory Visit: Payer: Self-pay | Admitting: Internal Medicine

## 2017-05-31 ENCOUNTER — Other Ambulatory Visit: Payer: Medicaid Other

## 2017-06-01 ENCOUNTER — Other Ambulatory Visit: Payer: Self-pay | Admitting: Internal Medicine

## 2017-06-05 ENCOUNTER — Encounter (HOSPITAL_BASED_OUTPATIENT_CLINIC_OR_DEPARTMENT_OTHER): Payer: Medicaid Other
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
## 2017-06-07 ENCOUNTER — Encounter: Payer: Self-pay | Admitting: *Deleted

## 2017-06-07 ENCOUNTER — Ambulatory Visit: Payer: Medicaid Other | Admitting: Cardiology

## 2017-06-07 ENCOUNTER — Encounter: Payer: Self-pay | Admitting: Cardiology

## 2017-06-07 ENCOUNTER — Other Ambulatory Visit: Payer: Medicaid Other

## 2017-06-07 VITALS — BP 116/68 | HR 102 | Resp 16 | Ht 60.0 in | Wt 202.8 lb

## 2017-06-07 DIAGNOSIS — R079 Chest pain, unspecified: Secondary | ICD-10-CM | POA: Diagnosis not present

## 2017-06-07 DIAGNOSIS — I272 Pulmonary hypertension, unspecified: Secondary | ICD-10-CM

## 2017-06-07 DIAGNOSIS — I509 Heart failure, unspecified: Secondary | ICD-10-CM

## 2017-06-07 DIAGNOSIS — I10 Essential (primary) hypertension: Secondary | ICD-10-CM

## 2017-06-07 DIAGNOSIS — J449 Chronic obstructive pulmonary disease, unspecified: Secondary | ICD-10-CM

## 2017-06-07 DIAGNOSIS — I519 Heart disease, unspecified: Secondary | ICD-10-CM | POA: Diagnosis not present

## 2017-06-07 DIAGNOSIS — Z79899 Other long term (current) drug therapy: Secondary | ICD-10-CM

## 2017-06-07 DIAGNOSIS — R600 Localized edema: Secondary | ICD-10-CM

## 2017-06-07 LAB — BASIC METABOLIC PANEL
BUN / CREAT RATIO: 23 (ref 9–23)
BUN: 17 mg/dL (ref 6–24)
CO2: 30 mmol/L — ABNORMAL HIGH (ref 20–29)
Calcium: 8.8 mg/dL (ref 8.7–10.2)
Chloride: 96 mmol/L (ref 96–106)
Creatinine, Ser: 0.74 mg/dL (ref 0.57–1.00)
GFR calc Af Amer: 106 mL/min/{1.73_m2} (ref >59)
GFR, EST NON AFRICAN AMERICAN: 92 mL/min/{1.73_m2} (ref >59)
Glucose: 187 mg/dL — ABNORMAL HIGH (ref 65–99)
POTASSIUM: 3.9 mmol/L (ref 3.5–5.2)
SODIUM: 142 mmol/L (ref 134–144)

## 2017-06-07 LAB — PRO B NATRIURETIC PEPTIDE: NT-Pro BNP: 13 pg/mL (ref 0–249)

## 2017-06-07 NOTE — Patient Instructions (Addendum)
Medication Instructions:   Your physician recommends that you continue on your current medications as directed. Please refer to the Current Medication list given to you today.   If you need a refill on your cardiac medications before your next appointment, please call your pharmacy.  Labwork: NONE ORDERED  TODAY     Testing/Procedures: SEE  LETTER FOR LEFT AND HEART CATH ON    Follow-Up:  2 WEEKS POST WITH INGOLD AFTER 06-17-17  ( MAY USE 24/72 HOUR  HOLD SLOT)   Any Other Special Instructions Will Be Listed Below (If Applicable).

## 2017-06-12 ENCOUNTER — Other Ambulatory Visit: Payer: Self-pay | Admitting: Internal Medicine

## 2017-06-15 NOTE — Telephone Encounter (Signed)
Ok refill total 6 months 

## 2017-06-15 NOTE — Telephone Encounter (Signed)
CY please advise on refill. Thanks. 

## 2017-06-16 ENCOUNTER — Telehealth: Payer: Self-pay

## 2017-06-16 NOTE — Telephone Encounter (Signed)
Detailed message left  Patient contacted pre-catheterization at [facility] scheduled for:  06/17/2017 @ 1030 Verified arrival time and place:  NT @ 0800 Confirmed AM meds to be taken pre-cath with sip of water: Take ASA Hold spiro and torsemide Patient must have responsible person to drive home post procedure and observe patient for 24 hours Addl concerns:  This nurse name and # left if any concerns

## 2017-06-17 ENCOUNTER — Encounter (HOSPITAL_COMMUNITY): Payer: Self-pay | Admitting: Cardiology

## 2017-06-17 ENCOUNTER — Ambulatory Visit (HOSPITAL_COMMUNITY)
Admission: RE | Admit: 2017-06-17 | Discharge: 2017-06-17 | Disposition: A | Discharge status: Home / Self Care | Payer: Medicaid Other | Source: Ambulatory Visit | Attending: Cardiology | Admitting: Cardiology

## 2017-06-17 ENCOUNTER — Encounter (HOSPITAL_COMMUNITY)
Admission: RE | Disposition: A | Discharge status: Home / Self Care | Payer: Self-pay | Source: Ambulatory Visit | Attending: Cardiology

## 2017-06-17 DIAGNOSIS — M797 Fibromyalgia: Secondary | ICD-10-CM | POA: Diagnosis not present

## 2017-06-17 DIAGNOSIS — I110 Hypertensive heart disease with heart failure: Secondary | ICD-10-CM | POA: Insufficient documentation

## 2017-06-17 DIAGNOSIS — J449 Chronic obstructive pulmonary disease, unspecified: Secondary | ICD-10-CM | POA: Diagnosis not present

## 2017-06-17 DIAGNOSIS — I10 Essential (primary) hypertension: Secondary | ICD-10-CM | POA: Diagnosis present

## 2017-06-17 DIAGNOSIS — I272 Pulmonary hypertension, unspecified: Secondary | ICD-10-CM | POA: Insufficient documentation

## 2017-06-17 DIAGNOSIS — E662 Morbid (severe) obesity with alveolar hypoventilation: Secondary | ICD-10-CM | POA: Diagnosis present

## 2017-06-17 DIAGNOSIS — I5042 Chronic combined systolic (congestive) and diastolic (congestive) heart failure: Secondary | ICD-10-CM | POA: Insufficient documentation

## 2017-06-17 DIAGNOSIS — Z87891 Personal history of nicotine dependence: Secondary | ICD-10-CM | POA: Insufficient documentation

## 2017-06-17 DIAGNOSIS — Z79899 Other long term (current) drug therapy: Secondary | ICD-10-CM | POA: Diagnosis not present

## 2017-06-17 DIAGNOSIS — K219 Gastro-esophageal reflux disease without esophagitis: Secondary | ICD-10-CM | POA: Diagnosis not present

## 2017-06-17 HISTORY — PX: RIGHT/LEFT HEART CATH AND CORONARY ANGIOGRAPHY: CATH118266

## 2017-06-17 LAB — CBC
HEMATOCRIT: 36 % (ref 36.0–46.0)
HEMOGLOBIN: 11.5 g/dL — AB (ref 12.0–15.0)
MCH: 26.5 pg (ref 26.0–34.0)
MCHC: 31.9 g/dL (ref 30.0–36.0)
MCV: 82.9 fL (ref 78.0–100.0)
Platelets: 249 10*3/uL (ref 150–400)
RBC: 4.34 MIL/uL (ref 3.87–5.11)
RDW: 14.9 % (ref 11.5–15.5)
WBC: 10.2 10*3/uL (ref 4.0–10.5)

## 2017-06-17 LAB — POCT I-STAT 3, ART BLOOD GAS (G3+)
Acid-Base Excess: 2 mmol/L (ref 0.0–2.0)
Bicarbonate: 27.5 mmol/L (ref 20.0–28.0)
O2 Saturation: 95 %
TCO2: 29 mmol/L (ref 22–32)
pCO2 arterial: 47.6 mmHg (ref 32.0–48.0)
pH, Arterial: 7.371 (ref 7.350–7.450)
pO2, Arterial: 81 mmHg — ABNORMAL LOW (ref 83.0–108.0)

## 2017-06-17 LAB — POCT I-STAT 3, VENOUS BLOOD GAS (G3P V)
Acid-Base Excess: 3 mmol/L — ABNORMAL HIGH (ref 0.0–2.0)
Bicarbonate: 29.5 mmol/L — ABNORMAL HIGH (ref 20.0–28.0)
O2 Saturation: 58 %
TCO2: 31 mmol/L (ref 22–32)
pCO2, Ven: 53.9 mmHg (ref 44.0–60.0)
pH, Ven: 7.347 (ref 7.250–7.430)
pO2, Ven: 32 mmHg (ref 32.0–45.0)

## 2017-06-17 LAB — PROTIME-INR
INR: 0.99
Prothrombin Time: 13 seconds (ref 11.4–15.2)

## 2017-06-17 SURGERY — RIGHT/LEFT HEART CATH AND CORONARY ANGIOGRAPHY
Anesthesia: LOCAL

## 2017-06-17 MED ORDER — LIDOCAINE HCL (PF) 1 % IJ SOLN
INTRAMUSCULAR | Status: DC | PRN
  Administered 2017-06-17: 1 mL
  Administered 2017-06-17: 2 mL

## 2017-06-17 MED ORDER — SODIUM CHLORIDE 0.9 % WEIGHT BASED INFUSION
3.0000 mL/kg/h | INTRAVENOUS | Status: AC
  Administered 2017-06-17: 3 mL/kg/h via INTRAVENOUS

## 2017-06-17 MED ORDER — HEPARIN SODIUM (PORCINE) 1000 UNIT/ML IJ SOLN
INTRAMUSCULAR | Status: AC
  Filled 2017-06-17: qty 1

## 2017-06-17 MED ORDER — SODIUM CHLORIDE 0.9 % IV SOLN: 250.0000 mL | INTRAVENOUS | Status: DC | PRN

## 2017-06-17 MED ORDER — IOPAMIDOL (ISOVUE-370) INJECTION 76%
INTRAVENOUS | Status: DC | PRN
  Administered 2017-06-17: 60 mL via INTRA_ARTERIAL

## 2017-06-17 MED ORDER — SODIUM CHLORIDE 0.9% FLUSH: 3.0000 mL | INTRAVENOUS | Status: DC | PRN

## 2017-06-17 MED ORDER — LIDOCAINE HCL (PF) 1 % IJ SOLN
INTRAMUSCULAR | Status: AC
  Filled 2017-06-17: qty 30

## 2017-06-17 MED ORDER — HEPARIN SODIUM (PORCINE) 1000 UNIT/ML IJ SOLN
INTRAMUSCULAR | Status: DC | PRN
  Administered 2017-06-17: 5000 [IU] via INTRAVENOUS

## 2017-06-17 MED ORDER — SODIUM CHLORIDE 0.9% FLUSH: 3.0000 mL | Freq: Two times a day (BID) | INTRAVENOUS | Status: DC

## 2017-06-17 MED ORDER — MIDAZOLAM HCL 2 MG/2ML IJ SOLN
INTRAMUSCULAR | Status: DC | PRN
  Administered 2017-06-17: 1 mg via INTRAVENOUS

## 2017-06-17 MED ORDER — MIDAZOLAM HCL 2 MG/2ML IJ SOLN
INTRAMUSCULAR | Status: AC
  Filled 2017-06-17: qty 2

## 2017-06-17 MED ORDER — HEPARIN (PORCINE) IN NACL 2-0.9 UNIT/ML-% IJ SOLN
INTRAMUSCULAR | Status: AC | PRN
  Administered 2017-06-17: 1000 mL

## 2017-06-17 MED ORDER — SODIUM CHLORIDE 0.9 % WEIGHT BASED INFUSION: 1.0000 mL/kg/h | INTRAVENOUS | Status: DC

## 2017-06-17 MED ORDER — VERAPAMIL HCL 2.5 MG/ML IV SOLN
INTRAVENOUS | Status: DC | PRN
  Administered 2017-06-17: 10 mL via INTRA_ARTERIAL

## 2017-06-17 MED ORDER — IOPAMIDOL (ISOVUE-370) INJECTION 76%
INTRAVENOUS | Status: AC
  Filled 2017-06-17: qty 100

## 2017-06-17 MED ORDER — FENTANYL CITRATE (PF) 100 MCG/2ML IJ SOLN
INTRAMUSCULAR | Status: DC | PRN
  Administered 2017-06-17: 25 ug via INTRAVENOUS

## 2017-06-17 MED ORDER — ASPIRIN 81 MG PO CHEW
81.0000 mg | CHEWABLE_TABLET | ORAL | Status: AC
  Administered 2017-06-17: 81 mg via ORAL

## 2017-06-17 MED ORDER — VERAPAMIL HCL 2.5 MG/ML IV SOLN
INTRAVENOUS | Status: AC
  Filled 2017-06-17: qty 2

## 2017-06-17 MED ORDER — FENTANYL CITRATE (PF) 100 MCG/2ML IJ SOLN
INTRAMUSCULAR | Status: AC
  Filled 2017-06-17: qty 2

## 2017-06-17 MED ORDER — ASPIRIN 81 MG PO CHEW
CHEWABLE_TABLET | ORAL | Status: AC
  Administered 2017-06-17: 81 mg via ORAL
  Filled 2017-06-17: qty 1

## 2017-06-17 MED ORDER — HEPARIN (PORCINE) IN NACL 2-0.9 UNIT/ML-% IJ SOLN
INTRAMUSCULAR | Status: AC
  Filled 2017-06-17: qty 1000

## 2017-06-17 SURGICAL SUPPLY — 12 items
CATH 5FR JL3.5 JR4 ANG PIG MP (CATHETERS) ×1 IMPLANT
CATH BALLN WEDGE 5F 110CM (CATHETERS) ×1 IMPLANT
DEVICE RAD COMP TR BAND LRG (VASCULAR PRODUCTS) ×1 IMPLANT
GLIDESHEATH SLEND SS 6F .021 (SHEATH) ×1 IMPLANT
GUIDEWIRE INQWIRE 1.5J.035X260 (WIRE) IMPLANT
INQWIRE 1.5J .035X260CM (WIRE) ×2
KIT HEART LEFT (KITS) ×2 IMPLANT
PACK CARDIAC CATHETERIZATION (CUSTOM PROCEDURE TRAY) ×2 IMPLANT
SHEATH GLIDE SLENDER 4/5FR (SHEATH) ×1 IMPLANT
SYR MEDRAD MARK V 150ML (SYRINGE) ×2 IMPLANT
TRANSDUCER W/STOPCOCK (MISCELLANEOUS) ×2 IMPLANT
TUBING CIL FLEX 10 FLL-RA (TUBING) ×2 IMPLANT

## 2017-06-17 NOTE — Interval H&P Note (Signed)
History and Physical Interval Note:  06/17/2017 9:15 AM  Kelly Butler  has presented today for surgery, with the diagnosis of cp  The various methods of treatment have been discussed with the patient and family. After consideration of risks, benefits and other options for treatment, the patient has consented to  Procedure(s): RIGHT/LEFT HEART CATH AND CORONARY ANGIOGRAPHY (N/A) as a surgical intervention .  The patient's history has been reviewed, patient examined, no change in status, stable for surgery.  I have reviewed the patient's chart and labs.  Questions were answered to the patient's satisfaction.   Cath Lab Visit (complete for each Cath Lab visit)  Clinical Evaluation Leading to the Procedure:   ACS: No.  Non-ACS:    Anginal Classification: CCS II  Anti-ischemic medical therapy: No Therapy  Non-Invasive Test Results: No non-invasive testing performed  Prior CABG: No previous CABG        [name] 06/17/2017 9:15 AM

## 2017-06-17 NOTE — Discharge Instructions (Signed)

## 2017-07-03 ENCOUNTER — Encounter (HOSPITAL_BASED_OUTPATIENT_CLINIC_OR_DEPARTMENT_OTHER): Payer: Medicaid Other
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
## 2017-07-06 ENCOUNTER — Ambulatory Visit: Payer: Medicaid Other | Admitting: Cardiology

## 2017-07-14 ENCOUNTER — Ambulatory Visit: Payer: Medicaid Other | Admitting: Cardiovascular Disease

## 2017-07-21 ENCOUNTER — Encounter: Payer: Self-pay | Admitting: Cardiology

## 2017-07-21 ENCOUNTER — Ambulatory Visit: Payer: Medicaid Other | Admitting: Cardiology

## 2017-07-21 VITALS — BP 124/72 | HR 95 | Ht 60.0 in | Wt 209.0 lb

## 2017-07-21 DIAGNOSIS — I428 Other cardiomyopathies: Secondary | ICD-10-CM

## 2017-07-21 DIAGNOSIS — I10 Essential (primary) hypertension: Secondary | ICD-10-CM

## 2017-07-21 DIAGNOSIS — R079 Chest pain, unspecified: Secondary | ICD-10-CM | POA: Diagnosis not present

## 2017-07-21 DIAGNOSIS — I272 Pulmonary hypertension, unspecified: Secondary | ICD-10-CM

## 2017-07-21 DIAGNOSIS — J449 Chronic obstructive pulmonary disease, unspecified: Secondary | ICD-10-CM

## 2017-07-21 MED ORDER — ISOSORBIDE MONONITRATE ER 30 MG PO TB24: 30.0000 mg | ORAL_TABLET | Freq: Every day | ORAL | 3 refills | Status: DC

## 2017-07-21 NOTE — Patient Instructions (Addendum)
Medication Instructions:  1. START IMDUR 30 MG DAILY; NEW RX HAS BEEN SENT IN   Labwork: NONE ORDERED TODAY  Testing/Procedures: NONE ORDERED TODAY  Follow-Up: 1. Cecilie Kicks, NP ON 08/12/17 @ 10 AM   2. DR. [name] ON 11/09/17 @ 9:30 AM   3. DR. [name] 08/04/17 @ 9:30  Any Other Special Instructions Will Be Listed Below (If Applicable).     If you need a refill on your cardiac medications before your next appointment, please call your pharmacy.
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
## 2017-07-27 ENCOUNTER — Telehealth: Payer: Self-pay | Admitting: *Deleted

## 2017-07-27 NOTE — Telephone Encounter (Signed)
Spoke w/patient and she stated she would have to check her schedule and callback to schedule Annual/pap.Kelly KitchenMarland Butler
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
## 2017-08-03 ENCOUNTER — Telehealth: Payer: Self-pay | Admitting: *Deleted

## 2017-08-03 NOTE — Telephone Encounter (Signed)
Received PA request from [facility] for Epinephrine  Called [place] [facility] at [phone] to initiate PA  Med approved x 2 months  Approval number [id number] 10/02/17

## 2017-08-04 ENCOUNTER — Ambulatory Visit: Payer: Medicaid Other | Admitting: Cardiovascular Disease

## 2017-08-04 ENCOUNTER — Encounter: Payer: Self-pay | Admitting: Cardiology

## 2017-08-07 ENCOUNTER — Encounter (HOSPITAL_BASED_OUTPATIENT_CLINIC_OR_DEPARTMENT_OTHER): Payer: Medicaid Other
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
## 2017-08-12 ENCOUNTER — Ambulatory Visit: Payer: Medicaid Other | Admitting: Cardiology

## 2017-08-17 ENCOUNTER — Other Ambulatory Visit: Payer: Self-pay | Admitting: Internal Medicine

## 2017-08-22 ENCOUNTER — Ambulatory Visit: Payer: Medicaid Other | Admitting: Obstetrics

## 2017-09-01 ENCOUNTER — Ambulatory Visit: Payer: Medicaid Other | Admitting: Internal Medicine

## 2017-09-04 ENCOUNTER — Encounter (HOSPITAL_BASED_OUTPATIENT_CLINIC_OR_DEPARTMENT_OTHER): Payer: Medicaid Other
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
## 2017-09-12 ENCOUNTER — Ambulatory Visit: Payer: Medicaid Other | Admitting: Cardiology

## 2017-09-12 ENCOUNTER — Encounter: Payer: Self-pay | Admitting: Cardiology

## 2017-09-12 VITALS — BP 136/80 | HR 106 | Ht 60.0 in | Wt 209.2 lb

## 2017-09-12 DIAGNOSIS — I428 Other cardiomyopathies: Secondary | ICD-10-CM

## 2017-09-12 DIAGNOSIS — I272 Pulmonary hypertension, unspecified: Secondary | ICD-10-CM | POA: Diagnosis not present

## 2017-09-12 DIAGNOSIS — J4520 Mild intermittent asthma, uncomplicated: Secondary | ICD-10-CM

## 2017-09-12 DIAGNOSIS — I10 Essential (primary) hypertension: Secondary | ICD-10-CM

## 2017-09-12 MED ORDER — NEBIVOLOL HCL 5 MG PO TABS: 5.0000 mg | ORAL_TABLET | Freq: Every day | ORAL | 3 refills | Status: DC

## 2017-09-12 NOTE — Patient Instructions (Addendum)
Medication Instructions:  1. INCREASE TORSEMIDE 40 MG IN THE MORNING AND 20 MG IN THE PM BEGINNING TOMORROW 3/12, YOU WILL DO THIS FOR 3 DAYS THEN AFTER THE 3 DAYS YOU WILL GO BACK TO YOUR REGULAR DOSE OF 20 MG TWICE DAILY  2. BEGINNING ON 09/16/17 YOU WILL START BYSTOLIC 5 MG, TAKE 1 TABLET DAILY; RX HAS BEEN SENT IN  Labwork: BMET TO BE DONE IN 1 WEEK  Testing/Procedures: NONE ORDERED TODAY  Follow-Up: KEEP YOUR APPT WITH DR. [name] ON Nov 09, 2017  Any Other Special Instructions Will Be Listed Below (If Applicable).     If you need a refill on your cardiac medications before your next appointment, please call your pharmacy.

## 2017-09-14 ENCOUNTER — Telehealth: Payer: Self-pay

## 2017-09-14 ENCOUNTER — Encounter: Payer: Self-pay | Admitting: Cardiology

## 2017-09-14 NOTE — Telephone Encounter (Signed)
Received a PA request for Bystolic from [facility] via fax.  I attempted to do the PA through covermymeds but received a message that the pts chances of getting Bystolic is slim as she needs to try and fail generic Atenolol, Carvedilol, Metoprolol Succinate, Bisoprolol or Metoprolol Tartrate.  Please advise.

## 2017-09-14 NOTE — Telephone Encounter (Signed)
Pt has severe Asthma and COPD so were were trying to avoid exacerbations. She can try lopressor 25 mg BID.  Please tell pt if SOB or wheezes increase to stop.

## 2017-09-16 NOTE — Telephone Encounter (Signed)
**Note De-Identified  Obfuscation** Per Cecilie Kicks, NP I attempted a Bystolic PA over the phone with Caryl Pina at [facility] as Bystolic is preferred for this pt as she has severe asthma and COPD.  Per Caryl Pina we can call them back at [phone] in 24 hours for decision on Bystolic PA.  PA# [id number] Reference# [id number]

## 2017-09-18 ENCOUNTER — Encounter (HOSPITAL_BASED_OUTPATIENT_CLINIC_OR_DEPARTMENT_OTHER): Payer: Medicaid Other

## 2017-09-19 NOTE — Telephone Encounter (Signed)
I called [employer] and s/w Leonides Sake. Per Leonides Sake this PA has been denied. Reason: The pt must try and fail 2 of the preferred medications on the plans list before this PA can be approved.  As already advised by Cecilie Kicks, NP on 09/14/17 (prior to attempting this PA) The pt will be advised to take Lopressor 25 mg twice daily.  I have left a message asking the pt to call me back.

## 2017-09-19 NOTE — Telephone Encounter (Signed)
Follow up ° ° ° °Patient returning call.  Please call °

## 2017-09-19 NOTE — Telephone Encounter (Signed)
LMTCB

## 2017-09-21 ENCOUNTER — Other Ambulatory Visit: Payer: Medicaid Other

## 2017-09-21 NOTE — Telephone Encounter (Signed)
Will forward message to Teresa Coombs [facility] RMA as I will not be in the office again until Monday 3/25.

## 2017-09-22 NOTE — Telephone Encounter (Signed)
Tried calling pt re: p/a for Bystolic. Left a message for pt to call back. I will fwd this back to [facility], LPN, PA Coordinator, for I will not be back in the office until 09/26/17.

## 2017-09-23 ENCOUNTER — Ambulatory Visit: Payer: Medicaid Other | Admitting: Obstetrics

## 2017-09-23 ENCOUNTER — Other Ambulatory Visit: Payer: Medicaid Other

## 2017-09-23 NOTE — Telephone Encounter (Signed)
F/U Call:  Patient states that she received a letter from [facility] and it was denied. Medicaid  would like office to try two other medications in the same family before they approve.  Patient is aware that Kelly Butler is out of the office today.

## 2017-09-26 ENCOUNTER — Other Ambulatory Visit: Payer: Medicaid Other

## 2017-09-26 MED ORDER — METOPROLOL TARTRATE 25 MG PO TABS: 25.0000 mg | ORAL_TABLET | Freq: Two times a day (BID) | ORAL | 3 refills | Status: DC

## 2017-09-26 NOTE — Telephone Encounter (Signed)
The pt is advised to take Lopressor 25 mg BID in place of Bystolic as the pts insurance will not cover Bystolic at this time. She is also advised that if her SOB and/or wheezeing increases to stop taking Lopressor and to call the office to let us know.  She verbalized understanding and thanked me for calling her back.

## 2017-09-29 ENCOUNTER — Other Ambulatory Visit: Payer: Medicaid Other

## 2017-09-29 DIAGNOSIS — I10 Essential (primary) hypertension: Secondary | ICD-10-CM

## 2017-09-29 LAB — BASIC METABOLIC PANEL
BUN / CREAT RATIO: 19 (ref 9–23)
BUN: 21 mg/dL (ref 6–24)
CALCIUM: 8.7 mg/dL (ref 8.7–10.2)
CO2: 21 mmol/L (ref 20–29)
Chloride: 100 mmol/L (ref 96–106)
Creatinine, Ser: 1.08 mg/dL — ABNORMAL HIGH (ref 0.57–1.00)
GFR, EST AFRICAN AMERICAN: 67 mL/min/{1.73_m2} (ref >59)
GFR, EST NON AFRICAN AMERICAN: 58 mL/min/{1.73_m2} — AB (ref >59)
Glucose: 280 mg/dL — ABNORMAL HIGH (ref 65–99)
Potassium: 3.7 mmol/L (ref 3.5–5.2)
Sodium: 139 mmol/L (ref 134–144)

## 2017-09-30 ENCOUNTER — Ambulatory Visit: Payer: Medicaid Other | Admitting: Obstetrics

## 2017-09-30 ENCOUNTER — Telehealth: Payer: Self-pay | Admitting: *Deleted

## 2017-09-30 DIAGNOSIS — Z79899 Other long term (current) drug therapy: Secondary | ICD-10-CM

## 2017-09-30 NOTE — Telephone Encounter (Signed)
Pt made aware of her lab results. See result note. 

## 2017-10-14 ENCOUNTER — Other Ambulatory Visit: Payer: Medicaid Other

## 2017-10-21 ENCOUNTER — Other Ambulatory Visit: Payer: Medicaid Other

## 2017-10-28 ENCOUNTER — Other Ambulatory Visit: Payer: Medicaid Other

## 2017-11-04 ENCOUNTER — Other Ambulatory Visit: Payer: Medicaid Other | Admitting: *Deleted

## 2017-11-04 DIAGNOSIS — Z79899 Other long term (current) drug therapy: Secondary | ICD-10-CM

## 2017-11-04 LAB — BASIC METABOLIC PANEL
BUN / CREAT RATIO: 22 (ref 9–23)
BUN: 26 mg/dL — AB (ref 6–24)
CALCIUM: 9.8 mg/dL (ref 8.7–10.2)
CHLORIDE: 96 mmol/L (ref 96–106)
CO2: 27 mmol/L (ref 20–29)
CREATININE: 1.2 mg/dL — AB (ref 0.57–1.00)
GFR, EST AFRICAN AMERICAN: 59 mL/min/{1.73_m2} — AB (ref >59)
GFR, EST NON AFRICAN AMERICAN: 51 mL/min/{1.73_m2} — AB (ref >59)
Glucose: 166 mg/dL — ABNORMAL HIGH (ref 65–99)
Potassium: 3.5 mmol/L (ref 3.5–5.2)
Sodium: 139 mmol/L (ref 134–144)
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
## 2017-11-09 ENCOUNTER — Ambulatory Visit: Payer: Medicaid Other | Admitting: Cardiovascular Disease

## 2017-11-10 ENCOUNTER — Ambulatory Visit: Payer: Medicaid Other | Admitting: Cardiovascular Disease

## 2017-11-11 ENCOUNTER — Telehealth: Payer: Self-pay | Admitting: Cardiovascular Disease

## 2017-11-11 IMAGING — DX DG CHEST 2V
2 series · 2 of 2 positions shown · non-contrast
Comparison: 10/08/2014 chest radiograph

CLINICAL DATA: 56 y/o  F; 1 week of increasing shortness of breath.

EXAM:
CHEST  2 VIEW

[chest pa]
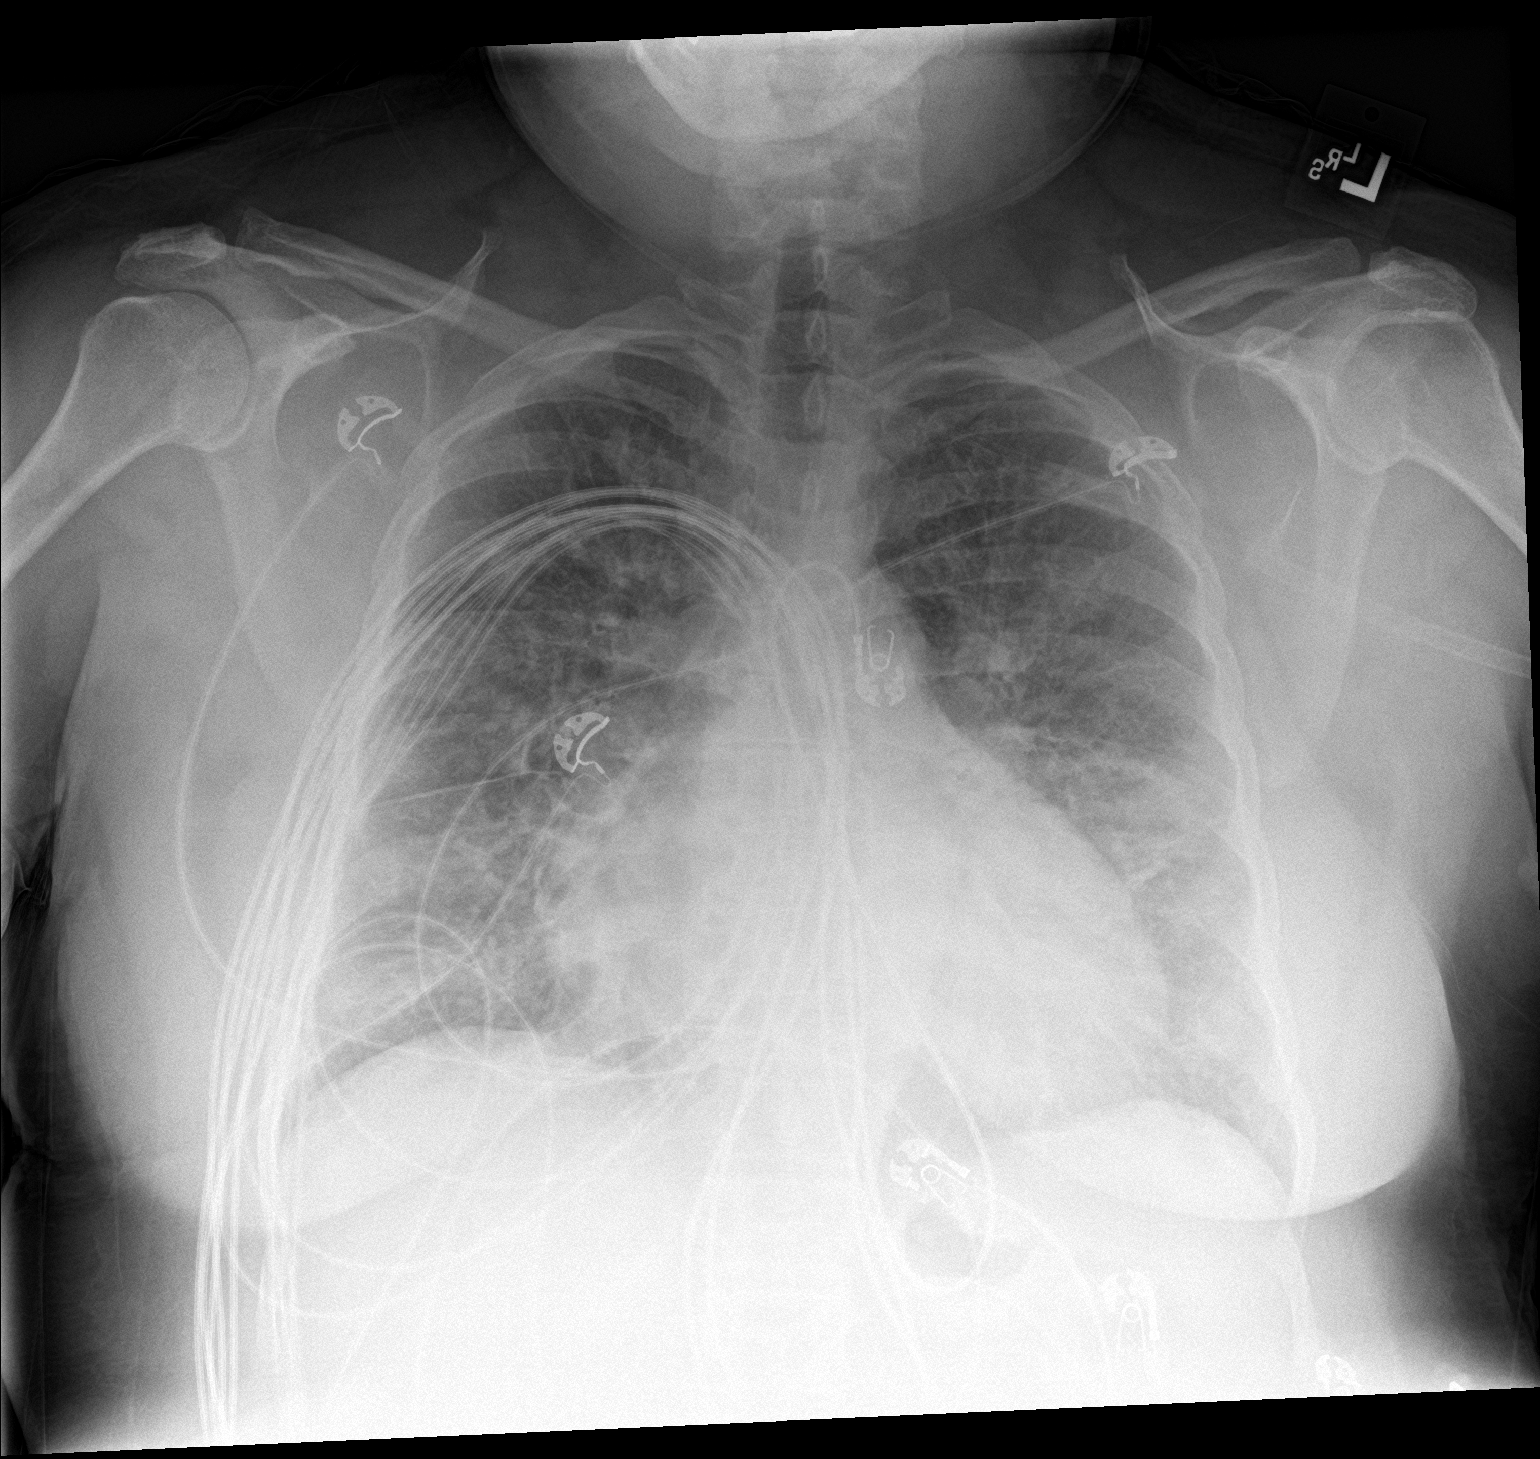

[chest lat]
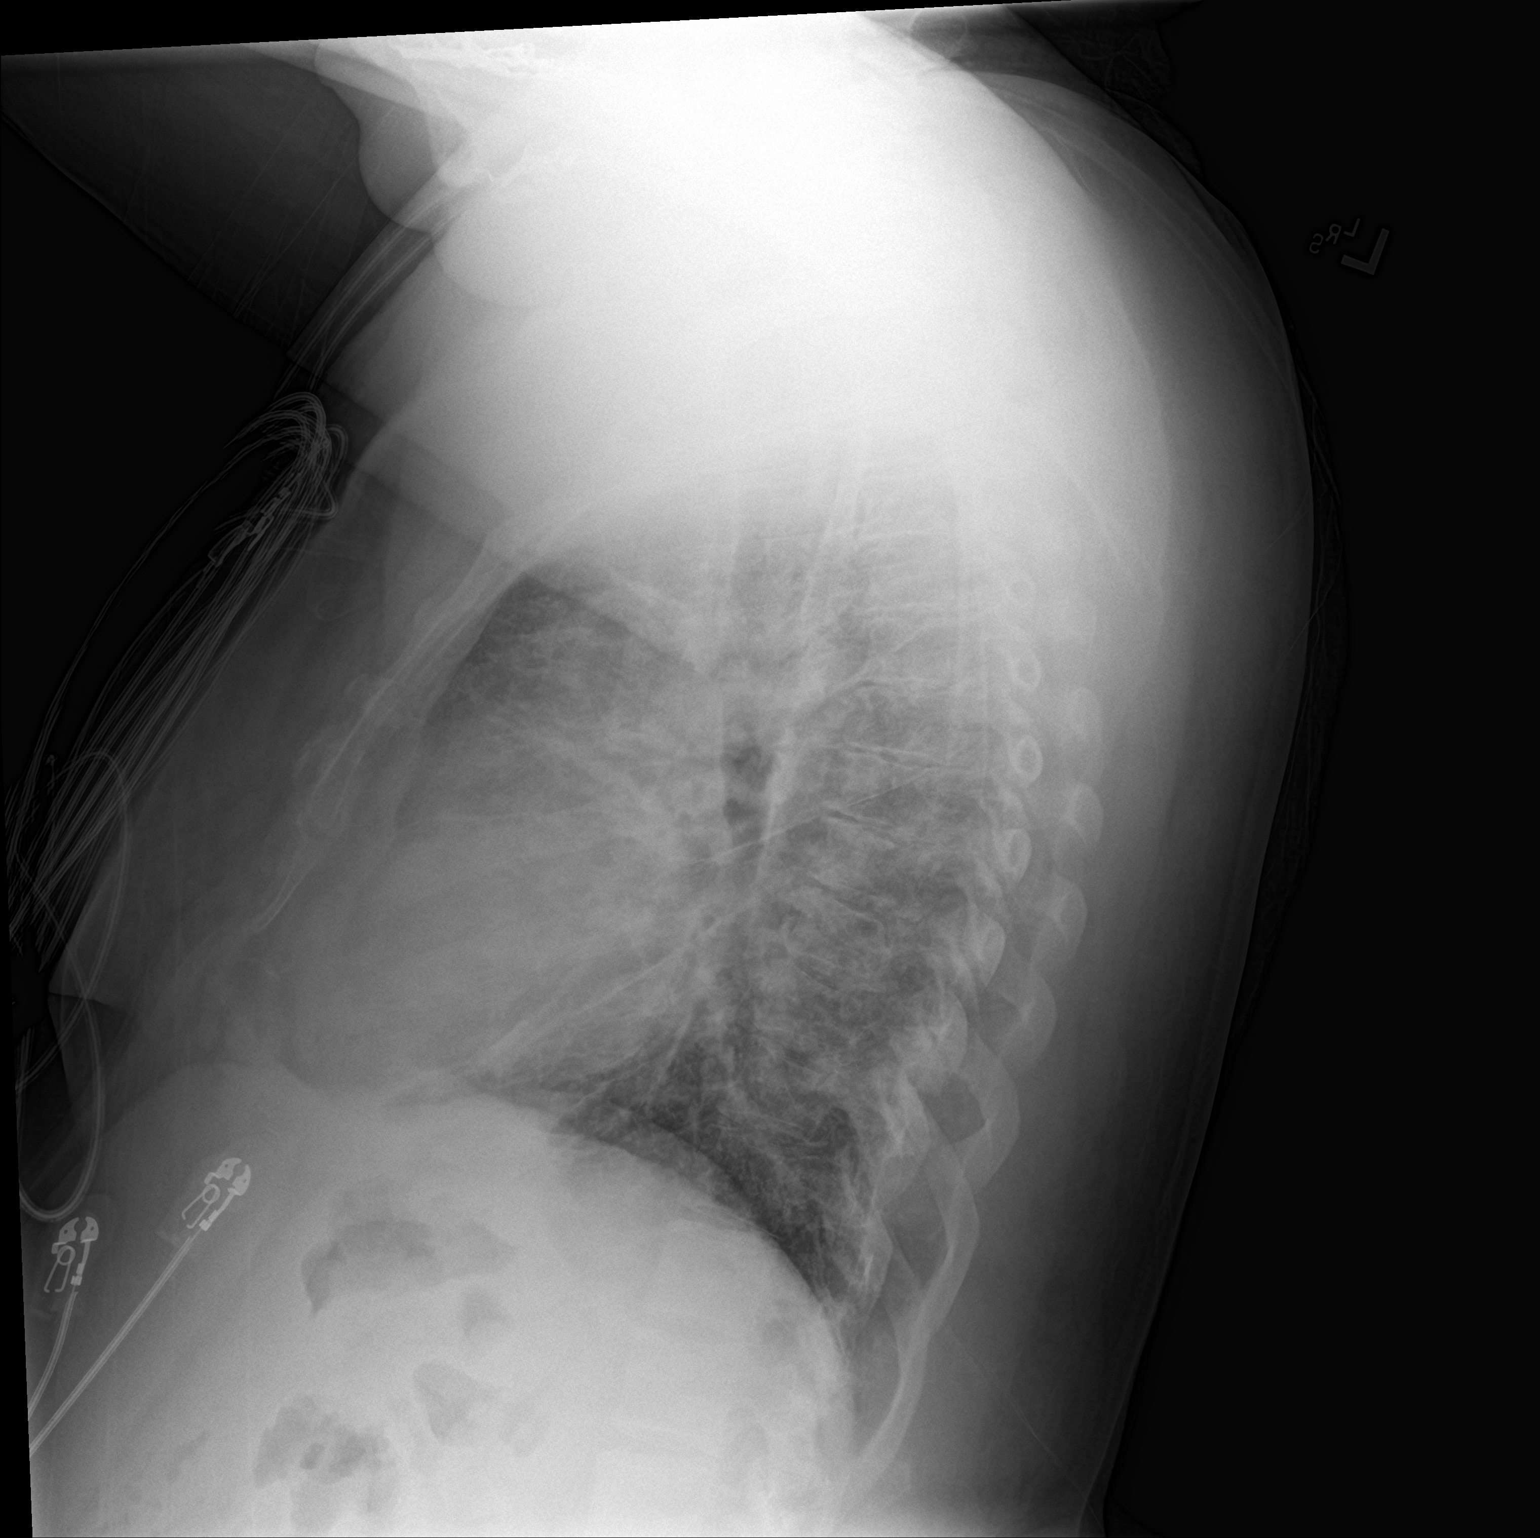

[2 of 2 positions shown; findings below may reference images not displayed]

FINDINGS: Cardiomegaly. Increase interstitial prominence and patchy opacities
of the lungs diffusely. The the no pleural effusion or pneumothorax.
Bones are unremarkable.
IMPRESSION: Cardiomegaly and patchy opacities likely representing alveolar
pulmonary edema. Underlying pneumonia not excluded.

By: Zvorinka Nikles M.D.

## 2017-11-11 NOTE — Telephone Encounter (Signed)
New Message   Patient is returning call in reference to lab results. Please call to discuss.  

## 2017-11-11 NOTE — Telephone Encounter (Signed)
Left message for patient to call back  

## 2017-11-15 ENCOUNTER — Other Ambulatory Visit: Payer: Self-pay | Admitting: Obstetrics

## 2017-11-15 DIAGNOSIS — Z1231 Encounter for screening mammogram for malignant neoplasm of breast: Secondary | ICD-10-CM

## 2017-11-17 ENCOUNTER — Ambulatory Visit: Payer: Medicaid Other | Admitting: Obstetrics

## 2017-11-21 NOTE — Telephone Encounter (Signed)
Sent letter to patient of lab results.

## 2017-11-24 ENCOUNTER — Other Ambulatory Visit: Payer: Self-pay | Admitting: Internal Medicine

## 2017-11-25 NOTE — Telephone Encounter (Signed)
CY Please advise on refill of Ambien. Thanks.

## 2017-11-25 NOTE — Telephone Encounter (Signed)
Anoro and Proair can be refilled total 12 months; Zolpidem for total 6 months.

## 2017-12-08 ENCOUNTER — Ambulatory Visit: Payer: Medicaid Other

## 2017-12-11 ENCOUNTER — Other Ambulatory Visit: Payer: Self-pay | Admitting: Internal Medicine

## 2017-12-11 ENCOUNTER — Ambulatory Visit (HOSPITAL_BASED_OUTPATIENT_CLINIC_OR_DEPARTMENT_OTHER): Payer: Medicaid Other | Attending: Internal Medicine

## 2017-12-14 ENCOUNTER — Encounter: Payer: Self-pay | Admitting: Internal Medicine

## 2017-12-14 ENCOUNTER — Other Ambulatory Visit (INDEPENDENT_AMBULATORY_CARE_PROVIDER_SITE_OTHER): Payer: Medicaid Other

## 2017-12-14 ENCOUNTER — Ambulatory Visit (INDEPENDENT_AMBULATORY_CARE_PROVIDER_SITE_OTHER): Payer: Medicaid Other | Admitting: Internal Medicine

## 2017-12-14 VITALS — BP 128/72 | HR 73 | Ht 60.0 in | Wt 210.8 lb

## 2017-12-14 DIAGNOSIS — E662 Morbid (severe) obesity with alveolar hypoventilation: Secondary | ICD-10-CM | POA: Diagnosis not present

## 2017-12-14 DIAGNOSIS — J4550 Severe persistent asthma, uncomplicated: Secondary | ICD-10-CM

## 2017-12-14 DIAGNOSIS — J4540 Moderate persistent asthma, uncomplicated: Secondary | ICD-10-CM

## 2017-12-14 DIAGNOSIS — G4733 Obstructive sleep apnea (adult) (pediatric): Secondary | ICD-10-CM

## 2017-12-14 LAB — CBC WITH DIFFERENTIAL/PLATELET
BASOS PCT: 0.3 % (ref 0.0–3.0)
Basophils Absolute: 0 10*3/uL (ref 0.0–0.1)
EOS PCT: 3 % (ref 0.0–5.0)
Eosinophils Absolute: 0.4 10*3/uL (ref 0.0–0.7)
HEMATOCRIT: 31.5 % — AB (ref 36.0–46.0)
Hemoglobin: 10.8 g/dL — ABNORMAL LOW (ref 12.0–15.0)
LYMPHS PCT: 26.2 % (ref 12.0–46.0)
Lymphs Abs: 3.3 10*3/uL (ref 0.7–4.0)
MCHC: 34.2 g/dL (ref 30.0–36.0)
MCV: 82.8 fl (ref 78.0–100.0)
Monocytes Absolute: 0.5 10*3/uL (ref 0.1–1.0)
Monocytes Relative: 4.2 % (ref 3.0–12.0)
NEUTROS ABS: 8.3 10*3/uL — AB (ref 1.4–7.7)
Neutrophils Relative %: 66.3 % (ref 43.0–77.0)
PLATELETS: 309 10*3/uL (ref 150.0–400.0)
RBC: 3.81 Mil/uL — ABNORMAL LOW (ref 3.87–5.11)
RDW: 14.9 % (ref 11.5–15.5)
WBC: 12.5 10*3/uL — ABNORMAL HIGH (ref 4.0–10.5)

## 2017-12-14 LAB — POCT EXHALED NITRIC OXIDE: FeNO level (ppb): 16

## 2017-12-14 NOTE — Patient Instructions (Addendum)
Order- lab- CBC w diff, Total IgE     Dx  Asthma severe persistent  Order- FENO  We will look at trying one of the other biologics this time, since you were on Xolair before.

## 2017-12-14 NOTE — Assessment & Plan Note (Addendum)
She does not feel this is enough of a problem that she wants to pursue it again for now.  I have asked her to work on getting her weight down, positional sleep off flat of back.

## 2017-12-14 NOTE — Assessment & Plan Note (Signed)
Restriction dominates her PFT scores and is consistent with her body habitus.  So far she does not need respiratory support.  Hopefully over time she can lose some weight and avoid needing positive pressure.

## 2017-12-14 NOTE — Assessment & Plan Note (Signed)
She has substantial contribution to her dyspnea from obesity, especially obesity hypoventilation with restriction, and from her cardiac diastolic dysfunction.  I think we are controlling the bronchospastic component most of the time although she does notice some wheeze.  We will reevaluate the role of biologic therapy. Plan-lab for CBC with differential, IgE, FENO
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
## 2017-12-15 LAB — IGE: IGE (IMMUNOGLOBULIN E), SERUM: 2497 kU/L — AB (ref <=114)

## 2017-12-16 ENCOUNTER — Telehealth: Payer: Self-pay | Admitting: Internal Medicine

## 2017-12-16 ENCOUNTER — Ambulatory Visit: Payer: Medicaid Other | Admitting: Obstetrics

## 2017-12-16 ENCOUNTER — Other Ambulatory Visit (HOSPITAL_COMMUNITY)
Admission: RE | Admit: 2017-12-16 | Discharge: 2017-12-16 | Disposition: A | Discharge status: Home / Self Care | Payer: Medicaid Other | Source: Ambulatory Visit | Attending: Obstetrics | Admitting: Obstetrics

## 2017-12-16 ENCOUNTER — Encounter: Payer: Self-pay | Admitting: Obstetrics

## 2017-12-16 VITALS — BP 135/77 | HR 67 | Ht 60.0 in | Wt 211.8 lb

## 2017-12-16 DIAGNOSIS — Z0000 Encounter for general adult medical examination without abnormal findings: Secondary | ICD-10-CM | POA: Diagnosis not present

## 2017-12-16 DIAGNOSIS — N393 Stress incontinence (female) (male): Secondary | ICD-10-CM

## 2017-12-16 DIAGNOSIS — E28319 Asymptomatic premature menopause: Secondary | ICD-10-CM

## 2017-12-16 DIAGNOSIS — Z124 Encounter for screening for malignant neoplasm of cervix: Secondary | ICD-10-CM

## 2017-12-16 DIAGNOSIS — N898 Other specified noninflammatory disorders of vagina: Secondary | ICD-10-CM

## 2017-12-16 DIAGNOSIS — R102 Pelvic and perineal pain: Secondary | ICD-10-CM

## 2017-12-16 DIAGNOSIS — Z01419 Encounter for gynecological examination (general) (routine) without abnormal findings: Secondary | ICD-10-CM

## 2017-12-16 DIAGNOSIS — Z113 Encounter for screening for infections with a predominantly sexual mode of transmission: Secondary | ICD-10-CM

## 2017-12-16 NOTE — Progress Notes (Signed)
Patient presents for her Annual Exam today.  Last pap: 04/20/2016  Mammogram: 12/07/2016 WNL   Colonoscopy: 2014 or 2015 per pt told to repeat in 5 yrs.   STD Screening: Full panel   CC: Per pt she has no concerns  Pt wants Wellness labs drawn and check hormone levels.
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
## 2017-12-16 NOTE — Progress Notes (Signed)
LMTCB

## 2017-12-16 NOTE — Telephone Encounter (Signed)
Called and spoke with pt to let her know the results of her labwork and recommendations per CY.  Pt expressed understanding and stated she was fine with starting a biologic to help with her symptoms.  Dr. [name], please advise out of [place] or [place] that you want pt to start on. Thanks!

## 2017-12-17 LAB — HEPATITIS B SURFACE ANTIGEN: HEP B S AG: NEGATIVE

## 2017-12-17 LAB — HEPATITIS C ANTIBODY

## 2017-12-17 LAB — HIV ANTIBODY (ROUTINE TESTING W REFLEX): HIV SCREEN 4TH GENERATION: NONREACTIVE

## 2017-12-17 LAB — RPR: RPR: NONREACTIVE

## 2017-12-19 NOTE — Telephone Encounter (Signed)
I returned pt's call, [email or web address].

## 2017-12-19 NOTE — Telephone Encounter (Signed)
Kelly Butler- please see if we can qualify her for [facility].

## 2017-12-19 NOTE — Telephone Encounter (Signed)
Routing this to [employer]

## 2017-12-20 ENCOUNTER — Ambulatory Visit (HOSPITAL_COMMUNITY): Payer: Medicaid Other

## 2017-12-20 LAB — CYTOLOGY - PAP
DIAGNOSIS: NEGATIVE
HPV (WINDOPATH): NOT DETECTED

## 2017-12-20 LAB — CERVICOVAGINAL ANCILLARY ONLY
Bacterial vaginitis: NEGATIVE
CANDIDA VAGINITIS: POSITIVE — AB
Chlamydia: NEGATIVE
Neisseria Gonorrhea: NEGATIVE
TRICH (WINDOWPATH): NEGATIVE

## 2017-12-20 NOTE — Telephone Encounter (Signed)
Spoke with patient-she will come by the office-ask to speak with Alroy Bailiff and sign [name] form. All other information has been completed and will need to be faxed to [employer] Enrollment after she signs.

## 2017-12-23 ENCOUNTER — Other Ambulatory Visit: Payer: Self-pay | Admitting: Obstetrics

## 2017-12-23 DIAGNOSIS — B373 Candidiasis of vulva and vagina: Secondary | ICD-10-CM

## 2017-12-23 DIAGNOSIS — B3731 Acute candidiasis of vulva and vagina: Secondary | ICD-10-CM

## 2017-12-23 MED ORDER — FLUCONAZOLE 150 MG PO TABS: 150.0000 mg | ORAL_TABLET | Freq: Once | ORAL | 0 refills | Status: DC

## 2017-12-23 NOTE — Telephone Encounter (Signed)
Kelly Butler with [employer] calling for [employer]. CB is [phone], opt 2. She ants to know if we want to continue receiving calls regarding results as well as the faxes then send [place] or if the fax they send [place] will be sufficient.

## 2017-12-26 ENCOUNTER — Ambulatory Visit (HOSPITAL_COMMUNITY): Payer: Medicaid Other

## 2017-12-26 NOTE — Telephone Encounter (Signed)
Called [place] to let them I got ahead of myself and Faxed [employer] paperwork unsigned. They went ahead and ran the benefits anyway. Will fax enrollment from back once pt comes in to sign it.

## 2017-12-27 ENCOUNTER — Telehealth: Payer: Self-pay | Admitting: *Deleted

## 2017-12-27 ENCOUNTER — Ambulatory Visit: Payer: Medicaid Other | Admitting: Obstetrics

## 2017-12-27 ENCOUNTER — Ambulatory Visit
Admission: RE | Admit: 2017-12-27 | Discharge: 2017-12-27 | Disposition: A | Discharge status: Home / Self Care | Payer: Medicaid Other | Source: Ambulatory Visit | Attending: Obstetrics | Admitting: Obstetrics

## 2017-12-27 DIAGNOSIS — Z1231 Encounter for screening mammogram for malignant neoplasm of breast: Secondary | ICD-10-CM

## 2017-12-28 NOTE — Telephone Encounter (Signed)
Noted and placed message if previous phone message. Will close this message.

## 2017-12-28 NOTE — Telephone Encounter (Signed)
Roman[place], Renaldo Fiddler, CPhT routed conversation to [place], [place] B 21 hours ago (3:42 PM)    [place], [place] [phone]  Dewayne Shorter, CPhT 21 hours ago (3:40 PM)    pt will be in tomorrow afternoon to sign the paper work.    Incoming call

## 2018-01-02 NOTE — Telephone Encounter (Signed)
Yes, pt was able to come in and sign [place] paperwork this morning. I going to fax her P/A, now that the paperwork is complete. Will leave note open for approval or appeal.
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
## 2018-01-02 NOTE — Telephone Encounter (Signed)
Tammy please advise if pt came in to sign paperwork. Thanks.

## 2018-01-03 ENCOUNTER — Encounter: Payer: Self-pay | Admitting: Cardiology

## 2018-01-03 ENCOUNTER — Ambulatory Visit (INDEPENDENT_AMBULATORY_CARE_PROVIDER_SITE_OTHER): Payer: Medicaid Other | Admitting: Cardiology

## 2018-01-03 VITALS — BP 134/62 | HR 88 | Ht 60.0 in | Wt 214.4 lb

## 2018-01-03 DIAGNOSIS — E7849 Other hyperlipidemia: Secondary | ICD-10-CM | POA: Diagnosis not present

## 2018-01-03 DIAGNOSIS — R079 Chest pain, unspecified: Secondary | ICD-10-CM

## 2018-01-03 DIAGNOSIS — I10 Essential (primary) hypertension: Secondary | ICD-10-CM

## 2018-01-03 DIAGNOSIS — I428 Other cardiomyopathies: Secondary | ICD-10-CM

## 2018-01-03 DIAGNOSIS — I272 Pulmonary hypertension, unspecified: Secondary | ICD-10-CM

## 2018-01-03 DIAGNOSIS — J449 Chronic obstructive pulmonary disease, unspecified: Secondary | ICD-10-CM

## 2018-01-03 LAB — LIPID PANEL
CHOLESTEROL TOTAL: 126 mg/dL (ref 100–199)
Chol/HDL Ratio: 3 ratio (ref 0.0–4.4)
HDL: 42 mg/dL (ref >39)
LDL CALC: 51 mg/dL (ref 0–99)
Triglycerides: 167 mg/dL — ABNORMAL HIGH (ref 0–149)
VLDL Cholesterol Cal: 33 mg/dL (ref 5–40)

## 2018-01-03 NOTE — Patient Instructions (Signed)
Medication Instructions:  Your physician recommends that you continue on your current medications as directed. Please refer to the Current Medication list given to you today.   Labwork: TODAY: LIPIDS  Testing/Procedures: None ordered  Follow-Up: Your physician wants you to follow-up in: [place]. [name].  Any Other Special Instructions Will Be Listed Below (If Applicable).     If you need a refill on your cardiac medications before your next appointment, please call your pharmacy.

## 2018-01-04 ENCOUNTER — Telehealth: Payer: Self-pay | Admitting: Internal Medicine

## 2018-01-04 NOTE — Telephone Encounter (Signed)
lmtcb x1 for pt on both home and mobile number on file. [name]

## 2018-01-04 NOTE — Telephone Encounter (Signed)
patient states at her appointment primary doctor; was told her chest was tight and may need to be seen before September; used nebulizer when got home and relief lasted for only about 2 hours; would like to further discuss with nurse on what she can do; having trouble breathing in this weather.  decline appointment; requesting to see CY only...pt contact # [phone]

## 2018-01-06 ENCOUNTER — Other Ambulatory Visit: Payer: Self-pay | Admitting: Internal Medicine

## 2018-01-06 ENCOUNTER — Other Ambulatory Visit: Payer: Self-pay | Admitting: Obstetrics

## 2018-01-06 DIAGNOSIS — B373 Candidiasis of vulva and vagina: Secondary | ICD-10-CM

## 2018-01-06 DIAGNOSIS — B3731 Acute candidiasis of vulva and vagina: Secondary | ICD-10-CM

## 2018-01-06 NOTE — Telephone Encounter (Signed)
P/A is in progress, I haven't heard fom [employer]. I will call them 01/09/18.

## 2018-01-06 NOTE — Telephone Encounter (Signed)
Please review for refill.  

## 2018-01-06 NOTE — Telephone Encounter (Signed)
Attempted to call pt but no answer. Left message for pt to return call x2 

## 2018-01-09 ENCOUNTER — Encounter: Payer: Self-pay | Admitting: Internal Medicine

## 2018-01-09 ENCOUNTER — Ambulatory Visit (HOSPITAL_COMMUNITY)
Admission: RE | Admit: 2018-01-09 | Discharge: 2018-01-09 | Disposition: A | Discharge status: Home / Self Care | Payer: Medicaid Other | Source: Ambulatory Visit | Attending: Obstetrics | Admitting: Obstetrics

## 2018-01-09 ENCOUNTER — Ambulatory Visit (INDEPENDENT_AMBULATORY_CARE_PROVIDER_SITE_OTHER): Payer: Medicaid Other | Admitting: Internal Medicine

## 2018-01-09 VITALS — BP 120/80 | HR 95 | Ht 60.0 in | Wt 210.0 lb

## 2018-01-09 DIAGNOSIS — J4551 Severe persistent asthma with (acute) exacerbation: Secondary | ICD-10-CM

## 2018-01-09 DIAGNOSIS — J45901 Unspecified asthma with (acute) exacerbation: Secondary | ICD-10-CM

## 2018-01-09 DIAGNOSIS — R102 Pelvic and perineal pain: Secondary | ICD-10-CM

## 2018-01-09 DIAGNOSIS — I5042 Chronic combined systolic (congestive) and diastolic (congestive) heart failure: Secondary | ICD-10-CM

## 2018-01-09 DIAGNOSIS — D259 Leiomyoma of uterus, unspecified: Secondary | ICD-10-CM | POA: Diagnosis not present

## 2018-01-09 MED ORDER — METHYLPREDNISOLONE ACETATE 80 MG/ML IJ SUSP
80.0000 mg | Freq: Once | INTRAMUSCULAR | Status: AC
  Administered 2018-01-09: 80 mg via INTRAMUSCULAR

## 2018-01-09 MED ORDER — PREDNISONE 10 MG PO TABS: ORAL_TABLET | ORAL | 0 refills | Status: DC

## 2018-01-09 MED ORDER — LEVALBUTEROL HCL 0.63 MG/3ML IN NEBU
0.6300 mg | INHALATION_SOLUTION | Freq: Once | RESPIRATORY_TRACT | Status: AC
  Administered 2018-01-09: 0.63 mg via RESPIRATORY_TRACT

## 2018-01-09 NOTE — Patient Instructions (Addendum)
We will start application for Dupixent biologic therapy. This may need to wait until my regular nurse is back.  Order neb xop 0.63    Dx Asthma exacerbation            Depo 40  Keep September appointment
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
## 2018-01-09 NOTE — Telephone Encounter (Signed)
See if she can be doubled in today at 11:30

## 2018-01-09 NOTE — Telephone Encounter (Signed)
Spoke with pt, states that she has been experiencing increased dyspnea, was told by her PCP that her chest was tight.  Pt denies fever, mucus production, chest pain.  Pt requesting an appt with only CY.  I tried to offer 3:30 opening this afternoon.  Pt declined, saying that she can only come in for morning appts.    CY please advise when pt can be worked in for a morning appt.  Thanks!

## 2018-01-09 NOTE — Telephone Encounter (Signed)
Pt was approved Ref.#: [id number] Dates: 01/02/18 thru 12/28/2018 call ref#: [id number].  I called [facility] To give a verbal rx for Fasenra loading and maintenance dose. [facility]. To get her consent to ship and then call us to set up del.[name]

## 2018-01-09 NOTE — Telephone Encounter (Signed)
Called and spoke with pt stating that CY said she could be seen today, 7/8 at 11:30. Pt expressed understanding.   Scheduled acute visit for pt today at 11:30 with CY.  Nothing further needed.

## 2018-01-11 DIAGNOSIS — J4551 Severe persistent asthma with (acute) exacerbation: Secondary | ICD-10-CM | POA: Insufficient documentation

## 2018-01-11 NOTE — Telephone Encounter (Signed)
I called [facility] to find out where we are. They are verifying her ins. I ask the rep I was transferred to if she could e-mail that team and ask them to put a rush on this. She said she would. Will wait for pharm to me to set up shipment. The rep also suggested I call tomorrow afternoon if they don't.

## 2018-01-11 NOTE — Assessment & Plan Note (Signed)
She suggests the hot weather is triggering this flare.  I do not think she has a bacterial infection.  She has been assessed by cardiology. Plan-go forward with [name] biologic therapy when approved.  For today given Xopenex neb treatment, depomedrol

## 2018-01-12 NOTE — Telephone Encounter (Signed)
I'll call back 03/16/18, it's 5:28.

## 2018-01-17 NOTE — Telephone Encounter (Signed)
I called [facility]st, the rep told me the pt would need to call them to do the new pt enrollment and to give her consent. I called the pt. And gave her [facility] # and asked her to call them. She said she would. I did that b/c [facility] may wait til 01/18/18 to call her.

## 2018-01-18 ENCOUNTER — Telehealth: Payer: Self-pay | Admitting: Internal Medicine

## 2018-01-18 NOTE — Telephone Encounter (Signed)
1 prefilled syringe Ordered date: 01/18/18 Shipping date: 01/18/18

## 2018-01-18 NOTE — Telephone Encounter (Signed)
[facility] called [facility]/c they called me and left a message tcb to sch del.. I did, fasenra is coming in 01/19/18 Called pt and left her a message tcb and ask for me.

## 2018-01-19 ENCOUNTER — Ambulatory Visit: Payer: Medicaid Other | Admitting: Obstetrics

## 2018-01-19 NOTE — Telephone Encounter (Signed)
Arrival Date:01/19/18 Lot #: [id number] Exp date: 01/2019

## 2018-01-20 ENCOUNTER — Ambulatory Visit (INDEPENDENT_AMBULATORY_CARE_PROVIDER_SITE_OTHER): Payer: Medicaid Other

## 2018-01-20 ENCOUNTER — Telehealth: Payer: Self-pay | Admitting: Internal Medicine

## 2018-01-20 DIAGNOSIS — J4551 Severe persistent asthma with (acute) exacerbation: Secondary | ICD-10-CM

## 2018-01-23 NOTE — Telephone Encounter (Signed)
Pt. Was told she would have to wait 2 hrs. And to bring her Epi-Pen and let me see it. She brought her Epi pen, I saw it and asked her if she knew how to use it? "Yes." Pt. Said she could only stay for an hr., I went and ask CY if it was okay for her to wait one hr?, "Yes." No reactions or symptoms. (I checked on the pt. 3 or 4 times, she was able to leave after one hr.. Nothing further needed.

## 2018-01-23 NOTE — Telephone Encounter (Signed)
Routing message to TS to see if she was able to get a hold of pt TS please advise

## 2018-01-23 NOTE — Telephone Encounter (Signed)
Yes, spoke with pt and sch appt. Nothing further needed.

## 2018-01-24 MED ORDER — BENRALIZUMAB 30 MG/ML ~~LOC~~ SOSY
30.0000 mg | PREFILLED_SYRINGE | Freq: Once | SUBCUTANEOUS | Status: AC
  Administered 2018-01-20: 30 mg via SUBCUTANEOUS

## 2018-01-26 ENCOUNTER — Encounter: Payer: Self-pay | Admitting: Obstetrics

## 2018-01-26 ENCOUNTER — Ambulatory Visit (INDEPENDENT_AMBULATORY_CARE_PROVIDER_SITE_OTHER): Payer: Medicaid Other | Admitting: Obstetrics

## 2018-01-26 VITALS — BP 142/80 | HR 82 | Ht 60.0 in | Wt 210.0 lb

## 2018-01-26 DIAGNOSIS — N393 Stress incontinence (female) (male): Secondary | ICD-10-CM

## 2018-01-26 DIAGNOSIS — E28319 Asymptomatic premature menopause: Secondary | ICD-10-CM | POA: Diagnosis not present

## 2018-01-26 DIAGNOSIS — R102 Pelvic and perineal pain: Secondary | ICD-10-CM

## 2018-01-26 NOTE — Progress Notes (Signed)
Patient is in the office to follow up after U/S on 01-09-18.
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
## 2018-02-03 ENCOUNTER — Other Ambulatory Visit: Payer: Self-pay | Admitting: Internal Medicine

## 2018-02-06 NOTE — Telephone Encounter (Signed)
We received pt. First dose. Just need the last two syringes for the next 2 months. Then she'll five to last till 01/2019. (Q 8 wks.) For a total of 7 refills.

## 2018-02-15 ENCOUNTER — Telehealth: Payer: Self-pay | Admitting: Internal Medicine

## 2018-02-15 NOTE — Telephone Encounter (Signed)
1 prefilled syringe Ordered date: 02/15/18 Shipping date: 02/16/18

## 2018-02-17 NOTE — Telephone Encounter (Signed)
Arrival Date:02/17/18 Lot #: [id number] Exp date: 01/2019

## 2018-02-20 ENCOUNTER — Ambulatory Visit: Payer: Medicaid Other

## 2018-02-20 NOTE — Assessment & Plan Note (Signed)
Continues following with cardiology.  Recognize potential symptom overlap.  Cardiology recently assessed and did not feel acute symptoms indicated change in cardiac status.

## 2018-02-27 ENCOUNTER — Telehealth: Payer: Self-pay | Admitting: Acute Care

## 2018-02-27 ENCOUNTER — Ambulatory Visit (INDEPENDENT_AMBULATORY_CARE_PROVIDER_SITE_OTHER): Payer: Medicaid Other

## 2018-02-27 DIAGNOSIS — J4551 Severe persistent asthma with (acute) exacerbation: Secondary | ICD-10-CM | POA: Diagnosis not present

## 2018-02-27 MED ORDER — BENRALIZUMAB 30 MG/ML ~~LOC~~ SOSY
30.0000 mg | PREFILLED_SYRINGE | Freq: Once | SUBCUTANEOUS | Status: AC
  Administered 2018-02-27: 30 mg via SUBCUTANEOUS

## 2018-02-27 NOTE — Telephone Encounter (Signed)
Pt assessed for her Fasenra injection. She has had a cold for the last week without a fever , secretions are clear. Lungs were clear with a few faint wheezes noted. She was afebrile. Injection was given as scheduled.She was instructed to return to the office or seek emergency care if she developed any worsening symptoms. She verbalized understanding.

## 2018-02-27 NOTE — Progress Notes (Signed)
Documentation of medication administration and charges of [name] have been completed by Desmond Dike, CMA based on the [facility] documentation sheet completed by Alroy Bailiff, who administered the medication.

## 2018-02-27 NOTE — Telephone Encounter (Signed)
Pt has had a cold since last Wed. Symptoms: Cough: productive but clear, wheezing. Dr. [name] is off this wk. SG is DOD am. I asked her if it is ok to give shot. She wanted to listen to her First. She accessed her, lungs were clear, ok to give shot. F/u up w/ CY if symptoms worsen.

## 2018-02-27 NOTE — Telephone Encounter (Signed)
Pt waited out in the lobby for 30 mins. I monitored her she had no local reaction, wheezing stopped while she was in the lobby, no cough while she was here. Will route to Judson Roch to make her aware.

## 2018-03-01 ENCOUNTER — Telehealth: Payer: Self-pay | Admitting: Internal Medicine

## 2018-03-01 NOTE — Telephone Encounter (Signed)
Spoke with patient. She stated that she started having some SOB and chest tightness this past Monday. She can was concerned about receiving her Faserna injection but was she cleared. Now since she has had the Faserna injection, her symptoms have gotten worse. It was difficult for her to talk on the phone. Advised her that I did not want her to wait until CY comes back into the office. Was able to get her scheduled to see TP tomorrow at 9am. She verbalized understanding.   Nothing further needed at time of call.

## 2018-03-01 NOTE — Telephone Encounter (Signed)
Pt made aware that CY is not in the office this week and pt has refused an acute apt with APP at this time. Pt is requesting to be worked in on [employer] sched next week when he is back in the office.

## 2018-03-02 ENCOUNTER — Telehealth: Payer: Self-pay | Admitting: Internal Medicine

## 2018-03-02 ENCOUNTER — Ambulatory Visit: Payer: Medicaid Other | Admitting: Adult Health

## 2018-03-02 NOTE — Telephone Encounter (Signed)
Noted  

## 2018-03-07 ENCOUNTER — Other Ambulatory Visit: Payer: Self-pay | Admitting: Internal Medicine

## 2018-03-09 NOTE — Telephone Encounter (Signed)
Pt has one more monthly injection, then maintenance dose q 8 wks.[name]

## 2018-03-13 ENCOUNTER — Ambulatory Visit: Payer: Medicaid Other

## 2018-03-15 ENCOUNTER — Ambulatory Visit: Payer: Medicaid Other | Admitting: Internal Medicine

## 2018-03-16 ENCOUNTER — Encounter: Payer: Self-pay | Admitting: *Deleted

## 2018-03-16 ENCOUNTER — Encounter: Payer: Self-pay | Admitting: Internal Medicine

## 2018-03-16 ENCOUNTER — Ambulatory Visit (INDEPENDENT_AMBULATORY_CARE_PROVIDER_SITE_OTHER): Payer: Medicaid Other | Admitting: Internal Medicine

## 2018-03-16 VITALS — BP 116/72 | HR 79 | Temp 98.1°F | Ht 60.0 in | Wt 204.0 lb

## 2018-03-16 DIAGNOSIS — E662 Morbid (severe) obesity with alveolar hypoventilation: Secondary | ICD-10-CM

## 2018-03-16 DIAGNOSIS — I5042 Chronic combined systolic (congestive) and diastolic (congestive) heart failure: Secondary | ICD-10-CM | POA: Diagnosis not present

## 2018-03-16 DIAGNOSIS — J4551 Severe persistent asthma with (acute) exacerbation: Secondary | ICD-10-CM

## 2018-03-16 MED ORDER — METHYLPREDNISOLONE ACETATE 80 MG/ML IJ SUSP
80.0000 mg | Freq: Once | INTRAMUSCULAR | Status: AC
  Administered 2018-03-16: 80 mg via INTRAMUSCULAR

## 2018-03-16 MED ORDER — PREDNISONE 10 MG PO TABS: ORAL_TABLET | ORAL | 0 refills | Status: DC

## 2018-03-16 MED ORDER — AZITHROMYCIN 250 MG PO TABS: ORAL_TABLET | ORAL | 0 refills | Status: DC

## 2018-03-16 MED ORDER — LEVALBUTEROL HCL 0.63 MG/3ML IN NEBU
0.6300 mg | INHALATION_SOLUTION | Freq: Once | RESPIRATORY_TRACT | Status: AC
  Administered 2018-03-16: 0.63 mg via RESPIRATORY_TRACT
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
## 2018-03-16 NOTE — Patient Instructions (Signed)
Scripts sent for Zpak and prednisone taper  Order- neb xop 0.63   Dx asthma severe persistent exacerbation              Depo 80  We can continue Fasenra  Avoid getting dehydrated  Please call as needed

## 2018-03-16 NOTE — Assessment & Plan Note (Signed)
Not clinically active CHF now but sensitive to fluid status as discussed with her.

## 2018-03-16 NOTE — Assessment & Plan Note (Signed)
Abdominal obesity contributes to increased work of breathing and decreased tidal volume apparent on observation.  Weight loss would help considerably.

## 2018-03-16 NOTE — Assessment & Plan Note (Addendum)
Acute exacerbation including GI symptoms suggests viral infection consistent with start of school year.  1 of her children has brought something home.  We will try to get control and keep her out of the ER. Plan-continue Fasenra, avoid dehydration, neb treatment here Xopenex, Depo-Medrol, prednisone taper, Z-Pak.

## 2018-03-20 ENCOUNTER — Telehealth: Payer: Self-pay | Admitting: Internal Medicine

## 2018-03-20 NOTE — Telephone Encounter (Signed)
1 prefilled syringe Ordered date: 03/20/18 Shipping date: 03/22/18

## 2018-03-23 NOTE — Telephone Encounter (Signed)
Arrival Date:03/23/18 Lot #: [id number] Exp date: 12/2019

## 2018-03-28 ENCOUNTER — Ambulatory Visit (INDEPENDENT_AMBULATORY_CARE_PROVIDER_SITE_OTHER): Payer: Medicaid Other

## 2018-03-28 ENCOUNTER — Other Ambulatory Visit: Payer: Self-pay | Admitting: Internal Medicine

## 2018-03-28 DIAGNOSIS — J4540 Moderate persistent asthma, uncomplicated: Secondary | ICD-10-CM

## 2018-03-28 MED ORDER — BENRALIZUMAB 30 MG/ML ~~LOC~~ SOSY
30.0000 mg | PREFILLED_SYRINGE | Freq: Once | SUBCUTANEOUS | Status: AC
  Administered 2018-03-28: 30 mg via SUBCUTANEOUS

## 2018-03-28 NOTE — Progress Notes (Signed)
Documentation of medication administration and charges of Fasenra have been completed by Rachid Parham, CMA based on the hand written Fasenra documentation sheet completed by Tammy Scott, who administered the medication.  

## 2018-03-28 NOTE — Telephone Encounter (Signed)
CY Please advise for refills. Thanks.

## 2018-03-28 NOTE — Telephone Encounter (Signed)
Ok to refill both. Make sure prednisone has directions for taper.

## 2018-03-29 ENCOUNTER — Telehealth: Payer: Self-pay | Admitting: Internal Medicine

## 2018-03-29 NOTE — Telephone Encounter (Signed)
Called [employer] at [phone] spoke with Gene in Pharmacy department Regarding initiating PA for Epinephrine auto injectors 0.3mg  for patient Epi Pen is approved from 03/29/18 thru 05/28/18 PA [id number] Call reference number [id number] Nothing further needed.

## 2018-04-06 ENCOUNTER — Telehealth: Payer: Self-pay | Admitting: Internal Medicine

## 2018-04-06 NOTE — Telephone Encounter (Signed)
Please let patient know that according to the papers/fax we rec'd-it states her FMLA has been denied as her condition is not an approved need for FMLA. This is something she will need to discuss with her FMLA group. Pt should have rec'd the same letter we did. Thanks.

## 2018-04-06 NOTE — Telephone Encounter (Signed)
Patient returned call, CB is [phone]

## 2018-04-06 NOTE — Telephone Encounter (Signed)
Spoke with pt, advised her that CY has the paperwork and we would call he when everything is completed.  FYI CY

## 2018-04-06 NOTE — Telephone Encounter (Signed)
LMTCB x1 on preferred phone number listed for patient.  

## 2018-04-06 NOTE — Telephone Encounter (Signed)
Advised pt, she states the FMLA paperwork was sent back because it was denied due to needing more information. Some places were missing that needed to be filled out. If he didn't get the paperwork today then CY will receive the papers on Monday. KW please look out for the papers.

## 2018-04-07 NOTE — Telephone Encounter (Signed)
Green folder placed on CY's cart.

## 2018-04-07 NOTE — Telephone Encounter (Signed)
Rec'd new disability forms via interoffice mail from [employer] - fwd to [place] -pr

## 2018-04-11 NOTE — Telephone Encounter (Signed)
Katie please advise on patients paperwork.

## 2018-04-11 NOTE — Telephone Encounter (Signed)
Forms are still located on CY's cart.

## 2018-04-12 NOTE — Telephone Encounter (Signed)
Rec'd completed paperwork - fwd to [employer] via interoffice mail -pr  

## 2018-04-12 NOTE — Telephone Encounter (Signed)
Done

## 2018-04-13 ENCOUNTER — Other Ambulatory Visit: Payer: Self-pay | Admitting: Internal Medicine

## 2018-04-14 ENCOUNTER — Ambulatory Visit (INDEPENDENT_AMBULATORY_CARE_PROVIDER_SITE_OTHER): Payer: Medicaid Other

## 2018-04-14 DIAGNOSIS — Z23 Encounter for immunization: Secondary | ICD-10-CM

## 2018-05-03 ENCOUNTER — Other Ambulatory Visit: Payer: Self-pay | Admitting: Internal Medicine

## 2018-05-03 ENCOUNTER — Other Ambulatory Visit: Payer: Self-pay | Admitting: Obstetrics

## 2018-05-03 DIAGNOSIS — B3731 Acute candidiasis of vulva and vagina: Secondary | ICD-10-CM

## 2018-05-03 DIAGNOSIS — B373 Candidiasis of vulva and vagina: Secondary | ICD-10-CM

## 2018-05-03 NOTE — Telephone Encounter (Signed)
Please review for refill.  

## 2018-05-10 ENCOUNTER — Telehealth: Payer: Self-pay | Admitting: Internal Medicine

## 2018-05-10 NOTE — Telephone Encounter (Signed)
1 prefilled syringe Ordered date: 05/10/18 Shipping date: 05/15/18

## 2018-05-17 ENCOUNTER — Ambulatory Visit: Payer: Medicaid Other

## 2018-05-17 ENCOUNTER — Telehealth: Payer: Self-pay | Admitting: Internal Medicine

## 2018-05-17 ENCOUNTER — Ambulatory Visit (INDEPENDENT_AMBULATORY_CARE_PROVIDER_SITE_OTHER): Payer: Medicaid Other

## 2018-05-17 DIAGNOSIS — J4540 Moderate persistent asthma, uncomplicated: Secondary | ICD-10-CM | POA: Diagnosis not present

## 2018-05-17 MED ORDER — BENRALIZUMAB 30 MG/ML ~~LOC~~ SOSY
30.0000 mg | PREFILLED_SYRINGE | Freq: Once | SUBCUTANEOUS | Status: AC
  Administered 2018-05-17: 30 mg via SUBCUTANEOUS

## 2018-05-17 NOTE — Progress Notes (Signed)
Documented by Parke Poisson CMA based on hand-written [employer] documentation sheet completed by Alroy Bailiff CMA, who administered the medication.

## 2018-05-17 NOTE — Telephone Encounter (Signed)
Medication name and strength: Epinephrime 0.3mg /0.3ml Provider: CY [facility] Patient insurance ID: [id number] O Phone: [phone]  Was the PA started on C[employer]?  yes If yes, please enter the Key: [id number] Timeframe for approval/denial: 3-5 days for determination  Routing message to Joellen Jersey to f/u with PA for patient.

## 2018-05-25 NOTE — Telephone Encounter (Signed)
Because of Q day, making ph calls,  taking ph calls and skypes, seeing pts., and moving the arrival didn't get put in. I thought I had put it in honestly. Arrival Date:05/25/18 Lot #: [id number] Exp date: 12/2019

## 2018-06-20 ENCOUNTER — Other Ambulatory Visit: Payer: Self-pay | Admitting: Cardiology

## 2018-06-27 ENCOUNTER — Ambulatory Visit: Payer: Medicaid Other | Admitting: Internal Medicine

## 2018-06-30 ENCOUNTER — Other Ambulatory Visit: Payer: Self-pay | Admitting: Obstetrics

## 2018-06-30 ENCOUNTER — Ambulatory Visit: Payer: Medicaid Other | Admitting: Cardiovascular Disease

## 2018-06-30 ENCOUNTER — Other Ambulatory Visit: Payer: Self-pay | Admitting: Cardiovascular Disease

## 2018-06-30 DIAGNOSIS — B373 Candidiasis of vulva and vagina: Secondary | ICD-10-CM

## 2018-06-30 DIAGNOSIS — B3731 Acute candidiasis of vulva and vagina: Secondary | ICD-10-CM
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
## 2018-07-04 ENCOUNTER — Other Ambulatory Visit: Payer: Self-pay | Admitting: Internal Medicine

## 2018-07-04 ENCOUNTER — Other Ambulatory Visit: Payer: Self-pay | Admitting: Obstetrics

## 2018-07-04 ENCOUNTER — Ambulatory Visit: Payer: Medicaid Other | Admitting: Cardiovascular Disease

## 2018-07-04 DIAGNOSIS — B373 Candidiasis of vulva and vagina: Secondary | ICD-10-CM

## 2018-07-04 DIAGNOSIS — B3731 Acute candidiasis of vulva and vagina: Secondary | ICD-10-CM

## 2018-07-06 ENCOUNTER — Other Ambulatory Visit: Payer: Self-pay | Admitting: Internal Medicine

## 2018-07-06 MED ORDER — ZOLPIDEM TARTRATE 10 MG PO TABS: 10.0000 mg | ORAL_TABLET | Freq: Every day | ORAL | 5 refills | Status: DC

## 2018-07-10 ENCOUNTER — Telehealth: Payer: Self-pay | Admitting: Internal Medicine

## 2018-07-10 NOTE — Telephone Encounter (Signed)
Called pt she can come in at 11:15 Wed. Morning. I rsc'd her appt.. Will wait for med to come in.

## 2018-07-10 NOTE — Telephone Encounter (Signed)
1 prefilled syringe Ordered date: 07/10/2018 Shipping date: 07/11/2018  I'll call pt.To change her appt.. Her med is coming in the day of her appt.. I will call as soon as I get off the phone with the pharm.[name]
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
## 2018-07-12 ENCOUNTER — Ambulatory Visit: Payer: Medicaid Other

## 2018-07-12 NOTE — Telephone Encounter (Signed)
Arrival Date:07/12/2018 Lot #: [id number] Exp date: 02/2020

## 2018-07-13 ENCOUNTER — Ambulatory Visit (INDEPENDENT_AMBULATORY_CARE_PROVIDER_SITE_OTHER): Payer: Medicaid Other | Admitting: Cardiovascular Disease

## 2018-07-13 ENCOUNTER — Encounter: Payer: Self-pay | Admitting: Cardiovascular Disease

## 2018-07-13 VITALS — BP 142/88 | HR 90 | Ht 60.0 in | Wt 206.4 lb

## 2018-07-13 DIAGNOSIS — I428 Other cardiomyopathies: Secondary | ICD-10-CM | POA: Diagnosis not present

## 2018-07-13 DIAGNOSIS — R079 Chest pain, unspecified: Secondary | ICD-10-CM | POA: Diagnosis not present

## 2018-07-13 DIAGNOSIS — I10 Essential (primary) hypertension: Secondary | ICD-10-CM

## 2018-07-13 MED ORDER — METOPROLOL TARTRATE 25 MG PO TABS: 25.0000 mg | ORAL_TABLET | Freq: Two times a day (BID) | ORAL | 3 refills | Status: DC

## 2018-07-13 MED ORDER — SPIRONOLACTONE 25 MG PO TABS: 25.0000 mg | ORAL_TABLET | Freq: Every day | ORAL | 3 refills | Status: DC

## 2018-07-13 MED ORDER — TORSEMIDE 20 MG PO TABS: 20.0000 mg | ORAL_TABLET | Freq: Two times a day (BID) | ORAL | 3 refills | Status: DC

## 2018-07-13 MED ORDER — LOSARTAN POTASSIUM 100 MG PO TABS: 100.0000 mg | ORAL_TABLET | Freq: Every day | ORAL | 3 refills | Status: DC

## 2018-07-13 MED ORDER — ISOSORBIDE MONONITRATE ER 30 MG PO TB24: 30.0000 mg | ORAL_TABLET | Freq: Every day | ORAL | 3 refills | Status: DC

## 2018-07-13 NOTE — Patient Instructions (Signed)
Medication Instructions:   If you need a refill on your cardiac medications before your next appointment, please call your pharmacy.   Lab work:  If you have labs (blood work) drawn today and your tests are completely normal, you will receive your results only by: [name] MyChart Message (if you have MyChart) OR . A paper copy in the mail If you have any lab test that is abnormal or we need to change your treatment, we will call you to review the results.  Testing/Procedures: Your physician has requested that you have an echocardiogram. Echocardiography is a painless test that uses sound waves to create images of your heart. It provides your doctor with information about the size and shape of your heart and how well your heart's chambers and valves are working. This procedure takes approximately one hour. There are no restrictions for this procedure.  Follow-Up: At [facility], you and your health needs are our priority.  As part of our continuing mission to provide you with exceptional heart care, we have created designated Provider Care Teams.  These Care Teams include your primary Cardiologist (physician) and Advanced Practice Providers (APPs -  Physician Assistants and Nurse Practitioners) who all work together to provide you with the care you need, when you need it. You will need a follow up appointment in 12 months.  Please call our office 2 months in advance to schedule this appointment.  You may see Jenkins Rouge, MD or one of the following Advanced Practice Providers on your designated Care Team:   Truitt Merle, NP Cecilie Kicks, NP . Kathyrn Drown, NP

## 2018-07-14 ENCOUNTER — Ambulatory Visit: Payer: Medicaid Other

## 2018-07-17 ENCOUNTER — Ambulatory Visit (INDEPENDENT_AMBULATORY_CARE_PROVIDER_SITE_OTHER): Payer: Medicaid Other

## 2018-07-17 DIAGNOSIS — J4551 Severe persistent asthma with (acute) exacerbation: Secondary | ICD-10-CM

## 2018-07-20 MED ORDER — BENRALIZUMAB 30 MG/ML ~~LOC~~ SOSY
30.0000 mg | PREFILLED_SYRINGE | Freq: Once | SUBCUTANEOUS | Status: AC
  Administered 2018-07-17: 30 mg via SUBCUTANEOUS

## 2018-07-24 ENCOUNTER — Ambulatory Visit (HOSPITAL_COMMUNITY): Payer: Medicaid Other | Attending: Cardiology

## 2018-07-24 ENCOUNTER — Other Ambulatory Visit: Payer: Self-pay

## 2018-07-24 ENCOUNTER — Other Ambulatory Visit: Payer: Self-pay | Admitting: Internal Medicine

## 2018-07-24 DIAGNOSIS — I428 Other cardiomyopathies: Secondary | ICD-10-CM | POA: Insufficient documentation

## 2018-08-07 ENCOUNTER — Other Ambulatory Visit: Payer: Self-pay | Admitting: Obstetrics

## 2018-08-07 ENCOUNTER — Other Ambulatory Visit: Payer: Self-pay | Admitting: Internal Medicine

## 2018-08-07 DIAGNOSIS — B3731 Acute candidiasis of vulva and vagina: Secondary | ICD-10-CM

## 2018-08-07 DIAGNOSIS — B9689 Other specified bacterial agents as the cause of diseases classified elsewhere: Secondary | ICD-10-CM

## 2018-08-07 DIAGNOSIS — N760 Acute vaginitis: Secondary | ICD-10-CM

## 2018-08-07 DIAGNOSIS — B373 Candidiasis of vulva and vagina: Secondary | ICD-10-CM

## 2018-08-18 ENCOUNTER — Telehealth: Payer: Self-pay | Admitting: General Surgery

## 2018-08-18 NOTE — Telephone Encounter (Signed)
Fax received from covermyeds stating the [employer] was rejected. Checked covermymeds site under Key # [id number], website provided contact # [phone]. Phone # provided belongs to [employer]. Website does not have [employer] available for PA form to be downloaded. Spoke with [employer]. Verbal PA completed under PA # [id number]. Will take 24 hours for approval or denial to be issued.

## 2018-08-21 ENCOUNTER — Ambulatory Visit: Payer: Medicaid Other | Admitting: Internal Medicine

## 2018-08-21 ENCOUNTER — Telehealth: Payer: Self-pay | Admitting: Internal Medicine

## 2018-08-21 NOTE — Telephone Encounter (Signed)
LMTCB

## 2018-08-22 NOTE — Telephone Encounter (Signed)
We have still not received this paper work I have reached out to the patient she will reach out to the company and have them refax it to [place]. Will route message to [facility] to follow up on.

## 2018-08-23 ENCOUNTER — Telehealth: Payer: Self-pay | Admitting: Internal Medicine

## 2018-08-23 NOTE — Telephone Encounter (Signed)
Katie- did you receive the forms?

## 2018-08-23 NOTE — Telephone Encounter (Signed)
Pt is calling back [phone]

## 2018-08-23 NOTE — Telephone Encounter (Signed)
Called and spoke with Vaughan Basta, [employer].  She checked and stated that they have no papers or packets for the Patient at this time.  Called and spoke with Patient.  She stated that she was told that no one at our office was answering the fax.  She stated that she got [facility] pulm fax number from web site.  She was currently driving, so explained for her to call back and ask for correct fax number.  Patient stated understanding.

## 2018-08-23 NOTE — Telephone Encounter (Signed)
Pt is requesting an update on FMLA papers. Cb is [phone].

## 2018-08-23 NOTE — Telephone Encounter (Signed)
Checked with Sharl Ma to see if she had received any papers. She stated that she did not.   Left a message for the patient to call back.

## 2018-08-23 NOTE — Telephone Encounter (Signed)
1 prefilled syringe Ordered date: 08/23/2018 Shipping date: 08/30/2018  Waiting for del. So I can put it in fasenra smart text.

## 2018-08-23 NOTE — Telephone Encounter (Signed)
Left message for patient to call back  

## 2018-08-23 NOTE — Telephone Encounter (Addendum)
Called [employer] at [phone] to f/u with PA request on Anoro Ellipta The Key [id number] has been rejected; PA initiated on 08-18-18 on phone The PA #[id number] on 08/18/2018 Kelly Butler advised that PA was denied because  pt did not try and fail two of the following medications: Stiolto, spiriva, bevespi or albuterol Advised her that pt has tried and failed albuterol and spiriva Initiated PA again over the phone with new info listed above PA is approved now through 08/18/2019 PA #[id number], ref# [id number] Nothing further needed.

## 2018-08-23 NOTE — Telephone Encounter (Signed)
No forms have come across CY's desk-please check with [facility]. Thanks

## 2018-08-24 ENCOUNTER — Ambulatory Visit: Payer: Medicaid Other | Admitting: Nurse Practitioner

## 2018-08-24 NOTE — Telephone Encounter (Signed)
Called and spoke with Patient.  Office fax number given, so she can have FMLA paper work faxed.

## 2018-08-25 ENCOUNTER — Ambulatory Visit: Payer: Medicaid Other | Admitting: Nurse Practitioner

## 2018-08-25 NOTE — Progress Notes (Deleted)
@Patient  ID: Kelly Butler, female    DOB: [birth year], 58 y.o.   MRN: [id number]  No chief complaint on file.   Referring provider: [name], PA-C  HPI 58 year old female former smoker followed for Asthma, OHS/ restriction, Allergic rhinitis, GERD, complicated by GERD, VCD, chronic headache, glaucoma, insomnia, Gr2 Diastolic Dysfunction who is followed by Dr. [name].  Tests:  Office Spirometry 10/08/14- moderate restriction. FVC 1. for 3/60%, FEV1 1.22/61%, FEV1/FVC 0.85, FEF 25-75 percent 2.05/76%. Xolair started 10/29/15, quit 2018 BNP 8/23- 45.3 CBC with differential 8/23-WBC 14,000 with left shift, hemoglobin 10.9 Xolair started 10/29/2015, ended 04/26/17-ineffective Office spirometry 03/21/17- severe restriction and obstruction. FVC 0.76/32%, FEV1 0.65/34%, ratio 0.85, FEF 25-75% 1.03/50% Echocardiogram 04/08/17- EF 45-50 percent, hypokinesis, grade 2 diastolic dysfunction, PAs 38 mmHg PFT 04/18/17- severe restriction, increased diffusion for alveolar ventilation. FVC 0.7/35%, FEV1 0.84/42%, ratio 0.97, FEF 25-75% 2.04/98%, TLC 51%, DLCO 72% no response to bronchodilator FENO 12/14/2017-16-WNL Fasenra started 03/09/18 IgE 12/14/17- 2,497   EOS 3%.  Benralizumab SOSY 30 mg    Date Action Dose Route User   07/17/2018 1150 Given 30 mg Subcutaneous (Left Arm) Scott, Tammy B         Allergies  Allergen Reactions  . Celebrex [Celecoxib] Swelling and Rash  . Pregabalin Other (See Comments)    *LYRICA* REACTION: itching, swelling *LYRICA* REACTION: itching, swelling  . Sulfa Antibiotics Other (See Comments)    Immunization History  Administered Date(s) Administered  . Influenza Split 04/11/2012  . Influenza Whole 03/27/2009, 04/07/2010, 03/10/2011  . Influenza,inj,Quad PF,6+ Mos 05/01/2013, 04/08/2014, 04/09/2015, 03/31/2016, 03/04/2017  . Pneumococcal Polysaccharide-23 04/01/2009, 03/20/2010, 04/14/2018  . Pneumococcal-Unspecified 04/01/2009, 03/20/2010  . Tdap 07/18/2013     Past Medical History:  Diagnosis Date  . Abnormal heart rhythm   . Allergic rhinitis    skin test POS 11/08/08  . Asthma   . Disorder of vocal cord   . Fibromyalgia   . GERD (gastroesophageal reflux disease)   . Hypertension   . Sleep apnea    questionable    Tobacco History: Social History   Tobacco Use  Smoking Status Former Smoker  . Packs/day: 0.30  . Years: 6.00  . Pack years: 1.80  . Last attempt to quit: 07/05/1989  . Years since quitting: 29.1  Smokeless Tobacco Never Used   Counseling given: Not Answered   Outpatient Encounter Medications as of 08/28/2018  Medication Sig  . albuterol (PROAIR HFA) 108 (90 Base) MCG/ACT inhaler TAKE 2 PUFFS EVERY 4 HOURS AS NEEDED -RESCUE  . ALPHAGAN P 0.15 % ophthalmic solution Place 1 drop into both eyes 2 (two) times daily.  [name] ALPRAZolam (XANAX) 0.5 MG tablet Take 0.5 mg by mouth 3 (three) times daily as needed (anxiety). Anxiety.  Jearl Klinefelter ELLIPTA 62.5-25 MCG/INH AEPB INHALE 1 PUFF INTO THE LUNGS DAILY.  . cyclobenzaprine (FLEXERIL) 5 MG tablet Take 1 tablet (5 mg total) by mouth 3 (three) times daily as needed for muscle spasms.  [name] EPINEPHrine 0.3 mg/0.3 mL IJ SOAJ injection Inject into the thigh once for severe allergic reaction.  [name] FASENRA 30 MG/ML SOSY THIRD SHIP: INJECT ONE SYRINGE SUBCUTANEOUSLY ON WEEK 8, THEN EVERY 8WEEKS THEREAFTER.  . fluconazole (DIFLUCAN) 150 MG tablet TAKE 1 TABLET BY MOUTH AS ONE DOSE  . ipratropium-albuterol (DUONEB) 0.5-2.5 (3) MG/3ML SOLN USE 1 VIAL VIA NEBULIZER EVERY 4 HRS AS NEEDED  . isosorbide mononitrate (IMDUR) 30 MG 24 hr tablet Take 1 tablet (30 mg total) by mouth daily.  [name] latanoprost (  XALATAN) 0.005 % ophthalmic solution Place 1 drop into both eyes at bedtime.  [name] LATUDA 40 MG TABS tablet Take 20 mg by mouth every evening.  [name] losartan (COZAAR) 100 MG tablet Take 1 tablet (100 mg total) by mouth daily.  . methocarbamol (ROBAXIN) 500 MG tablet Take 1 tablet (500 mg total) by mouth 2  (two) times daily. May cause drowsiness  . metoprolol tartrate (LOPRESSOR) 25 MG tablet Take 1 tablet (25 mg total) by mouth 2 (two) times daily.  . metroNIDAZOLE (METROGEL) 0.75 % vaginal gel PLACE 1 APPLICATORFUL VAGINALLY 2 (TWO) TIMES DAILY.  . montelukast (SINGULAIR) 10 MG tablet TAKE 1 TABLET (10 MG TOTAL) BY MOUTH EVERY MORNING.  [name] omeprazole (PRILOSEC) 40 MG capsule Take 40 mg by mouth 2 (two) times daily.    . ondansetron (ZOFRAN) 4 MG tablet Take 4 mg by mouth every 8 (eight) hours as needed for nausea or vomiting.   [name] spironolactone (ALDACTONE) 25 MG tablet Take 1 tablet (25 mg total) by mouth daily.  [name] torsemide (DEMADEX) 20 MG tablet Take 1 tablet (20 mg total) by mouth 2 (two) times daily.  [name] zolpidem (AMBIEN) 10 MG tablet Take 1 tablet (10 mg total) by mouth at bedtime.   No facility-administered encounter medications on file as of 08/28/2018.      Review of Systems  Review of Systems     Physical Exam  There were no vitals taken for this visit.  Wt Readings from Last 5 Encounters:  07/13/18 206 lb 6.4 oz (93.6 kg)  03/16/18 204 lb (92.5 kg)  01/26/18 210 lb (95.3 kg)  01/09/18 210 lb (95.3 kg)  01/03/18 214 lb 6.4 oz (97.3 kg)     Physical Exam   Lab Results:  CBC    Component Value Date/Time   WBC 12.5 (H) 12/14/2017 0929   RBC 3.81 (L) 12/14/2017 0929   HGB 10.8 (L) 12/14/2017 0929   HCT 31.5 (L) 12/14/2017 0929   PLT 309.0 12/14/2017 0929   MCV 82.8 12/14/2017 0929   MCH 26.5 06/17/2017 0810   MCHC 34.2 12/14/2017 0929   RDW 14.9 12/14/2017 0929   LYMPHSABS 3.3 12/14/2017 0929   MONOABS 0.5 12/14/2017 0929   EOSABS 0.4 12/14/2017 0929   BASOSABS 0.0 12/14/2017 0929    BMET    Component Value Date/Time   NA 139 11/04/2017 0801   K 3.5 11/04/2017 0801   CL 96 11/04/2017 0801   CO2 27 11/04/2017 0801   GLUCOSE 166 (H) 11/04/2017 0801   GLUCOSE 140 (H) 02/24/2017 2201   BUN 26 (H) 11/04/2017 0801   CREATININE 1.20 (H) 11/04/2017 0801    CALCIUM 9.8 11/04/2017 0801   GFRNONAA 51 (L) 11/04/2017 0801   GFRAA 59 (L) 11/04/2017 0801    BNP    Component Value Date/Time   BNP 45.3 02/24/2017 2201    ProBNP    Component Value Date/Time   PROBNP 13 06/07/2017 0901   PROBNP <5.0 08/23/2012 1425    Imaging: No results found.   Assessment & Plan:   No problem-specific Assessment & Plan notes found for this encounter.     Fenton Foy, NP 08/25/2018

## 2018-08-28 ENCOUNTER — Ambulatory Visit: Payer: Medicaid Other | Admitting: Nurse Practitioner

## 2018-08-28 NOTE — Telephone Encounter (Signed)
Kelly Butler was given forms on Friday 08-25-2018 to complete. Kelly Butler please provide update on where papers are. Thanks.

## 2018-08-28 NOTE — Telephone Encounter (Signed)
I did not receive this paper to be faxed.

## 2018-08-28 NOTE — Telephone Encounter (Signed)
Kelly Butler please advise. Thanks

## 2018-08-28 NOTE — Telephone Encounter (Signed)
That form was filled out. I think nurse yesterday or this morning sent it to be faxed.

## 2018-08-28 NOTE — Telephone Encounter (Signed)
Have these forms been received? Thanks. 

## 2018-08-29 NOTE — Telephone Encounter (Signed)
I never received this form and is not in CY look at.

## 2018-08-29 NOTE — Telephone Encounter (Signed)
Called and spoke with [employer]. They have not received short term disability forms since 04/2018.  Forms are not in scan at this time. Dr [name] stated that he has signed forms, and gave them to be faxed.    Message routed to Nurses recently with [facility]-Heather, Zola Button, and Patrice to check on forms

## 2018-08-31 NOTE — Telephone Encounter (Signed)
Arrival Date:08/31/2018 Lot #: [id number] Exp date: 03/2020

## 2018-09-04 NOTE — Telephone Encounter (Signed)
Called and spoke with Patient.  Per CY, short term disability form was given and signed by Dr [name], 08/28/18.  Patient is checking to see if form was received and if anything further is needed.

## 2018-09-05 NOTE — Telephone Encounter (Signed)
Lmom to follow up with the patient.

## 2018-09-06 ENCOUNTER — Telehealth: Payer: Self-pay | Admitting: Internal Medicine

## 2018-09-06 NOTE — Telephone Encounter (Signed)
Spoke with the pt  She is aware forms done 08/28/2018  She states there is nothing further needed

## 2018-09-06 NOTE — Telephone Encounter (Signed)
I'm off 09/11/2018, pt was scheduled for 8:45 that morning. TD came to me and asked if I would call the pt and see if she could come in 09/12/2018. Pt said she has another appt. That day, could I do 09/13/2018. "Yes, ma'am." I scheduled her for (Wed.) 09/13/2018 at 10:15. Nothing further needed.

## 2018-09-07 ENCOUNTER — Ambulatory Visit: Payer: Medicaid Other | Admitting: Nurse Practitioner

## 2018-09-11 ENCOUNTER — Ambulatory Visit: Payer: Medicaid Other

## 2018-09-13 ENCOUNTER — Other Ambulatory Visit: Payer: Self-pay

## 2018-09-13 ENCOUNTER — Ambulatory Visit (INDEPENDENT_AMBULATORY_CARE_PROVIDER_SITE_OTHER): Payer: Medicaid Other

## 2018-09-13 DIAGNOSIS — J4551 Severe persistent asthma with (acute) exacerbation: Secondary | ICD-10-CM

## 2018-09-13 MED ORDER — BENRALIZUMAB 30 MG/ML ~~LOC~~ SOSY
30.0000 mg | PREFILLED_SYRINGE | Freq: Once | SUBCUTANEOUS | Status: AC
  Administered 2018-09-13: 30 mg via SUBCUTANEOUS

## 2018-09-26 IMAGING — US US PELVIS COMPLETE TRANSABD/TRANSVAG
1 series · 15 of 25 positions shown · non-contrast
Comparison: 06/14/2014 pelvic sonogram.

CLINICAL DATA: 57-year-old postmenopausal female with lower
abdominal pain bilaterally, reportedly chronic.

EXAM:
TRANSABDOMINAL AND TRANSVAGINAL ULTRASOUND OF PELVIS
TECHNIQUE: Both transabdominal and transvaginal ultrasound examinations of the
pelvis were performed. Transabdominal technique was performed for
global imaging of the pelvis including uterus, ovaries, adnexal
regions, and pelvic cul-de-sac. It was necessary to proceed with
endovaginal exam following the transabdominal exam to visualize the
endometrium and adnexa.

[Series 1: us pelvis complete transabd/transvag · 15 of 36 slices shown]
[im 1/36]
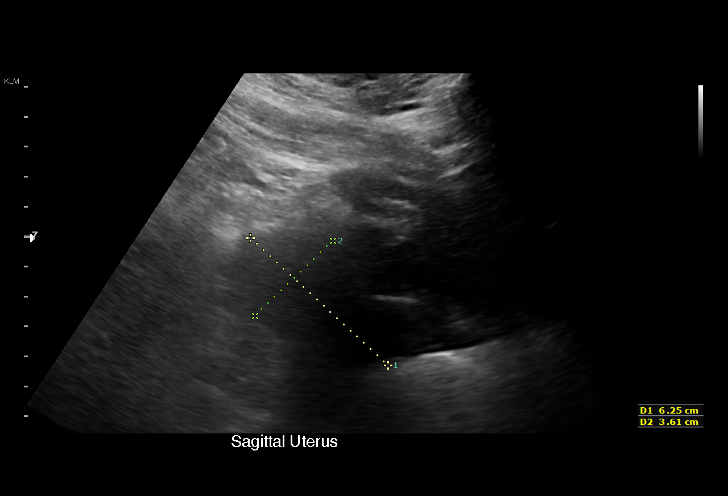
[im 3/36]
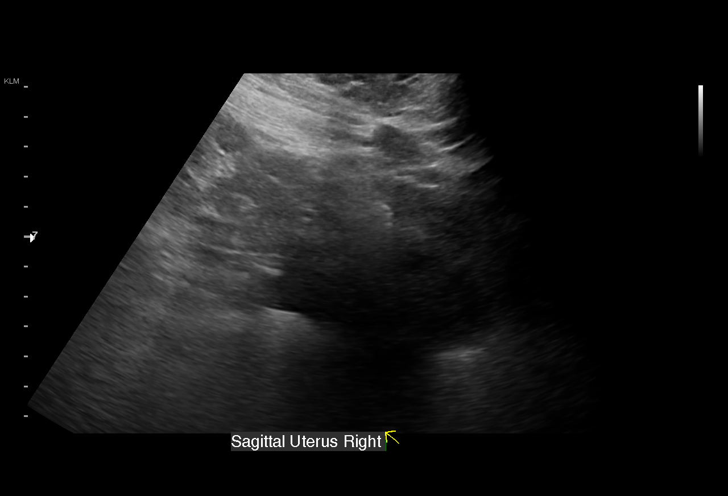
[im 6/36]
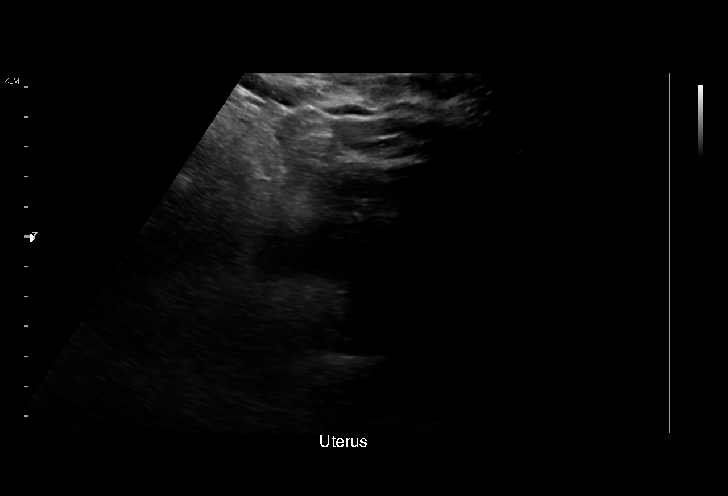
[im 8/36]
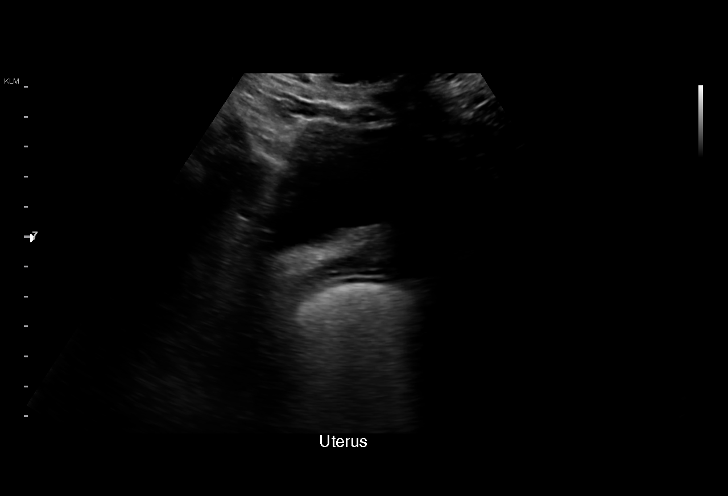
[im 11/36]
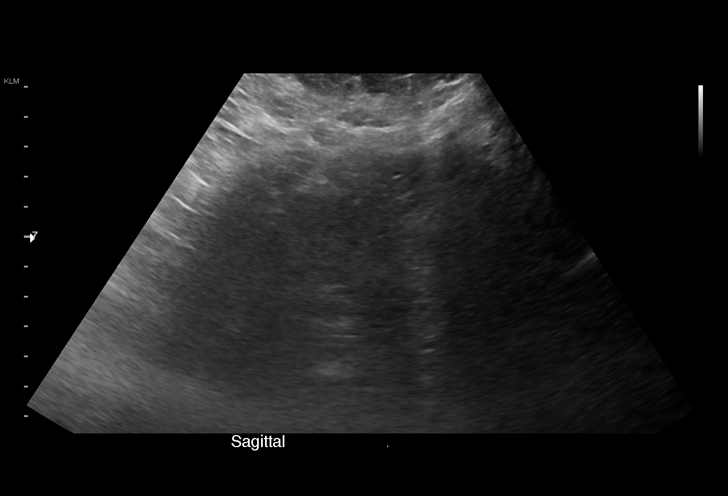
[im 14/36]
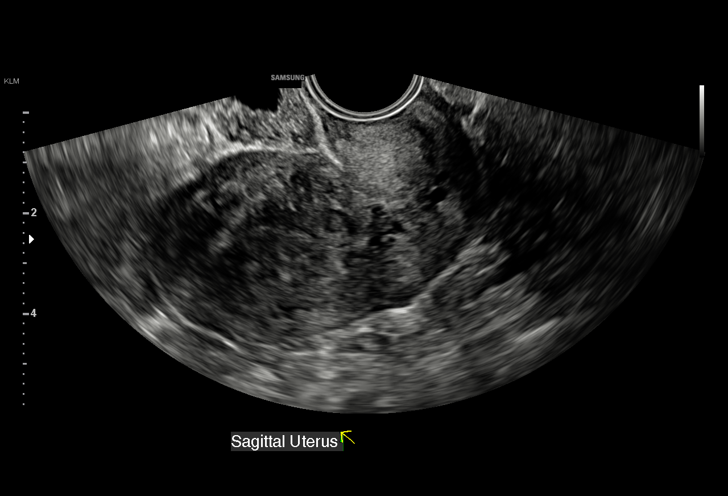
[im 15/36]
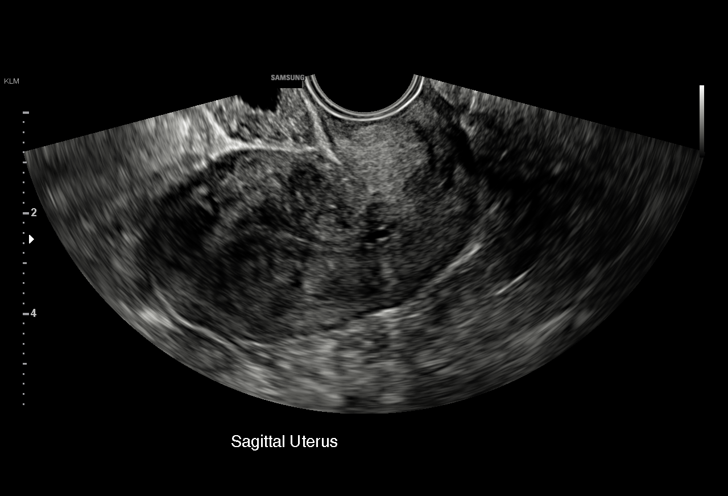
[im 18/36]
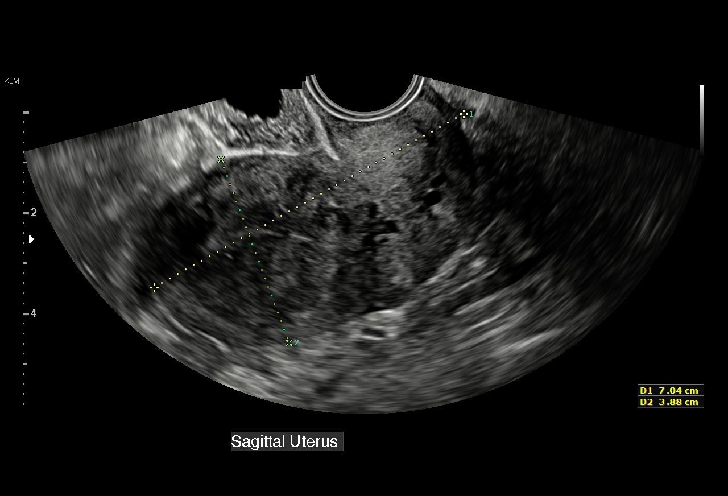
[im 21/36]
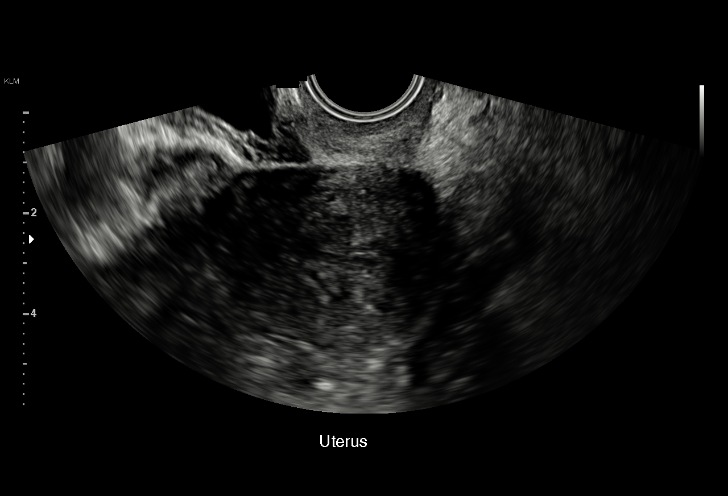
[im 22/36]
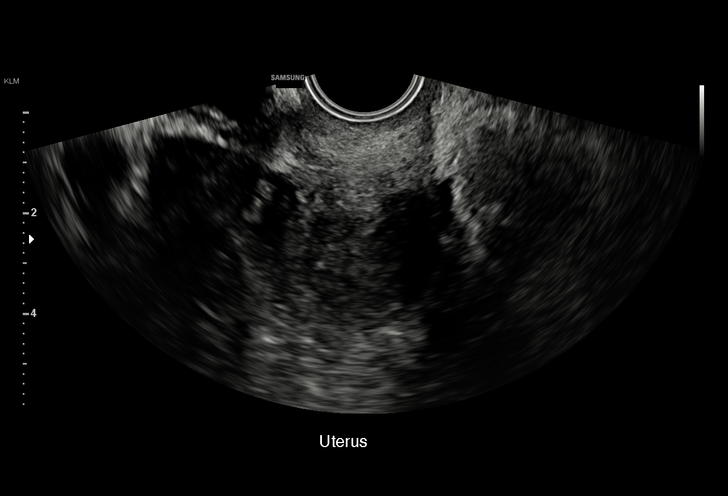
[im 25/36]
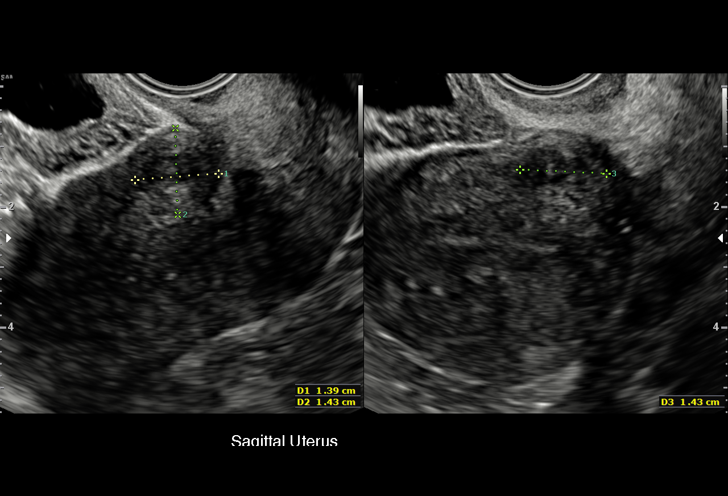
[im 28/36]
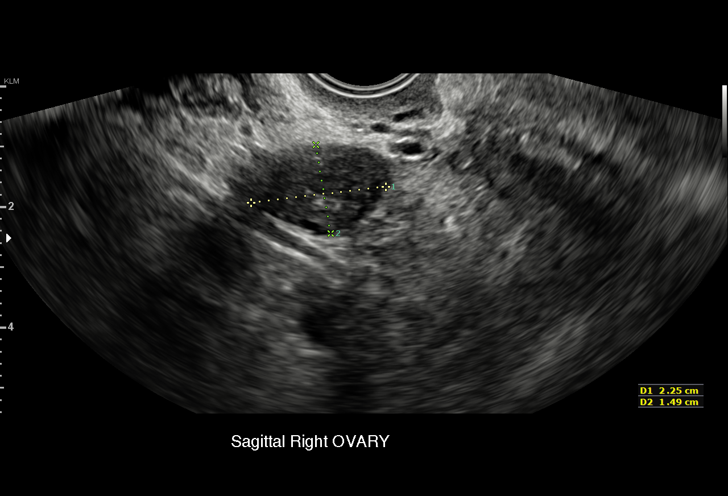
[im 30/36]
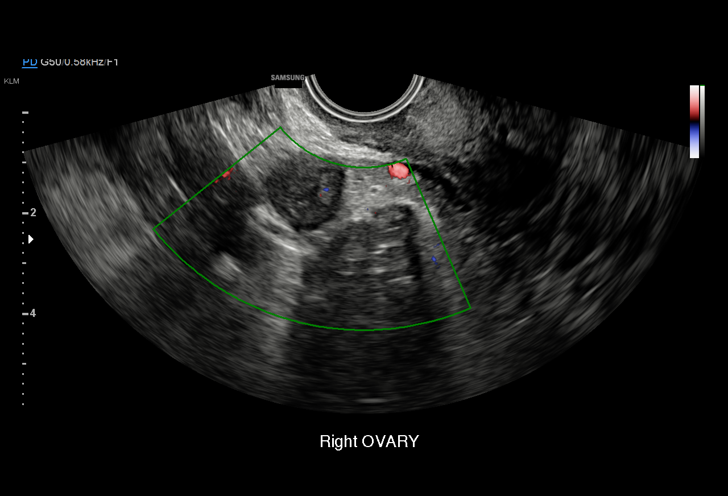
[im 33/36]
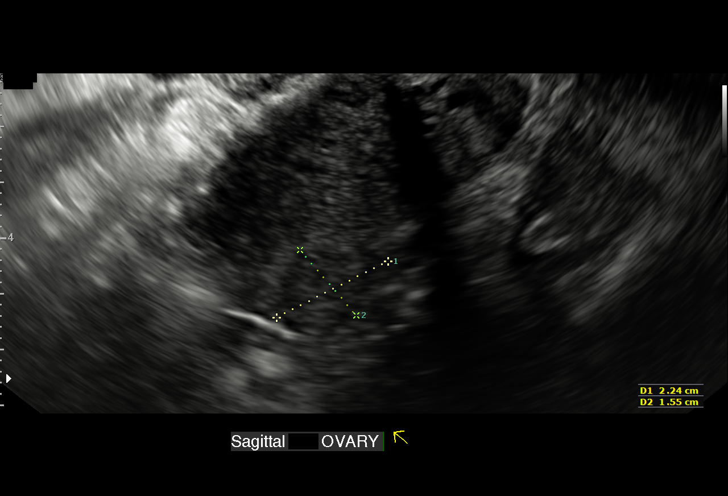
[im 36/36]
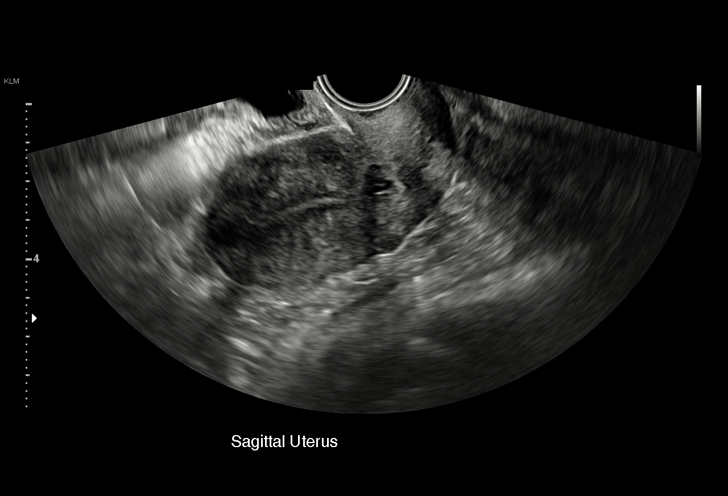

[15 of 25 positions shown; findings below may reference images not displayed]

FINDINGS: Uterus

Measurements: 7.0 x 3.9 x 3.9 cm. Anteverted anteflexed uterus is
normal in size and configuration. Intramural 1.4 x 1.4 x 1.4 cm left
anterior uterine body fibroid, previously 0.8 x 0.6 x 0.8 cm, mildly
increased.

Endometrium

Thickness: 3 mm.  No focal abnormality visualized.

Right ovary

Measurements: 2.3 x 1.5 x 1.4 cm. Normal appearance/no adnexal mass.

Left ovary

Measurements: 2.2 x 1.6 x 1.1 cm. Normal appearance/no adnexal mass.

Other findings

No abnormal free fluid.
IMPRESSION: 1. Small 1.4 cm intramural left anterior uterine body fibroid,
mildly increased in size since 2685 sonogram. Suggest continued
sonographic surveillance for this fibroid to ensure absence of
continued growth in this patient who is now reportedly
postmenopausal.
2. Thin endometrium (3 mm).  No focal endometrial abnormality.
3. No ovarian or adnexal masses.  No free fluid.

## 2018-10-16 ENCOUNTER — Encounter: Payer: Self-pay | Admitting: *Deleted

## 2018-10-19 ENCOUNTER — Other Ambulatory Visit: Payer: Self-pay | Admitting: Internal Medicine

## 2018-10-19 ENCOUNTER — Other Ambulatory Visit: Payer: Self-pay | Admitting: Obstetrics

## 2018-10-19 DIAGNOSIS — B3731 Acute candidiasis of vulva and vagina: Secondary | ICD-10-CM

## 2018-10-19 DIAGNOSIS — B373 Candidiasis of vulva and vagina: Secondary | ICD-10-CM

## 2018-10-19 DIAGNOSIS — N760 Acute vaginitis: Secondary | ICD-10-CM

## 2018-10-19 DIAGNOSIS — B9689 Other specified bacterial agents as the cause of diseases classified elsewhere: Secondary | ICD-10-CM

## 2018-11-01 ENCOUNTER — Other Ambulatory Visit: Payer: Self-pay | Admitting: Obstetrics

## 2018-11-01 ENCOUNTER — Telehealth: Payer: Self-pay | Admitting: Internal Medicine

## 2018-11-01 DIAGNOSIS — B373 Candidiasis of vulva and vagina: Secondary | ICD-10-CM

## 2018-11-01 DIAGNOSIS — B9689 Other specified bacterial agents as the cause of diseases classified elsewhere: Secondary | ICD-10-CM

## 2018-11-01 DIAGNOSIS — N760 Acute vaginitis: Principal | ICD-10-CM

## 2018-11-01 DIAGNOSIS — B3731 Acute candidiasis of vulva and vagina: Secondary | ICD-10-CM

## 2018-11-01 MED ORDER — PREDNISONE 10 MG PO TABS: ORAL_TABLET | ORAL | 0 refills | Status: DC

## 2018-11-01 NOTE — Telephone Encounter (Signed)
Offer prednisone taper 10 mg, # 20, 4 X 2 DAYS, 3 X 2 DAYS, 2 X 2 DAYS, 1 X 2 DAYS  

## 2018-11-01 NOTE — Telephone Encounter (Signed)
Called and spoke with pt stating to her the info per CY and stated to her that we were going to send pred taper Rx to pharmacy for her. Pt expressed understanding. I verified pt's preferred pharmacy and sent med in for pt. I also stated to pt that I spoke with TS in regards to her fasenra and per TS, pt could move up her appt which is currently scheduled 5/11 to 5/6. Pt expressed understanding and stated she would like that to be done. Pt is now scheduled for Fasenra injection 5/6 at 10:30 and TS was also made aware that this was done. Nothing further needed.

## 2018-11-01 NOTE — Telephone Encounter (Signed)
Called and spoke with Kelly Butler who has complaints of increased SOB and a cough x1 week. Kelly Butler stated that she is not coughing up any mucus. Due to the cough, Kelly Butler does also have complaints of right side pain. Kelly Butler also has complaints of head and chest congestion. Kelly Butler has had to use her rescue inhaler three to four times daily and has been using her nebulizer every four hours.  Kelly Butler denies any complaints of fever or chest tightness.  Kelly Butler is requesting something to be prescribed to help with her symptoms.  Dr. [name], please advise on this for Kelly Butler. Thanks.  Allergies  Allergen Reactions  . Celebrex [Celecoxib] Swelling and Rash  . Pregabalin Other (See Comments)    *LYRICA* REACTION: itching, swelling *LYRICA* REACTION: itching, swelling  . Sulfa Antibiotics Other (See Comments)     Current Outpatient Medications:  .  albuterol (PROAIR HFA) 108 (90 Base) MCG/ACT inhaler, TAKE 2 PUFFS EVERY 4 HOURS AS NEEDED -RESCUE, Disp: 8.5 Inhaler, Rfl: 11 .  ALPHAGAN P 0.15 % ophthalmic solution, Place 1 drop into both eyes 2 (two) times daily., Disp: , Rfl: 0 .  ALPRAZolam (XANAX) 0.5 MG tablet, Take 0.5 mg by mouth 3 (three) times daily as needed (anxiety). Anxiety., Disp: , Rfl:  .  ANORO ELLIPTA 62.5-25 MCG/INH AEPB, INHALE 1 PUFF INTO THE LUNGS DAILY., Disp: 60 each, Rfl: 12 .  cyclobenzaprine (FLEXERIL) 5 MG tablet, Take 1 tablet (5 mg total) by mouth 3 (three) times daily as needed for muscle spasms., Disp: 30 tablet, Rfl: 0 .  EPINEPHrine 0.3 mg/0.3 mL IJ SOAJ injection, Inject into the thigh once for severe allergic reaction., Disp: 1 Device, Rfl: 5 .  FASENRA 30 MG/ML SOSY, THIRD SHIP: INJECT ONE SYRINGE SUBCUTANEOUSLY ON WEEK 8, THEN EVERY 8WEEKS THEREAFTER., Disp: 1 Syringe, Rfl: 0 .  fluconazole (DIFLUCAN) 150 MG tablet, TAKE 1 TABLET BY MOUTH AS ONE DOSE, Disp: 1 tablet, Rfl: 0 .  ipratropium-albuterol (DUONEB) 0.5-2.5 (3) MG/3ML SOLN, USE 1 VIAL VIA NEBULIZER EVERY 4 HRS AS NEEDED, Disp: 360 mL, Rfl: 2 .   isosorbide mononitrate (IMDUR) 30 MG 24 hr tablet, Take 1 tablet (30 mg total) by mouth daily., Disp: 90 tablet, Rfl: 3 .  latanoprost (XALATAN) 0.005 % ophthalmic solution, Place 1 drop into both eyes at bedtime., Disp: , Rfl: 0 .  LATUDA 40 MG TABS tablet, Take 20 mg by mouth every evening., Disp: , Rfl: 1 .  losartan (COZAAR) 100 MG tablet, Take 1 tablet (100 mg total) by mouth daily., Disp: 90 tablet, Rfl: 3 .  methocarbamol (ROBAXIN) 500 MG tablet, Take 1 tablet (500 mg total) by mouth 2 (two) times daily. May cause drowsiness, Disp: 20 tablet, Rfl: 0 .  metoprolol tartrate (LOPRESSOR) 25 MG tablet, Take 1 tablet (25 mg total) by mouth 2 (two) times daily., Disp: 180 tablet, Rfl: 3 .  metroNIDAZOLE (METROGEL) 0.75 % vaginal gel, PLACE 1 APPLICATORFUL VAGINALLY 2 (TWO) TIMES DAILY., Disp: 70 g, Rfl: 0 .  montelukast (SINGULAIR) 10 MG tablet, TAKE 1 TABLET (10 MG TOTAL) BY MOUTH EVERY MORNING., Disp: 30 tablet, Rfl: 9 .  omeprazole (PRILOSEC) 40 MG capsule, Take 40 mg by mouth 2 (two) times daily.  , Disp: , Rfl:  .  ondansetron (ZOFRAN) 4 MG tablet, Take 4 mg by mouth every 8 (eight) hours as needed for nausea or vomiting. , Disp: , Rfl: 5 .  spironolactone (ALDACTONE) 25 MG tablet, Take 1 tablet (25 mg total) by mouth daily., Disp:  90 tablet, Rfl: 3 .  torsemide (DEMADEX) 20 MG tablet, Take 1 tablet (20 mg total) by mouth 2 (two) times daily., Disp: 180 tablet, Rfl: 3 .  zolpidem (AMBIEN) 10 MG tablet, Take 1 tablet (10 mg total) by mouth at bedtime., Disp: 30 tablet, Rfl: 5

## 2018-11-02 ENCOUNTER — Telehealth: Payer: Self-pay | Admitting: *Deleted

## 2018-11-02 NOTE — Telephone Encounter (Signed)
[name] Order: 30mg  #1 prefilled syringe Ordered date: 11/02/2018 Expected date of arrival: 11/07/2018 Ordered by: [place]: [facility]  Spoke with [place] she said pt needs a P/A. Pt has Medicaid so you'll need to call or print P/A off of [employer] website. Sending to inj pool as FYI She went ahead and set up delivery as u can see.

## 2018-11-03 ENCOUNTER — Telehealth: Payer: Self-pay | Admitting: Cardiovascular Disease

## 2018-11-03 NOTE — Telephone Encounter (Signed)
Pharmacy called to verify that the metoprolol tartrate (LOPRESSOR) 25 MG tablet is safe for the patient to be on.  The pharmacy has a flag come up on the medication because the patient states she asthmatic.  The order is new to this pharmacy, even though the record states the pt as been on the med since January.

## 2018-11-03 NOTE — Telephone Encounter (Signed)
Called pharmacy to let them know that patient has been on Metoprolol for a year with no complaints. Pharmacy stated insurance has to just check with the provider and pharmacy before they will cover medication.

## 2018-11-03 NOTE — Telephone Encounter (Signed)
PA form has been filled out and faxed to [employer] along with supporting clinical notes. Will await PA decision.

## 2018-11-07 ENCOUNTER — Other Ambulatory Visit: Payer: Self-pay

## 2018-11-07 ENCOUNTER — Ambulatory Visit (INDEPENDENT_AMBULATORY_CARE_PROVIDER_SITE_OTHER): Payer: Medicaid Other

## 2018-11-07 DIAGNOSIS — J4551 Severe persistent asthma with (acute) exacerbation: Secondary | ICD-10-CM

## 2018-11-07 MED ORDER — BENRALIZUMAB 30 MG/ML ~~LOC~~ SOSY
30.0000 mg | PREFILLED_SYRINGE | Freq: Once | SUBCUTANEOUS | Status: AC
  Administered 2018-11-07: 30 mg via SUBCUTANEOUS

## 2018-11-07 NOTE — Progress Notes (Signed)
Have you been hospitalized within the last 10 days?  No Do you have a fever?  No Do you have a cough?  No Do you have a headache or sore throat? No  

## 2018-11-07 NOTE — Telephone Encounter (Signed)
PA has been approved.  [name] Shipment Received:  30mg  #1 prefilled syringe Medication arrival Date: 11/07/2018 Lot #: [id number] Medication exp date: 03/2020 Received by: Desmond Dike, CMA

## 2018-11-08 ENCOUNTER — Other Ambulatory Visit: Payer: Self-pay | Admitting: Internal Medicine

## 2018-11-08 ENCOUNTER — Ambulatory Visit: Payer: Medicaid Other

## 2018-11-08 ENCOUNTER — Other Ambulatory Visit: Payer: Self-pay | Admitting: Obstetrics

## 2018-11-08 DIAGNOSIS — B3731 Acute candidiasis of vulva and vagina: Secondary | ICD-10-CM

## 2018-11-08 DIAGNOSIS — B373 Candidiasis of vulva and vagina: Secondary | ICD-10-CM

## 2018-11-08 DIAGNOSIS — B9689 Other specified bacterial agents as the cause of diseases classified elsewhere: Secondary | ICD-10-CM

## 2018-11-08 DIAGNOSIS — N760 Acute vaginitis: Secondary | ICD-10-CM

## 2018-11-08 NOTE — Telephone Encounter (Signed)
Pt requesting a refill of pred taper. Dr. [name], please advise if you are okay with refilling med for pt. Thanks!  Allergies  Allergen Reactions  . Celebrex [Celecoxib] Swelling and Rash  . Pregabalin Other (See Comments)    *LYRICA* REACTION: itching, swelling *LYRICA* REACTION: itching, swelling  . Sulfa Antibiotics Other (See Comments)     Current Outpatient Medications:  .  albuterol (PROAIR HFA) 108 (90 Base) MCG/ACT inhaler, TAKE 2 PUFFS EVERY 4 HOURS AS NEEDED -RESCUE, Disp: 8.5 Inhaler, Rfl: 11 .  ALPHAGAN P 0.15 % ophthalmic solution, Place 1 drop into both eyes 2 (two) times daily., Disp: , Rfl: 0 .  ALPRAZolam (XANAX) 0.5 MG tablet, Take 0.5 mg by mouth 3 (three) times daily as needed (anxiety). Anxiety., Disp: , Rfl:  .  ANORO ELLIPTA 62.5-25 MCG/INH AEPB, INHALE 1 PUFF INTO THE LUNGS DAILY., Disp: 60 each, Rfl: 12 .  cyclobenzaprine (FLEXERIL) 5 MG tablet, Take 1 tablet (5 mg total) by mouth 3 (three) times daily as needed for muscle spasms., Disp: 30 tablet, Rfl: 0 .  EPINEPHrine 0.3 mg/0.3 mL IJ SOAJ injection, Inject into the thigh once for severe allergic reaction., Disp: 1 Device, Rfl: 5 .  FASENRA 30 MG/ML SOSY, THIRD SHIP: INJECT ONE SYRINGE SUBCUTANEOUSLY ON WEEK 8, THEN EVERY 8WEEKS THEREAFTER., Disp: 1 Syringe, Rfl: 0 .  fluconazole (DIFLUCAN) 150 MG tablet, TAKE 1 TABLET BY MOUTH AS ONE DOSE, Disp: 1 tablet, Rfl: 0 .  ipratropium-albuterol (DUONEB) 0.5-2.5 (3) MG/3ML SOLN, USE 1 VIAL VIA NEBULIZER EVERY 4 HRS AS NEEDED, Disp: 360 mL, Rfl: 2 .  isosorbide mononitrate (IMDUR) 30 MG 24 hr tablet, Take 1 tablet (30 mg total) by mouth daily., Disp: 90 tablet, Rfl: 3 .  latanoprost (XALATAN) 0.005 % ophthalmic solution, Place 1 drop into both eyes at bedtime., Disp: , Rfl: 0 .  LATUDA 40 MG TABS tablet, Take 20 mg by mouth every evening., Disp: , Rfl: 1 .  losartan (COZAAR) 100 MG tablet, Take 1 tablet (100 mg total) by mouth daily., Disp: 90 tablet, Rfl: 3 .   methocarbamol (ROBAXIN) 500 MG tablet, Take 1 tablet (500 mg total) by mouth 2 (two) times daily. May cause drowsiness, Disp: 20 tablet, Rfl: 0 .  metoprolol tartrate (LOPRESSOR) 25 MG tablet, Take 1 tablet (25 mg total) by mouth 2 (two) times daily., Disp: 180 tablet, Rfl: 3 .  metroNIDAZOLE (METROGEL) 0.75 % vaginal gel, PLACE 1 APPLICATORFUL VAGINALLY 2 (TWO) TIMES DAILY., Disp: 70 g, Rfl: 0 .  montelukast (SINGULAIR) 10 MG tablet, TAKE 1 TABLET (10 MG TOTAL) BY MOUTH EVERY MORNING., Disp: 30 tablet, Rfl: 9 .  omeprazole (PRILOSEC) 40 MG capsule, Take 40 mg by mouth 2 (two) times daily.  , Disp: , Rfl:  .  ondansetron (ZOFRAN) 4 MG tablet, Take 4 mg by mouth every 8 (eight) hours as needed for nausea or vomiting. , Disp: , Rfl: 5 .  predniSONE (DELTASONE) 10 MG tablet, Take 4tabsx2days, 3tabsx2days, 2tabsx2days, 1tabx2days, then stop, Disp: 20 tablet, Rfl: 0 .  spironolactone (ALDACTONE) 25 MG tablet, Take 1 tablet (25 mg total) by mouth daily., Disp: 90 tablet, Rfl: 3 .  torsemide (DEMADEX) 20 MG tablet, Take 1 tablet (20 mg total) by mouth 2 (two) times daily., Disp: 180 tablet, Rfl: 3 .  zolpidem (AMBIEN) 10 MG tablet, Take 1 tablet (10 mg total) by mouth at bedtime., Disp: 30 tablet, Rfl: 5

## 2018-11-09 ENCOUNTER — Ambulatory Visit: Payer: Medicaid Other | Admitting: Internal Medicine

## 2018-11-09 NOTE — Telephone Encounter (Signed)
Prednisone refill was e-sent

## 2018-11-13 ENCOUNTER — Ambulatory Visit: Payer: Medicaid Other

## 2018-12-04 ENCOUNTER — Telehealth: Payer: Self-pay | Admitting: Internal Medicine

## 2018-12-04 NOTE — Telephone Encounter (Signed)
Called [employer] because they didn't give or fax [place] anything giving [place] the effective dates of pt's P/A. Effective Dates: 11/03/2018 - 10/29/2019. I put the date P/A expires on pt's folder. Nothing further needed.

## 2018-12-06 ENCOUNTER — Ambulatory Visit: Payer: Medicaid Other | Admitting: Internal Medicine

## 2018-12-07 ENCOUNTER — Ambulatory Visit: Payer: Medicaid Other | Admitting: Internal Medicine

## 2018-12-12 ENCOUNTER — Other Ambulatory Visit: Payer: Self-pay | Admitting: Internal Medicine

## 2018-12-15 ENCOUNTER — Telehealth: Payer: Self-pay | Admitting: Internal Medicine

## 2018-12-15 NOTE — Telephone Encounter (Signed)
Called and spoke with pt who stated she has been out of work since June 4 due to her job having some cases of COVID and tested positive on the job. Due to this, pt did not want to go in and jeopardize her health.  Pt stated this is her second week out of work and states she does not want to go back to work until at least June 17 or until they have all taken care of with COVID.  Pt stated she went to be tested on June 9 and stated her test came back negative.  Due to pt being out of work and with her not wanting to return until at least June 17, pt is needing a note for work. Dr. [name], please advise if you are okay with [place] writing a note for pt. Thanks!

## 2018-12-15 NOTE — Telephone Encounter (Signed)
Pt returning call.  [phone]

## 2018-12-15 NOTE — Telephone Encounter (Signed)
Ok thanks 

## 2018-12-15 NOTE — Telephone Encounter (Signed)
ATC pt, no answer. Left message for pt to call back.  

## 2018-12-15 NOTE — Telephone Encounter (Signed)
Called and spoke with pt letting her know that we were going to write the letter for her and she could come by office today, 6/12 to pick it up . Pt verbalized understanding. Letter has been placed in file cabinet top drawer for pt.  Nothing further needed.

## 2018-12-28 ENCOUNTER — Telehealth: Payer: Self-pay | Admitting: Internal Medicine

## 2018-12-28 NOTE — Telephone Encounter (Addendum)
Spoke with pt, her next inj is 01/02/2019 at 10:00. Ordered Fasenra  12/28/2018.  [name] Order: 30mg  #1 prefilled syringe Ordered date: 76/25/2020 correction: 12/28/2018  Expected date of arrival: 01/02/2019 Ordered by: [place]

## 2019-01-01 ENCOUNTER — Other Ambulatory Visit: Payer: Self-pay | Admitting: Physician Assistant

## 2019-01-01 DIAGNOSIS — Z1231 Encounter for screening mammogram for malignant neoplasm of breast: Secondary | ICD-10-CM

## 2019-01-02 ENCOUNTER — Ambulatory Visit: Payer: Medicaid Other

## 2019-01-02 NOTE — Telephone Encounter (Addendum)
[name] Shipment Received:  30mg  #1 prefilled syringe Medication arrival date: 01/02/2019 Lot #: [id number] Exp date: 01/2020 Received by: TBS I called pt in time before her appt to make her aware her [name] had not come in yet. I don't know when pt's med arrived; usually they let me know when a shipment comes in. I skyped up front at 12:34 to ask if I had any boxes. "Yes", I got the cart and went and got them. My pts were steadily coming in early, after 12:00 was the first chance I had to go and check. Did COVID screening again since it has been a few days since I did the last one. Negative. Since it was so late when I went on to lunch and put meds in when I came back from a meeting with [place].

## 2019-01-02 NOTE — Telephone Encounter (Signed)
I offered for pt to come in this afternoon and she asked if I had anything tomorrow. Rescheduled pt's appt for the only opening I had 01/03/2019 at 4:30. I told pt she could come in at 4:15 or 4:20ish. Nothing further needed.

## 2019-01-03 ENCOUNTER — Telehealth: Payer: Self-pay

## 2019-01-03 ENCOUNTER — Other Ambulatory Visit: Payer: Self-pay

## 2019-01-03 ENCOUNTER — Ambulatory Visit: Payer: Medicaid Other

## 2019-01-03 NOTE — Telephone Encounter (Signed)
Patient decided to reschedule.  We are clear Judson Roch.  Nothing further needed.

## 2019-01-03 NOTE — Telephone Encounter (Signed)
Patient arrived for [facility] injection. Denies any symptoms today.  No known exposure or concern for covid exposure, Temp checked 3 times:  99.15F, 99.7 and 99.7F ov 15 minutes.  Last repeat 98.3F as sending this message.  Can patient have Injection today or should she be rescheduled?   Routed to Eric Form NP for review just due to multiple temps taken.  Sarah please advise if ok for injection

## 2019-01-03 NOTE — Telephone Encounter (Signed)
Thanks so much. 

## 2019-01-09 ENCOUNTER — Ambulatory Visit: Payer: Medicaid Other

## 2019-01-09 ENCOUNTER — Ambulatory Visit: Payer: Medicaid Other | Admitting: Internal Medicine

## 2019-01-11 ENCOUNTER — Other Ambulatory Visit: Payer: Self-pay

## 2019-01-11 ENCOUNTER — Ambulatory Visit (INDEPENDENT_AMBULATORY_CARE_PROVIDER_SITE_OTHER): Payer: Medicaid Other

## 2019-01-11 DIAGNOSIS — J4551 Severe persistent asthma with (acute) exacerbation: Secondary | ICD-10-CM

## 2019-01-11 MED ORDER — BENRALIZUMAB 30 MG/ML ~~LOC~~ SOSY
30.0000 mg | PREFILLED_SYRINGE | Freq: Once | SUBCUTANEOUS | Status: AC
  Administered 2019-01-11: 10:00:00 30 mg via SUBCUTANEOUS

## 2019-01-11 NOTE — Progress Notes (Signed)
Have you been hospitalized within the last 10 days?  No Do you have a fever?  No Do you have a cough?  No Do you have a headache or sore throat? No  

## 2019-02-07 ENCOUNTER — Telehealth: Payer: Self-pay | Admitting: Internal Medicine

## 2019-02-07 MED ORDER — FASENRA 30 MG/ML ~~LOC~~ SOSY: 30.0000 mg | PREFILLED_SYRINGE | SUBCUTANEOUS | 6 refills | Status: DC

## 2019-02-07 NOTE — Telephone Encounter (Signed)
Received faxed refills request from [facility] requesting refill on patient's Fasenra.  Last office visit 9.12.19 w/ CDY Last Fasenra injection 7.9.2020  Refills sent electronically

## 2019-02-14 ENCOUNTER — Ambulatory Visit: Payer: Medicaid Other

## 2019-02-15 ENCOUNTER — Ambulatory Visit: Payer: Medicaid Other

## 2019-03-01 ENCOUNTER — Ambulatory Visit: Payer: Medicaid Other | Admitting: Internal Medicine

## 2019-03-05 ENCOUNTER — Telehealth: Payer: Self-pay | Admitting: Internal Medicine

## 2019-03-05 NOTE — Telephone Encounter (Signed)
[name] Order: 30mg  #1 prefilled syringe Ordered date: 03/05/19 Expected date of arrival: 03/08/19 Ordered by: [facility]: [facility]

## 2019-03-06 ENCOUNTER — Ambulatory Visit
Admission: RE | Admit: 2019-03-06 | Discharge: 2019-03-06 | Disposition: A | Discharge status: Home / Self Care | Payer: Medicaid Other | Source: Ambulatory Visit | Attending: Physician Assistant | Admitting: Physician Assistant

## 2019-03-06 ENCOUNTER — Other Ambulatory Visit: Payer: Self-pay

## 2019-03-06 DIAGNOSIS — Z1231 Encounter for screening mammogram for malignant neoplasm of breast: Secondary | ICD-10-CM

## 2019-03-08 NOTE — Telephone Encounter (Signed)
Fasenra Shipment Received:  30mg  #1 prefilled syringe Medication arrival date: 03/08/2019 Lot #: [id number] Exp date: 04/2020 Received by: Desmond Dike, [place]

## 2019-03-09 ENCOUNTER — Other Ambulatory Visit: Payer: Self-pay | Admitting: Internal Medicine

## 2019-03-13 ENCOUNTER — Other Ambulatory Visit: Payer: Self-pay | Admitting: Internal Medicine

## 2019-03-14 ENCOUNTER — Telehealth: Payer: Self-pay | Admitting: Internal Medicine

## 2019-03-14 ENCOUNTER — Other Ambulatory Visit: Payer: Self-pay

## 2019-03-14 ENCOUNTER — Ambulatory Visit (INDEPENDENT_AMBULATORY_CARE_PROVIDER_SITE_OTHER): Payer: Medicaid Other

## 2019-03-14 ENCOUNTER — Other Ambulatory Visit: Payer: Self-pay | Admitting: Internal Medicine

## 2019-03-14 DIAGNOSIS — J4551 Severe persistent asthma with (acute) exacerbation: Secondary | ICD-10-CM | POA: Diagnosis not present

## 2019-03-14 DIAGNOSIS — Z23 Encounter for immunization: Secondary | ICD-10-CM

## 2019-03-14 MED ORDER — ZOLPIDEM TARTRATE 10 MG PO TABS: 10.0000 mg | ORAL_TABLET | Freq: Every day | ORAL | 5 refills | Status: DC

## 2019-03-14 MED ORDER — BENRALIZUMAB 30 MG/ML ~~LOC~~ SOSY
30.0000 mg | PREFILLED_SYRINGE | Freq: Once | SUBCUTANEOUS | Status: AC
  Administered 2019-03-14: 30 mg via SUBCUTANEOUS

## 2019-03-14 MED ORDER — MONTELUKAST SODIUM 10 MG PO TABS: ORAL_TABLET | ORAL | 12 refills | Status: DC

## 2019-03-14 MED ORDER — ALBUTEROL SULFATE HFA 108 (90 BASE) MCG/ACT IN AERS: INHALATION_SPRAY | RESPIRATORY_TRACT | 12 refills | Status: DC

## 2019-03-14 MED ORDER — ANORO ELLIPTA 62.5-25 MCG/INH IN AEPB: INHALATION_SPRAY | RESPIRATORY_TRACT | 12 refills | Status: DC

## 2019-03-14 MED ORDER — IPRATROPIUM-ALBUTEROL 0.5-2.5 (3) MG/3ML IN SOLN: RESPIRATORY_TRACT | 12 refills | Status: DC

## 2019-03-14 NOTE — Telephone Encounter (Signed)
Her script refills e-sent. We need a DOB for her son Blanche East

## 2019-03-14 NOTE — Telephone Encounter (Signed)
ATC patient, unable to reach, LM to call office back 

## 2019-03-14 NOTE — Telephone Encounter (Signed)
Called spoke with patient she needs all her medication refilled. I asked which medications she states inhalers and sleeping pill. 1. Albuterol 2. Nebulizer 3. anoro 4. singulair 5. Ambien.  Patient also states joshua lewis needs medication refilled allergy pills and no spray, but no date of Birth given.  Dr. [name] please advise on medication refills  Allergies  Allergen Reactions  . Celebrex [Celecoxib] Swelling and Rash  . Pregabalin Other (See Comments)    *LYRICA* REACTION: itching, swelling *LYRICA* REACTION: itching, swelling  . Sulfa Antibiotics Other (See Comments)   Current Outpatient Medications on File Prior to Visit  Medication Sig Dispense Refill  . albuterol (PROAIR HFA) 108 (90 Base) MCG/ACT inhaler TAKE 2 PUFFS EVERY 4 HOURS AS NEEDED -RESCUE 8.5 Inhaler 11  . ALPHAGAN P 0.15 % ophthalmic solution Place 1 drop into both eyes 2 (two) times daily.  0  . ALPRAZolam (XANAX) 0.5 MG tablet Take 0.5 mg by mouth 3 (three) times daily as needed (anxiety). Anxiety.    Jearl Klinefelter ELLIPTA 62.5-25 MCG/INH AEPB INHALE 1 PUFF INTO THE LUNGS DAILY. 60 each 12  . Benralizumab (FASENRA) 30 MG/ML SOSY Inject 30 mg into the skin every 8 (eight) weeks. 1 mL 6  . cyclobenzaprine (FLEXERIL) 5 MG tablet Take 1 tablet (5 mg total) by mouth 3 (three) times daily as needed for muscle spasms. 30 tablet 0  . EPINEPHrine 0.3 mg/0.3 mL IJ SOAJ injection Inject into the thigh once for severe allergic reaction. 1 Device 5  . fluconazole (DIFLUCAN) 150 MG tablet TAKE 1 TABLET BY MOUTH AS ONE DOSE 1 tablet 2  . ipratropium-albuterol (DUONEB) 0.5-2.5 (3) MG/3ML SOLN USE 1 VIAL VIA NEBULIZER EVERY 4 HRS AS NEEDED 360 mL 2  . isosorbide mononitrate (IMDUR) 30 MG 24 hr tablet Take 1 tablet (30 mg total) by mouth daily. 90 tablet 3  . latanoprost (XALATAN) 0.005 % ophthalmic solution Place 1 drop into both eyes at bedtime.  0  . LATUDA 40 MG TABS tablet Take 20 mg by mouth every evening.  1  . losartan (COZAAR)  100 MG tablet Take 1 tablet (100 mg total) by mouth daily. 90 tablet 3  . methocarbamol (ROBAXIN) 500 MG tablet Take 1 tablet (500 mg total) by mouth 2 (two) times daily. May cause drowsiness 20 tablet 0  . metoprolol tartrate (LOPRESSOR) 25 MG tablet Take 1 tablet (25 mg total) by mouth 2 (two) times daily. 180 tablet 3  . metroNIDAZOLE (METROGEL) 0.75 % vaginal gel PLACE 1 APPLICATORFUL VAGINALLY 2 (TWO) TIMES DAILY. 70 g 2  . montelukast (SINGULAIR) 10 MG tablet TAKE 1 TABLET BY MOUTH EVERY DAY IN THE MORNING 90 tablet 0  . omeprazole (PRILOSEC) 40 MG capsule Take 40 mg by mouth 2 (two) times daily.      . ondansetron (ZOFRAN) 4 MG tablet Take 4 mg by mouth every 8 (eight) hours as needed for nausea or vomiting.   5  . predniSONE (DELTASONE) 10 MG tablet TAKE 4TABSX2DAYS, 3TABSX2DAYS, 2TABSX2DAYS, 1TABX2DAYS, THEN STOP 20 tablet 0  . spironolactone (ALDACTONE) 25 MG tablet Take 1 tablet (25 mg total) by mouth daily. 90 tablet 3  . torsemide (DEMADEX) 20 MG tablet Take 1 tablet (20 mg total) by mouth 2 (two) times daily. 180 tablet 3  . zolpidem (AMBIEN) 10 MG tablet Take 1 tablet (10 mg total) by mouth at bedtime. 30 tablet 5   No current facility-administered medications on file prior to visit.

## 2019-03-14 NOTE — Progress Notes (Signed)
All questions were answered by the patient before medication was administered. Have you been hospitalized in the last 10 days? No Do you have a fever? No Do you have a cough? No Do you have a headache or sore throat? No  

## 2019-03-15 NOTE — Telephone Encounter (Signed)
LMTCB x2 for pt 

## 2019-03-16 NOTE — Telephone Encounter (Signed)
ATC pt, line went to voicemail. LMTCB x3. Multiple attempts have been made to reach this patient. Per triage, will close this message out. Nothing further needed at this time.

## 2019-04-06 ENCOUNTER — Encounter (HOSPITAL_COMMUNITY): Payer: Self-pay | Admitting: Emergency Medicine

## 2019-04-06 ENCOUNTER — Emergency Department (HOSPITAL_COMMUNITY)
Admission: EM | Admit: 2019-04-06 | Discharge: 2019-04-06 | Disposition: A | Discharge status: Home / Self Care | Payer: Medicaid Other | Attending: Emergency Medicine | Admitting: Emergency Medicine

## 2019-04-06 ENCOUNTER — Ambulatory Visit (INDEPENDENT_AMBULATORY_CARE_PROVIDER_SITE_OTHER): Payer: Medicaid Other | Admitting: Pulmonary Disease

## 2019-04-06 ENCOUNTER — Emergency Department (HOSPITAL_COMMUNITY): Payer: Medicaid Other

## 2019-04-06 ENCOUNTER — Other Ambulatory Visit: Payer: Self-pay

## 2019-04-06 ENCOUNTER — Encounter: Payer: Self-pay | Admitting: Pulmonary Disease

## 2019-04-06 DIAGNOSIS — Z79899 Other long term (current) drug therapy: Secondary | ICD-10-CM | POA: Insufficient documentation

## 2019-04-06 DIAGNOSIS — J4551 Severe persistent asthma with (acute) exacerbation: Secondary | ICD-10-CM

## 2019-04-06 DIAGNOSIS — Z20828 Contact with and (suspected) exposure to other viral communicable diseases: Secondary | ICD-10-CM | POA: Insufficient documentation

## 2019-04-06 DIAGNOSIS — R0602 Shortness of breath: Secondary | ICD-10-CM

## 2019-04-06 DIAGNOSIS — I10 Essential (primary) hypertension: Secondary | ICD-10-CM | POA: Insufficient documentation

## 2019-04-06 DIAGNOSIS — I5042 Chronic combined systolic (congestive) and diastolic (congestive) heart failure: Secondary | ICD-10-CM | POA: Diagnosis not present

## 2019-04-06 DIAGNOSIS — R05 Cough: Secondary | ICD-10-CM

## 2019-04-06 DIAGNOSIS — J189 Pneumonia, unspecified organism: Secondary | ICD-10-CM | POA: Insufficient documentation

## 2019-04-06 DIAGNOSIS — J90 Pleural effusion, not elsewhere classified: Secondary | ICD-10-CM | POA: Insufficient documentation

## 2019-04-06 DIAGNOSIS — Z87891 Personal history of nicotine dependence: Secondary | ICD-10-CM | POA: Diagnosis not present

## 2019-04-06 DIAGNOSIS — R059 Cough, unspecified: Secondary | ICD-10-CM

## 2019-04-06 DIAGNOSIS — D649 Anemia, unspecified: Secondary | ICD-10-CM | POA: Insufficient documentation

## 2019-04-06 LAB — CBC
HCT: 29.3 % — ABNORMAL LOW (ref 36.0–46.0)
Hemoglobin: 8.6 g/dL — ABNORMAL LOW (ref 12.0–15.0)
MCH: 24 pg — ABNORMAL LOW (ref 26.0–34.0)
MCHC: 29.4 g/dL — ABNORMAL LOW (ref 30.0–36.0)
MCV: 81.6 fL (ref 80.0–100.0)
Platelets: 452 10*3/uL — ABNORMAL HIGH (ref 150–400)
RBC: 3.59 MIL/uL — ABNORMAL LOW (ref 3.87–5.11)
RDW: 18.2 % — ABNORMAL HIGH (ref 11.5–15.5)
WBC: 21.4 10*3/uL — ABNORMAL HIGH (ref 4.0–10.5)
nRBC: 0.1 % (ref 0.0–0.2)

## 2019-04-06 LAB — LACTIC ACID, PLASMA
Lactic Acid, Venous: 0.7 mmol/L (ref 0.5–1.9)
Lactic Acid, Venous: 1.1 mmol/L (ref 0.5–1.9)

## 2019-04-06 LAB — COMPREHENSIVE METABOLIC PANEL
ALT: 49 U/L — ABNORMAL HIGH (ref 0–44)
AST: 40 U/L (ref 15–41)
Albumin: 3.1 g/dL — ABNORMAL LOW (ref 3.5–5.0)
Alkaline Phosphatase: 205 U/L — ABNORMAL HIGH (ref 38–126)
Anion gap: 10 (ref 5–15)
BUN: 8 mg/dL (ref 6–20)
CO2: 22 mmol/L (ref 22–32)
Calcium: 9.3 mg/dL (ref 8.9–10.3)
Chloride: 105 mmol/L (ref 98–111)
Creatinine, Ser: 0.64 mg/dL (ref 0.44–1.00)
GFR calc Af Amer: >60 mL/min (ref >60)
GFR calc non Af Amer: >60 mL/min (ref >60)
Glucose, Bld: 161 mg/dL — ABNORMAL HIGH (ref 70–99)
Potassium: 3.4 mmol/L — ABNORMAL LOW (ref 3.5–5.1)
Sodium: 137 mmol/L (ref 135–145)
Total Bilirubin: 0.2 mg/dL — ABNORMAL LOW (ref 0.3–1.2)
Total Protein: 9.1 g/dL — ABNORMAL HIGH (ref 6.5–8.1)

## 2019-04-06 LAB — BRAIN NATRIURETIC PEPTIDE: B Natriuretic Peptide: 65.3 pg/mL (ref 0.0–100.0)

## 2019-04-06 LAB — D-DIMER, QUANTITATIVE: D-Dimer, Quant: 4.11 ug/mL-FEU — ABNORMAL HIGH (ref 0.00–0.50)

## 2019-04-06 LAB — SARS CORONAVIRUS 2 BY RT PCR (HOSPITAL ORDER, PERFORMED IN ~~LOC~~ HOSPITAL LAB): SARS Coronavirus 2: NEGATIVE

## 2019-04-06 LAB — TROPONIN I (HIGH SENSITIVITY)
Troponin I (High Sensitivity): 8 ng/L (ref <18)
Troponin I (High Sensitivity): 7 ng/L (ref <18)

## 2019-04-06 MED ORDER — ALBUTEROL SULFATE HFA 108 (90 BASE) MCG/ACT IN AERS
6.0000 | INHALATION_SPRAY | Freq: Once | RESPIRATORY_TRACT | Status: AC
  Administered 2019-04-06: 6 via RESPIRATORY_TRACT
  Filled 2019-04-06: qty 6.7

## 2019-04-06 MED ORDER — SODIUM CHLORIDE 0.9 % IV SOLN
1.0000 g | Freq: Once | INTRAVENOUS | Status: AC
  Administered 2019-04-06: 1 g via INTRAVENOUS
  Filled 2019-04-06: qty 10

## 2019-04-06 MED ORDER — IOHEXOL 350 MG/ML SOLN
100.0000 mL | Freq: Once | INTRAVENOUS | Status: AC | PRN
  Administered 2019-04-06: 100 mL via INTRAVENOUS

## 2019-04-06 MED ORDER — SODIUM CHLORIDE (PF) 0.9 % IJ SOLN
INTRAMUSCULAR | Status: AC
  Administered 2019-04-06: 15:00:00
  Filled 2019-04-06: qty 50

## 2019-04-06 MED ORDER — SODIUM CHLORIDE 0.9% FLUSH: 3.0000 mL | Freq: Once | INTRAVENOUS | Status: DC

## 2019-04-06 MED ORDER — IPRATROPIUM BROMIDE HFA 17 MCG/ACT IN AERS
2.0000 | INHALATION_SPRAY | Freq: Once | RESPIRATORY_TRACT | Status: AC
  Administered 2019-04-06: 12:00:00 2 via RESPIRATORY_TRACT
  Filled 2019-04-06: qty 12.9

## 2019-04-06 MED ORDER — AMOXICILLIN-POT CLAVULANATE 875-125 MG PO TABS: 1.0000 | ORAL_TABLET | Freq: Two times a day (BID) | ORAL | 0 refills | Status: DC

## 2019-04-06 MED ORDER — SODIUM CHLORIDE 0.9 % IV SOLN
500.0000 mg | Freq: Once | INTRAVENOUS | Status: AC
  Administered 2019-04-06: 16:00:00 500 mg via INTRAVENOUS
  Filled 2019-04-06: qty 500

## 2019-04-06 MED ORDER — DOXYCYCLINE HYCLATE 100 MG PO CAPS: 100.0000 mg | ORAL_CAPSULE | Freq: Two times a day (BID) | ORAL | 0 refills | Status: DC

## 2019-04-06 MED ORDER — PREDNISONE 20 MG PO TABS
60.0000 mg | ORAL_TABLET | Freq: Once | ORAL | Status: AC
  Administered 2019-04-06: 60 mg via ORAL
  Filled 2019-04-06: qty 3

## 2019-04-06 NOTE — ED Notes (Signed)
Pt ambulated to the restroom without assistance

## 2019-04-06 NOTE — Patient Instructions (Addendum)
Please proceed to [facility] long emergency room based off the acute concerns that you are sharing with me today telephonically  Increase shortness of breath for 1 months Increased dyspnea on exertion for the last 3 weeks Chest wall pain on your right side for 3 weeks Coughing up discolored red mucus potentially hemoptysis for 1 week Feelings of sensation of increased heart rate  We will get you scheduled for a follow-up with Dr. [name] for regular follow-up.  Return if symptoms worsen or fail to improve, for Follow up with Dr. [name] - first available open slot .   Wyn Quaker FNP

## 2019-04-06 NOTE — ED Triage Notes (Signed)
Per pt, states she has been having SOB and chest pain for about a month-states she is coughing up brown/green mucus-dyspnea upon exertion-PCP told her to come to ED
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
## 2019-04-06 NOTE — ED Notes (Signed)
Patient is refusing a rectal temp. Patient is defensive and stated "I am not doing all this! I came here for Asthma. I do not have COVID. I do not need a rectal temp." RN attempted to explain to patient that she has an elevated WBC and therefore there is more likely something else going on that is not Asthma. RN also tried to explain that the rectal temperature is the most accurate temperature and the PA wanted to have a more accurate temp. Patient still adamantly refusing.

## 2019-04-06 NOTE — Assessment & Plan Note (Signed)
Could be an exacerbation of her asthma today.  I believe she needs an in person evaluation which is why I have routed her to the emergency room especially given the fact that she is having hemoptysis and chest wall pain.  Plan: She should continue her Anoro Ellipta Continue her [name] We will get her rescheduled to see Dr. [name] as she has not been seen by Dr. [name] since September/2019 which was an acute visit

## 2019-04-06 NOTE — Discharge Instructions (Addendum)
You were given a prescription for antibiotics. Please take the antibiotic prescription fully.   Your hemoglobin was found to be very low today. You will need to follow up with your regular doctor for further assessment of this.   You will need to make an appointment to follow up with your pulmonologist in the next 3-5 days for reassessment.   You should return to the emergency room for any new or worsening symptoms in the meantime including persistent fevers, increased shortness of breath, chest pain.

## 2019-04-06 NOTE — Assessment & Plan Note (Signed)
Increased shortness of breath for the last month.  Patient reports that her weight today is 186 pounds which is down from her last weight that we have in the chart.  This is good news.  She reports adherence to her diuretics.  Plan: Continue diuretics Continue follow-up with primary care

## 2019-04-06 NOTE — ED Provider Notes (Signed)
Pt is a 58 y/o female presenting to the ED today for eval of sob, cough. Cough is productive with intermittently brown sputum. States she has had sxs for about 1 month but sxs worsened 1 week ago. She is also c/o diffuse chest discomfort that occurs when she gets sob. Pain is pleuritic in nature.  She denies fevers, congestion, rhinorrhea, sore throat. H/o tobacco use, but quit in 1991.   She denies any COVID contacts.   She has a leukocytosis but her lactic acid is negative making sepsis less likely. She is also afebrile here.   CXR showed pneumonia  ddimer elevated -- CTA negative for PE but does show pneumonia and right pleural effusion.   Discussed with patient that she may benefit from admission especially given her sats have been borderline while in the ED but she refuses admission prefers to be discharged home with oral abx. Have advised that she will need close f/u with pulm and that if her sxs worsen she will need to return to the ED immediately.    Rodney Booze, PA-C 04/06/19 1633    Charlesetta Shanks, MD 04/09/19 [phone]

## 2019-04-06 NOTE — Assessment & Plan Note (Signed)
Wells score today 6 No history of previous DVT or PE Lower extremity swelling No full vital signs today as this is a telephonic visit with patient reports that she feels that her heart is racing Patient also reporting right-sided chest wall pain Patient reporting productive cough with red sputum for 1 week Patient admits that she is likely been more immobile lately due to her shortness of breath  Question: Unfortunately as I discussed with the patient today is very difficult to assess her telephonically given the fact that we do not have vital signs and due to the acute nature of her symptoms.  Out of her best interest I believe that it would be appropriate for her to present to the emergency room for further evaluation.  She likely needs to have at least lab work performed to rule out a potential DVT or PE.  She does have a high score today.  She also needs full vital signs.  Plan: I discussed my concerns with the patient she agrees to proceed forward with ER evaluation at [facility] We will contact [facility] to notify them that the patient should be coming to them today

## 2019-04-06 NOTE — Progress Notes (Signed)
Virtual Visit via Telephone Note  I connected with Kelly Butler on 04/06/19 at  9:30 AM EDT by telephone and verified that I am speaking with the correct person using two identifiers.  Location: Patient: Home Provider: [facility] - [id number], [name], [name], [name] [address]   I discussed the limitations, risks, security and privacy concerns of performing an evaluation and management service by telephone and the availability of in person appointments. I also discussed with the patient that there may be a patient responsible charge related to this service. The patient expressed understanding and agreed to proceed.  Patient consented to consult via telephone: Yes People present and their role in pt care: Pt     History of Present Illness:  58 year old female former smoker followed in our office for asthma, obstructive sleep apnea and obesity hypoventilation syndrome PMH: Hypertension, cardiomegaly, fibromyalgia, dysphagia, insomnia, GERD Smoking History: Former Smoker  Maintenance: Kelly Butler Patient of Dr. [name]  Chief complaint: Shortness of breath   58 year old female completing televisit with our office for increased shortness of breath for 1 month. Pt is also reporting increased dyspnea on exertion over the last week and sharp pain on right chest wall.  Patient was last seen in our office in September/2019 for an acute exacerbation of her asthma.  She has had difficulty with management of her shortness of breath as well as recurrent exacerbations in the past.  Unfortunately she was scheduled to follow back up with [place] in 3 months after September/2019 but this appointment was canceled.  We have not seen the patient since.  She also has not had regular follow-up with primary care.  She reports that she is adherent to taking her medications.  She continues to take her for center injections in our office.  She reports adherence to taking her fluid pill.   She does not weigh herself regularly but over the phone she weighed herself and her weight is down 286 pounds per the patient today.  Patient reporting she is at increased shortness of breath for the last month, increased dyspnea on exertion over the last 1 to 3 weeks.  She is had increased right-sided chest wall sharp pain for the last week.  She is also reporting that she is having a productive cough with red mucus sometimes bright red sometimes brown.  Patient is not on any sort of blood thinners.  Patient has no history of DVT or PE.  04/06/2019 - Wells Criteria Modified Wells criteria: Clinical assessment for PE  Clinical symptoms of DVT (leg swelling, pain with palpation) - 3 Other diagnoses less likely than pulmonary embolism - 3 Heart rate greater than 100 - 1.5 - sensation  Immobilization (disease greater in 3 days) or surgery in the previous 4 weeks - 1.5 Previous DVT/PE - 1.5 Hemoptysis - 1 Malignancy - 1  Probability Traditional clinical probability assessment (Wells criteria) High - greater than 6 Moderate - 2 to 6 Low less than 2  Simplify clinical probability assessment (modified Wells criteria) PE likely-greater than 4 PE unlikely little less than or equal to 4  Observations/Objective:  04/06/2019 - weight - 186  12/14/2017-IgE-2497  12/14/2017-CBC with differential- eosinophils relative 3, eosinophils absolute 0.4  12/14/2017- Feno-16  03/28/2017-chest x-ray- stable diffuse interstitial densities are noted throughout both lungs but may simply represent scarring, but superimposed acute edema or inflammation cannot be excluded 07/24/2018-echocardiogram-LV ejection fraction 50 to [id number], grade 1 diastolic dysfunction  [id number]  function test- FVC 0.87 (35% predicted), postbronchodilator ratio 97, postbronchodilator FEV1 0.84 (42% predicted), DLCO 13.86 (68% predicted)  Assessment and Plan:  DYSPNEA Wells score today 6 No history of previous DVT or PE Lower  extremity swelling No full vital signs today as this is a telephonic visit with patient reports that she feels that her heart is racing Patient also reporting right-sided chest wall pain Patient reporting productive cough with red sputum for 1 week Patient admits that she is likely been more immobile lately due to her shortness of breath  Question: Unfortunately as I discussed with the patient today is very difficult to assess her telephonically given the fact that we do not have vital signs and due to the acute nature of her symptoms.  Out of her best interest I believe that it would be appropriate for her to present to the emergency room for further evaluation.  She likely needs to have at least lab work performed to rule out a potential DVT or PE.  She does have a high score today.  She also needs full vital signs.  Plan: I discussed my concerns with the patient she agrees to proceed forward with ER evaluation at [facility] We will contact [facility] to notify them that the patient should be coming to them today    Asthma, severe persistent, poorly-controlled, with acute exacerbation Could be an exacerbation of her asthma today.  I believe she needs an in person evaluation which is why I have routed her to the emergency room especially given the fact that she is having hemoptysis and chest wall pain.  Plan: She should continue her Anoro Ellipta Continue her [name] We will get her rescheduled to see Dr. [name] as she has not been seen by Dr. [name] since September/2019 which was an acute visit  Chronic combined systolic and diastolic CHF (congestive heart failure) (HCC) Increased shortness of breath for the last month.  Patient reports that her weight today is 186 pounds which is down from her last weight that we have in the chart.  This is good news.  She reports adherence to her diuretics.  Plan: Continue diuretics Continue follow-up with primary care  Follow Up  Instructions:  Return if symptoms worsen or fail to improve, for Follow up with Dr. [name] - first available open slot .   I discussed the assessment and treatment plan with the patient. The patient was provided an opportunity to ask questions and all were answered. The patient agreed with the plan and demonstrated an understanding of the instructions.   The patient was advised to call back or seek an in-person evaluation if the symptoms worsen or if the condition fails to improve as anticipated.  I provided 28 minutes of non-face-to-face time during this encounter.   Lauraine Rinne, NP

## 2019-04-09 ENCOUNTER — Telehealth: Payer: Self-pay | Admitting: Internal Medicine

## 2019-04-09 NOTE — Telephone Encounter (Signed)
Spoke with the pt  She states that the SOB that she has been having is nothing new  This has been going on for at least a month, and she feels that it's related to her asthma  She has had no chills, fever, body aches, sore throat, loss of taste or smell, GI symptoms  She tested neg for covid 19 on 04/06/19 and has stayed at home since and isolated  I advised ok to keep appt for 04/10/19  Nothing further needed

## 2019-04-10 ENCOUNTER — Ambulatory Visit (INDEPENDENT_AMBULATORY_CARE_PROVIDER_SITE_OTHER): Payer: Medicaid Other | Admitting: Internal Medicine

## 2019-04-10 ENCOUNTER — Inpatient Hospital Stay (HOSPITAL_COMMUNITY): Payer: Medicaid Other

## 2019-04-10 ENCOUNTER — Encounter (HOSPITAL_COMMUNITY): Payer: Self-pay | Admitting: Emergency Medicine

## 2019-04-10 ENCOUNTER — Inpatient Hospital Stay (HOSPITAL_COMMUNITY)
Admission: EM | Admit: 2019-04-10 | Discharge: 2019-04-14 | DRG: 193 | Disposition: A | Discharge status: Home Health | Payer: Medicaid Other | Attending: Internal Medicine | Admitting: Internal Medicine

## 2019-04-10 ENCOUNTER — Encounter: Payer: Self-pay | Admitting: Internal Medicine

## 2019-04-10 ENCOUNTER — Emergency Department (HOSPITAL_COMMUNITY): Payer: Medicaid Other

## 2019-04-10 ENCOUNTER — Other Ambulatory Visit: Payer: Self-pay

## 2019-04-10 DIAGNOSIS — J939 Pneumothorax, unspecified: Secondary | ICD-10-CM

## 2019-04-10 DIAGNOSIS — J4540 Moderate persistent asthma, uncomplicated: Secondary | ICD-10-CM | POA: Diagnosis present

## 2019-04-10 DIAGNOSIS — K219 Gastro-esophageal reflux disease without esophagitis: Secondary | ICD-10-CM | POA: Diagnosis present

## 2019-04-10 DIAGNOSIS — Z8249 Family history of ischemic heart disease and other diseases of the circulatory system: Secondary | ICD-10-CM

## 2019-04-10 DIAGNOSIS — J90 Pleural effusion, not elsewhere classified: Secondary | ICD-10-CM | POA: Diagnosis present

## 2019-04-10 DIAGNOSIS — M797 Fibromyalgia: Secondary | ICD-10-CM | POA: Diagnosis present

## 2019-04-10 DIAGNOSIS — J9601 Acute respiratory failure with hypoxia: Secondary | ICD-10-CM | POA: Diagnosis present

## 2019-04-10 DIAGNOSIS — D638 Anemia in other chronic diseases classified elsewhere: Secondary | ICD-10-CM

## 2019-04-10 DIAGNOSIS — Z91048 Other nonmedicinal substance allergy status: Secondary | ICD-10-CM | POA: Diagnosis not present

## 2019-04-10 DIAGNOSIS — Z818 Family history of other mental and behavioral disorders: Secondary | ICD-10-CM

## 2019-04-10 DIAGNOSIS — Z882 Allergy status to sulfonamides status: Secondary | ICD-10-CM | POA: Diagnosis not present

## 2019-04-10 DIAGNOSIS — G4733 Obstructive sleep apnea (adult) (pediatric): Secondary | ICD-10-CM

## 2019-04-10 DIAGNOSIS — E662 Morbid (severe) obesity with alveolar hypoventilation: Secondary | ICD-10-CM | POA: Diagnosis present

## 2019-04-10 DIAGNOSIS — Z888 Allergy status to other drugs, medicaments and biological substances status: Secondary | ICD-10-CM

## 2019-04-10 DIAGNOSIS — Z87891 Personal history of nicotine dependence: Secondary | ICD-10-CM

## 2019-04-10 DIAGNOSIS — I5032 Chronic diastolic (congestive) heart failure: Secondary | ICD-10-CM

## 2019-04-10 DIAGNOSIS — F419 Anxiety disorder, unspecified: Secondary | ICD-10-CM | POA: Diagnosis present

## 2019-04-10 DIAGNOSIS — F329 Major depressive disorder, single episode, unspecified: Secondary | ICD-10-CM | POA: Diagnosis present

## 2019-04-10 DIAGNOSIS — Z6836 Body mass index (BMI) 36.0-36.9, adult: Secondary | ICD-10-CM | POA: Diagnosis not present

## 2019-04-10 DIAGNOSIS — Z9689 Presence of other specified functional implants: Secondary | ICD-10-CM

## 2019-04-10 DIAGNOSIS — Z825 Family history of asthma and other chronic lower respiratory diseases: Secondary | ICD-10-CM

## 2019-04-10 DIAGNOSIS — I110 Hypertensive heart disease with heart failure: Secondary | ICD-10-CM | POA: Diagnosis present

## 2019-04-10 DIAGNOSIS — Z9861 Coronary angioplasty status: Secondary | ICD-10-CM | POA: Diagnosis not present

## 2019-04-10 DIAGNOSIS — Z20828 Contact with and (suspected) exposure to other viral communicable diseases: Secondary | ICD-10-CM | POA: Diagnosis present

## 2019-04-10 DIAGNOSIS — J181 Lobar pneumonia, unspecified organism: Principal | ICD-10-CM | POA: Diagnosis present

## 2019-04-10 DIAGNOSIS — E876 Hypokalemia: Secondary | ICD-10-CM | POA: Diagnosis not present

## 2019-04-10 DIAGNOSIS — J189 Pneumonia, unspecified organism: Secondary | ICD-10-CM

## 2019-04-10 DIAGNOSIS — Z79899 Other long term (current) drug therapy: Secondary | ICD-10-CM | POA: Diagnosis not present

## 2019-04-10 DIAGNOSIS — J9600 Acute respiratory failure, unspecified whether with hypoxia or hypercapnia: Secondary | ICD-10-CM | POA: Insufficient documentation

## 2019-04-10 LAB — BRAIN NATRIURETIC PEPTIDE: B Natriuretic Peptide: 30.6 pg/mL (ref 0.0–100.0)

## 2019-04-10 LAB — CBC WITH DIFFERENTIAL/PLATELET
Abs Immature Granulocytes: 0.17 10*3/uL — ABNORMAL HIGH (ref 0.00–0.07)
Basophils Absolute: 0 10*3/uL (ref 0.0–0.1)
Basophils Relative: 0 %
Eosinophils Absolute: 0 10*3/uL (ref 0.0–0.5)
Eosinophils Relative: 0 %
HCT: 30.1 % — ABNORMAL LOW (ref 36.0–46.0)
Hemoglobin: 8.6 g/dL — ABNORMAL LOW (ref 12.0–15.0)
Immature Granulocytes: 1 %
Lymphocytes Relative: 18 %
Lymphs Abs: 3.7 10*3/uL (ref 0.7–4.0)
MCH: 23.3 pg — ABNORMAL LOW (ref 26.0–34.0)
MCHC: 28.6 g/dL — ABNORMAL LOW (ref 30.0–36.0)
MCV: 81.6 fL (ref 80.0–100.0)
Monocytes Absolute: 0.8 10*3/uL (ref 0.1–1.0)
Monocytes Relative: 4 %
Neutro Abs: 15.6 10*3/uL — ABNORMAL HIGH (ref 1.7–7.7)
Neutrophils Relative %: 77 %
Platelets: 539 10*3/uL — ABNORMAL HIGH (ref 150–400)
RBC: 3.69 MIL/uL — ABNORMAL LOW (ref 3.87–5.11)
RDW: 17.6 % — ABNORMAL HIGH (ref 11.5–15.5)
WBC: 20.4 10*3/uL — ABNORMAL HIGH (ref 4.0–10.5)
nRBC: 0.1 % (ref 0.0–0.2)

## 2019-04-10 LAB — LACTATE DEHYDROGENASE, PLEURAL OR PERITONEAL FLUID: LD, Fluid: 237 U/L — ABNORMAL HIGH (ref 3–23)

## 2019-04-10 LAB — BASIC METABOLIC PANEL
Anion gap: 11 (ref 5–15)
BUN: 11 mg/dL (ref 6–20)
CO2: 22 mmol/L (ref 22–32)
Calcium: 9.4 mg/dL (ref 8.9–10.3)
Chloride: 101 mmol/L (ref 98–111)
Creatinine, Ser: 0.68 mg/dL (ref 0.44–1.00)
GFR calc Af Amer: >60 mL/min (ref >60)
GFR calc non Af Amer: >60 mL/min (ref >60)
Glucose, Bld: 171 mg/dL — ABNORMAL HIGH (ref 70–99)
Potassium: 3.6 mmol/L (ref 3.5–5.1)
Sodium: 134 mmol/L — ABNORMAL LOW (ref 135–145)

## 2019-04-10 LAB — BODY FLUID CELL COUNT WITH DIFFERENTIAL
Eos, Fluid: 0 %
Lymphs, Fluid: 59 %
Monocyte-Macrophage-Serous Fluid: 2 % — ABNORMAL LOW (ref 50–90)
Neutrophil Count, Fluid: 39 % — ABNORMAL HIGH (ref 0–25)
Total Nucleated Cell Count, Fluid: 594 cu mm (ref 0–1000)

## 2019-04-10 LAB — PROCALCITONIN: Procalcitonin: <0.1 ng/mL

## 2019-04-10 LAB — MRSA PCR SCREENING: MRSA by PCR: NEGATIVE

## 2019-04-10 LAB — STREP PNEUMONIAE URINARY ANTIGEN: Strep Pneumo Urinary Antigen: NEGATIVE

## 2019-04-10 LAB — LACTIC ACID, PLASMA
Lactic Acid, Venous: 1.1 mmol/L (ref 0.5–1.9)
Lactic Acid, Venous: 0.6 mmol/L (ref 0.5–1.9)

## 2019-04-10 LAB — PROTEIN, PLEURAL OR PERITONEAL FLUID: Total protein, fluid: 6.4 g/dL

## 2019-04-10 LAB — GLUCOSE, PLEURAL OR PERITONEAL FLUID: Glucose, Fluid: 138 mg/dL

## 2019-04-10 LAB — SARS CORONAVIRUS 2 BY RT PCR (HOSPITAL ORDER, PERFORMED IN ~~LOC~~ HOSPITAL LAB): SARS Coronavirus 2: NEGATIVE

## 2019-04-10 LAB — TROPONIN I (HIGH SENSITIVITY): Troponin I (High Sensitivity): 4 ng/L (ref <18)

## 2019-04-10 MED ORDER — ACETAMINOPHEN 325 MG PO TABS
650.0000 mg | ORAL_TABLET | Freq: Four times a day (QID) | ORAL | Status: DC | PRN
  Administered 2019-04-10: 650 mg via ORAL
  Filled 2019-04-10: qty 2

## 2019-04-10 MED ORDER — IPRATROPIUM-ALBUTEROL 0.5-2.5 (3) MG/3ML IN SOLN
3.0000 mL | Freq: Four times a day (QID) | RESPIRATORY_TRACT | Status: DC
  Administered 2019-04-10 – 2019-04-14 (×14): 3 mL via RESPIRATORY_TRACT
  Filled 2019-04-10 (×15): qty 3

## 2019-04-10 MED ORDER — PIPERACILLIN-TAZOBACTAM 3.375 G IVPB
3.3750 g | Freq: Three times a day (TID) | INTRAVENOUS | Status: DC
  Administered 2019-04-10 – 2019-04-13 (×8): 3.375 g via INTRAVENOUS
  Filled 2019-04-10 (×7): qty 50

## 2019-04-10 MED ORDER — SPIRONOLACTONE 25 MG PO TABS
25.0000 mg | ORAL_TABLET | Freq: Every day | ORAL | Status: DC
  Administered 2019-04-10 – 2019-04-14 (×5): 25 mg via ORAL
  Filled 2019-04-10 (×5): qty 1

## 2019-04-10 MED ORDER — SODIUM CHLORIDE 0.9 % IV SOLN: 2.0000 g | INTRAVENOUS | Status: DC

## 2019-04-10 MED ORDER — ACETAMINOPHEN 650 MG RE SUPP: 650.0000 mg | Freq: Four times a day (QID) | RECTAL | Status: DC | PRN

## 2019-04-10 MED ORDER — IPRATROPIUM-ALBUTEROL 0.5-2.5 (3) MG/3ML IN SOLN: 3.0000 mL | RESPIRATORY_TRACT | Status: DC | PRN

## 2019-04-10 MED ORDER — SODIUM CHLORIDE 0.9 % IV SOLN
1.0000 g | Freq: Once | INTRAVENOUS | Status: AC
  Administered 2019-04-10: 1 g via INTRAVENOUS
  Filled 2019-04-10: qty 10

## 2019-04-10 MED ORDER — SODIUM CHLORIDE 0.9% FLUSH
3.0000 mL | Freq: Two times a day (BID) | INTRAVENOUS | Status: DC
  Administered 2019-04-12 – 2019-04-14 (×6): 3 mL via INTRAVENOUS

## 2019-04-10 MED ORDER — LATANOPROST 0.005 % OP SOLN
1.0000 [drp] | Freq: Every day | OPHTHALMIC | Status: DC
  Administered 2019-04-11 – 2019-04-13 (×3): 1 [drp] via OPHTHALMIC
  Filled 2019-04-10: qty 2.5

## 2019-04-10 MED ORDER — METOPROLOL TARTRATE 25 MG PO TABS
25.0000 mg | ORAL_TABLET | Freq: Two times a day (BID) | ORAL | Status: DC
  Administered 2019-04-10 – 2019-04-14 (×9): 25 mg via ORAL
  Filled 2019-04-10 (×9): qty 1

## 2019-04-10 MED ORDER — SODIUM CHLORIDE 0.9% FLUSH
3.0000 mL | INTRAVENOUS | Status: DC | PRN
  Administered 2019-04-10: 3 mL via INTRAVENOUS
  Filled 2019-04-10: qty 3

## 2019-04-10 MED ORDER — SODIUM CHLORIDE 0.9 % IV SOLN
500.0000 mg | Freq: Once | INTRAVENOUS | Status: AC
  Administered 2019-04-10: 500 mg via INTRAVENOUS
  Filled 2019-04-10: qty 500

## 2019-04-10 MED ORDER — HYDROCODONE-ACETAMINOPHEN 5-325 MG PO TABS
1.0000 | ORAL_TABLET | Freq: Four times a day (QID) | ORAL | Status: DC | PRN
  Administered 2019-04-10: 2 via ORAL
  Administered 2019-04-11: 1 via ORAL
  Administered 2019-04-11 – 2019-04-12 (×3): 2 via ORAL
  Administered 2019-04-13: 1 via ORAL
  Administered 2019-04-13 – 2019-04-14 (×4): 2 via ORAL
  Filled 2019-04-10 (×10): qty 2

## 2019-04-10 MED ORDER — SODIUM CHLORIDE 0.9% FLUSH
3.0000 mL | Freq: Two times a day (BID) | INTRAVENOUS | Status: DC
  Administered 2019-04-10 – 2019-04-14 (×8): 3 mL via INTRAVENOUS

## 2019-04-10 MED ORDER — CITALOPRAM HYDROBROMIDE 20 MG PO TABS
20.0000 mg | ORAL_TABLET | Freq: Every day | ORAL | Status: DC
  Administered 2019-04-10 – 2019-04-14 (×5): 20 mg via ORAL
  Filled 2019-04-10 (×5): qty 1

## 2019-04-10 MED ORDER — ALPRAZOLAM 0.5 MG PO TABS
0.5000 mg | ORAL_TABLET | Freq: Three times a day (TID) | ORAL | Status: DC | PRN
  Administered 2019-04-10 – 2019-04-14 (×2): 0.5 mg via ORAL
  Filled 2019-04-10 (×2): qty 1

## 2019-04-10 MED ORDER — LURASIDONE HCL 20 MG PO TABS
20.0000 mg | ORAL_TABLET | Freq: Every evening | ORAL | Status: DC
  Administered 2019-04-10 – 2019-04-13 (×4): 20 mg via ORAL
  Filled 2019-04-10 (×5): qty 1

## 2019-04-10 MED ORDER — MIDAZOLAM HCL (PF) 5 MG/ML IJ SOLN: 1.0000 mg | Freq: Once | INTRAMUSCULAR | Status: DC

## 2019-04-10 MED ORDER — CHLORHEXIDINE GLUCONATE CLOTH 2 % EX PADS
6.0000 | MEDICATED_PAD | Freq: Every day | CUTANEOUS | Status: DC
  Administered 2019-04-10 – 2019-04-12 (×3): 6 via TOPICAL

## 2019-04-10 MED ORDER — MONTELUKAST SODIUM 10 MG PO TABS
10.0000 mg | ORAL_TABLET | Freq: Every day | ORAL | Status: DC
  Administered 2019-04-10 – 2019-04-13 (×4): 10 mg via ORAL
  Filled 2019-04-10 (×4): qty 1

## 2019-04-10 MED ORDER — VANCOMYCIN HCL 10 G IV SOLR
1750.0000 mg | Freq: Once | INTRAVENOUS | Status: AC
  Administered 2019-04-10: 1750 mg via INTRAVENOUS
  Filled 2019-04-10: qty 1750

## 2019-04-10 MED ORDER — SODIUM CHLORIDE 0.9 % IV SOLN: 250.0000 mL | INTRAVENOUS | Status: DC | PRN

## 2019-04-10 MED ORDER — BRIMONIDINE TARTRATE 0.15 % OP SOLN
1.0000 [drp] | Freq: Two times a day (BID) | OPHTHALMIC | Status: DC
  Administered 2019-04-11 – 2019-04-14 (×5): 1 [drp] via OPHTHALMIC
  Filled 2019-04-10: qty 5

## 2019-04-10 MED ORDER — SODIUM CHLORIDE 0.9 % IV SOLN: 500.0000 mg | INTRAVENOUS | Status: DC

## 2019-04-10 MED ORDER — MIRTAZAPINE 15 MG PO TABS
30.0000 mg | ORAL_TABLET | Freq: Every day | ORAL | Status: DC
  Administered 2019-04-10 – 2019-04-13 (×4): 30 mg via ORAL
  Filled 2019-04-10 (×4): qty 2

## 2019-04-10 MED ORDER — ALBUTEROL SULFATE HFA 108 (90 BASE) MCG/ACT IN AERS
2.0000 | INHALATION_SPRAY | RESPIRATORY_TRACT | Status: DC | PRN
  Filled 2019-04-10: qty 6.7

## 2019-04-10 MED ORDER — SODIUM CHLORIDE 0.9 % IV SOLN
500.0000 mg | INTRAVENOUS | Status: DC
  Administered 2019-04-11 – 2019-04-12 (×2): 500 mg via INTRAVENOUS
  Filled 2019-04-10 (×3): qty 500

## 2019-04-10 MED ORDER — MIDAZOLAM HCL 2 MG/2ML IJ SOLN
1.0000 mg | Freq: Once | INTRAMUSCULAR | Status: AC
  Administered 2019-04-10: 1 mg via INTRAVENOUS
  Filled 2019-04-10: qty 2

## 2019-04-10 MED ORDER — PANTOPRAZOLE SODIUM 40 MG PO TBEC
40.0000 mg | DELAYED_RELEASE_TABLET | Freq: Two times a day (BID) | ORAL | Status: DC
  Administered 2019-04-10 – 2019-04-14 (×9): 40 mg via ORAL
  Filled 2019-04-10 (×9): qty 1

## 2019-04-10 MED ORDER — TORSEMIDE 20 MG PO TABS
20.0000 mg | ORAL_TABLET | Freq: Two times a day (BID) | ORAL | Status: DC
  Administered 2019-04-10 – 2019-04-14 (×8): 20 mg via ORAL
  Filled 2019-04-10 (×10): qty 1

## 2019-04-10 MED ORDER — VANCOMYCIN HCL IN DEXTROSE 750-5 MG/150ML-% IV SOLN
750.0000 mg | Freq: Two times a day (BID) | INTRAVENOUS | Status: DC
  Administered 2019-04-11 – 2019-04-12 (×3): 750 mg via INTRAVENOUS
  Filled 2019-04-10 (×3): qty 150

## 2019-04-10 MED ORDER — UMECLIDINIUM-VILANTEROL 62.5-25 MCG/INH IN AEPB
1.0000 | INHALATION_SPRAY | Freq: Every day | RESPIRATORY_TRACT | Status: DC
  Administered 2019-04-11 – 2019-04-14 (×4): 1 via RESPIRATORY_TRACT
  Filled 2019-04-10: qty 14

## 2019-04-10 MED ORDER — HYDROXYZINE HCL 25 MG PO TABS: 25.0000 mg | ORAL_TABLET | Freq: Three times a day (TID) | ORAL | Status: DC | PRN

## 2019-04-10 NOTE — ED Notes (Signed)
Consent form has been signed by patient and Dr- is placed with pt chart

## 2019-04-10 NOTE — Progress Notes (Addendum)
Pharmacy Antibiotic Note  Kelly Butler is a 58 y.o. female admitted on 04/10/2019 with pneumonia.  Pharmacy has been consulted for vancomycin dosing. Pt sent from Pulmonary MD office - PNA not improving on oral abx of Augmentin & doxy.   Plan: Zosyn 3.375 gm IV q8 per MD Azithromycin 500 mg IV q24 per MD Vancomycin 1750 mg IV loading dose Vancomycin 750 mg IV Q 12 hrs. Goal AUC 400-550. Expected AUC: 525 SCr used: 0.8 used AdjBW for CrCl Used 0.5 for Vd F/u renal fxn, WBC, temp, culture data Vancomycin levels as needed F/u MRSA PCR Daily SCr while on both vanc & zosyn     Temp (24hrs), Avg:98.4 F (36.9 C), Min:97.6 F (36.4 C), Max:99.3 F (37.4 C)  Recent Labs  Lab 04/06/19 1113 04/06/19 1440 04/06/19 1557 04/10/19 1035 04/10/19 1047  WBC 21.4*  --   --  20.4*  --   CREATININE 0.64  --   --  0.68  --   LATICACIDVEN  --  0.7 1.1  --  1.1    Estimated Creatinine Clearance: 77 mL/min (by C-G formula based on SCr of 0.68 mg/dL).    Allergies  Allergen Reactions  . Celebrex [Celecoxib] Swelling and Rash  . Pregabalin Other (See Comments)    *LYRICA* REACTION: itching, swelling *LYRICA* REACTION: itching, swelling  . Sulfa Antibiotics Other (See Comments)    Antimicrobials this admission: 10/6 ceftriaxone x 1 dose 10/6 Zosyn >> 10/6 azith> 10/6 vanc>> Dose adjustments this admission:  Microbiology results: 10/6 HIV: ordered 10/6 MRSA PCR: ordered 10/6 strep pneumo Uag: ordered 10/6 legionella: ordered 10/6 sputum: ordered 10/6 BCx2: ordered 10/6 BCx: In progress  Thank you for allowing pharmacy to be a part of this patient's care.  Eudelia Bunch, Pharm.D [phone] 04/10/2019 2:18 PM
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
## 2019-04-10 NOTE — ED Triage Notes (Signed)
Per pt, states she has been feeling bad since last Friday-states she has been taking meds which are not working-patient is a poor historian-not Radio broadcast assistant much information

## 2019-04-10 NOTE — ED Notes (Signed)
Per pulmonology, states she has been on oral antibiotics for PNA-states she is not getting better-sending to ED for further eval

## 2019-04-10 NOTE — Assessment & Plan Note (Signed)
Acute hypoxic resp failure c/w persistent pneumonia since 10/2, on chronic asthma and OHS. She drove here ok and is confident she can drive safely to [facility] ER. Anticipate will need admission , broader antibiotics and watch for fluid overload, sepsis.
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
## 2019-04-10 NOTE — Patient Instructions (Signed)
To [facility] ER for acute respiratory failure, CAP not responding clinically to doxy. Anticipate admit, O2, broader abx.
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
## 2019-04-10 NOTE — Progress Notes (Signed)
Received report from ER RN

## 2019-04-10 NOTE — Procedures (Signed)
Chest Tube Insertion Procedure Note  Indications:  Clinically significant Effusion  Pre-operative Diagnosis: Effusion  Post-operative Diagnosis: Effusion  Procedure Details  Informed consent was obtained for the procedure, including sedation.  Risks of lung perforation, hemorrhage, arrhythmia, and adverse drug reaction were discussed.   After sterile skin prep, using standard technique, a 14 French tube was placed in the right lateral  rib space which was evaluated by [place] .  Findings: 260 ml of serous fluid obtained  Estimated Blood Loss:  Minimal         Specimens:  None              Complications:  None; patient tolerated the procedure well.         Disposition: to medsurg          Condition: stable  Erick Colace ACNP-BC [place] Pager # [phone] OR # [phone] if no answer

## 2019-04-11 ENCOUNTER — Inpatient Hospital Stay (HOSPITAL_COMMUNITY): Payer: Medicaid Other

## 2019-04-11 DIAGNOSIS — F419 Anxiety disorder, unspecified: Secondary | ICD-10-CM

## 2019-04-11 DIAGNOSIS — D638 Anemia in other chronic diseases classified elsewhere: Secondary | ICD-10-CM

## 2019-04-11 DIAGNOSIS — F329 Major depressive disorder, single episode, unspecified: Secondary | ICD-10-CM

## 2019-04-11 LAB — EXPECTORATED SPUTUM ASSESSMENT W GRAM STAIN, RFLX TO RESP C: Special Requests: NORMAL

## 2019-04-11 LAB — CULTURE, BLOOD (ROUTINE X 2)
Culture: NO GROWTH
Culture: NO GROWTH
Special Requests: ADEQUATE
Special Requests: ADEQUATE

## 2019-04-11 LAB — BASIC METABOLIC PANEL
Anion gap: 11 (ref 5–15)
BUN: 9 mg/dL (ref 6–20)
CO2: 25 mmol/L (ref 22–32)
Calcium: 9.6 mg/dL (ref 8.9–10.3)
Chloride: 102 mmol/L (ref 98–111)
Creatinine, Ser: 0.78 mg/dL (ref 0.44–1.00)
GFR calc Af Amer: >60 mL/min (ref >60)
GFR calc non Af Amer: >60 mL/min (ref >60)
Glucose, Bld: 135 mg/dL — ABNORMAL HIGH (ref 70–99)
Potassium: 3.7 mmol/L (ref 3.5–5.1)
Sodium: 138 mmol/L (ref 135–145)

## 2019-04-11 LAB — PH, BODY FLUID: pH, Body Fluid: 7.4

## 2019-04-11 LAB — CBC
HCT: 31.3 % — ABNORMAL LOW (ref 36.0–46.0)
Hemoglobin: 8.9 g/dL — ABNORMAL LOW (ref 12.0–15.0)
MCH: 23.7 pg — ABNORMAL LOW (ref 26.0–34.0)
MCHC: 28.4 g/dL — ABNORMAL LOW (ref 30.0–36.0)
MCV: 83.5 fL (ref 80.0–100.0)
Platelets: 455 10*3/uL — ABNORMAL HIGH (ref 150–400)
RBC: 3.75 MIL/uL — ABNORMAL LOW (ref 3.87–5.11)
RDW: 17.5 % — ABNORMAL HIGH (ref 11.5–15.5)
WBC: 17.1 10*3/uL — ABNORMAL HIGH (ref 4.0–10.5)
nRBC: 0 % (ref 0.0–0.2)

## 2019-04-11 LAB — MAGNESIUM: Magnesium: 1.7 mg/dL (ref 1.7–2.4)

## 2019-04-11 LAB — HIV ANTIBODY (ROUTINE TESTING W REFLEX): HIV Screen 4th Generation wRfx: NONREACTIVE

## 2019-04-11 LAB — CYTOLOGY - NON PAP

## 2019-04-11 MED ORDER — POTASSIUM CHLORIDE CRYS ER 20 MEQ PO TBCR
40.0000 meq | EXTENDED_RELEASE_TABLET | Freq: Once | ORAL | Status: AC
  Administered 2019-04-11: 40 meq via ORAL
  Filled 2019-04-11: qty 2

## 2019-04-11 MED ORDER — MAGNESIUM SULFATE 2 GM/50ML IV SOLN
2.0000 g | Freq: Once | INTRAVENOUS | Status: AC
  Administered 2019-04-11: 2 g via INTRAVENOUS
  Filled 2019-04-11: qty 50

## 2019-04-11 NOTE — Progress Notes (Signed)
CT reviewed, not much left in pleural space. Continue chest tube to suction to try to get small residual PTX then can likely remove tomorrow. Consider bronch if not getting better and pleural fluids and culture data unrevealing.
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
## 2019-04-11 NOTE — Progress Notes (Signed)
PROGRESS NOTE  Kelly Butler [id number] DOB: 05/05/1961   PCP: [name], PA-C  Patient is from: Home  DOA: 04/10/2019 LOS: 1  Brief Narrative / Interim history: 58 year old female with history of asthma, OSA/OHS not on CPAP, diastolic CHF sent to ED from pulmonology office for progressive dyspnea, cough and acute respiratory failure with hypoxia.   Patient presented to ED on 10/2 with 1 months history of progressive dyspnea, chest pain and productive cough.  Treated with Augmentin and doxycycline for CAP.  Presented to pulmonology office 10/6 for follow-up and desaturated to 80% on room air requiring 4 L.  Sent to ED for further evaluation.  In ED, chest x-ray showed worsening pneumonia with effusion.  RR in 39s.  Afebrile.  Normotensive.  Mild tachycardia.  Saturation 97% on room air.  Lactic acid and procalcitonin negative.  WBC 20.  Hgb 8.6.  Admitted for lobar pneumonia, moderate pulmonary effusion and acute respiratory failure with hypoxia.  Pulmonology consulted and chest tube placed with removal of exudative fluid by light's criteria.   Subjective: No major events overnight of this morning.  Reports improvement in her breathing.  Reports pain over her chest tube area.  Cough has improved.  Denies nausea, vomiting or abdominal pain.  She had about 275 cc serosanguineous fluid overnight.  Objective: Vitals:   04/11/19 0500 04/11/19 0600 04/11/19 0700 04/11/19 0835  BP: 135/71 123/69 112/64   Pulse: 88 89 85   Resp: (!) 28 (!) 27 (!) 22   Temp:    98.7 F (37.1 C)  TempSrc:    Oral  SpO2: 95% 97% 95% 99%  Weight:      Height:        Intake/Output Summary (Last 24 hours) at 04/11/2019 1015 Last data filed at 04/11/2019 0700 Gross per 24 hour  Intake 707.89 ml  Output 975 ml  Net -267.11 ml   Filed Weights   04/10/19 1500 04/10/19 1850  Weight: 83.6 kg 83.9 kg    Examination:  GENERAL: No acute distress.  Appears well.  HEENT: MMM.  Vision and hearing grossly  intact.  NECK: Supple.  No apparent JVD.  RESP:  No IWOB.  Fair air movement bilaterally.  Rales over right lower lung.  Chest tube in place with serosanguineous fluid CVS:  RRR. Heart sounds normal.  ABD/GI/GU: Bowel sounds present. Soft. Non tender.  MSK/EXT:  Moves extremities. No apparent deformity or edema.  SKIN: no apparent skin lesion or wound NEURO: Awake, alert and oriented appropriately.  No gross deficit.  PSYCH: Calm. Normal affect.   Assessment & Plan: Acute respiratory failure with hypoxia due to right lobar pneumonia and parapneumonic effusion.  COVID-19 negative. -CTA chest on 10/2 negative for PE but dense R sided mass-like consolidation and mediastinal adenopathy -Declined admission and treated outpatient with oral antibiotics but she got worse. -Status post chest tube placement by pulmonology 10/6.  Fluid exudative by light criteria. -On vancomycin, Zosyn and azithromycin 10/6>>>.  MRSA PCR negative.  May D/C vancomycin -Daily chest x-ray -Appreciate pulmonology care and guidance. -Follow cultures.  Leukocytosis/bandemia: Likely due to the above. -Continue trending  Asthma, OSA, OHS not on CPAP -Continue bronchodilators and Singulair  Chronic diastolic CHF: Previously with systolic CHF.  Appears euvolemic. -Continue home torsemide and spironolactone -Closely monitor fluid status.  Anemia of chronic disease: H&H stable. -Continue monitoring  GERD -Continue PPI  Thrombocytosis: Likely reactive.  Anxiety/depression: Stable. -Continue home medications.    DVT prophylaxis: SCD Code Status: Full code  Family Communication: Patient and/or RN. Available if any question. Disposition Plan: Remains inpatient. Consultants: Pulmonology  Procedures:  10/6-chest tube placement  Microbiology summarized: 10/6-COVID-19 negative. 10/6-blood culture pending 10/6-sputum culture pending 10/6-pleural fluid culture negative so far 10/6-MRSA PCR negative  Sch Meds:   Scheduled Meds:  brimonidine  1 drop Both Eyes BID   Chlorhexidine Gluconate Cloth  6 each Topical Daily   citalopram  20 mg Oral Daily   ipratropium-albuterol  3 mL Nebulization QID   latanoprost  1 drop Both Eyes QHS   lurasidone  20 mg Oral QPM   metoprolol tartrate  25 mg Oral BID   mirtazapine  30 mg Oral QHS   montelukast  10 mg Oral QHS   pantoprazole  40 mg Oral BID   sodium chloride flush  3 mL Intravenous Q12H   sodium chloride flush  3 mL Intravenous Q12H   spironolactone  25 mg Oral Daily   torsemide  20 mg Oral BID   umeclidinium-vilanterol  1 puff Inhalation Daily   Continuous Infusions:  sodium chloride     azithromycin     piperacillin-tazobactam 12.5 mL/hr at 04/11/19 0700   vancomycin Stopped (04/11/19 0602)   PRN Meds:.sodium chloride, acetaminophen **OR** acetaminophen, ALPRAZolam, HYDROcodone-acetaminophen, hydrOXYzine, ipratropium-albuterol, sodium chloride flush  Antimicrobials: Anti-infectives (From admission, onward)   Start     Dose/Rate Route Frequency Ordered Stop   04/11/19 1200  azithromycin (ZITHROMAX) 500 mg in sodium chloride 0.9 % 250 mL IVPB     500 mg 250 mL/hr over 60 Minutes Intravenous Every 24 hours 04/10/19 1418     04/11/19 0400  vancomycin (VANCOCIN) IVPB 750 mg/150 ml premix     750 mg 150 mL/hr over 60 Minutes Intravenous Every 12 hours 04/10/19 1643     04/10/19 1430  cefTRIAXone (ROCEPHIN) 2 g in sodium chloride 0.9 % 100 mL IVPB  Status:  Discontinued     2 g 200 mL/hr over 30 Minutes Intravenous Every 24 hours 04/10/19 1425 04/10/19 1604   04/10/19 1430  azithromycin (ZITHROMAX) 500 mg in sodium chloride 0.9 % 250 mL IVPB  Status:  Discontinued     500 mg 250 mL/hr over 60 Minutes Intravenous Every 24 hours 04/10/19 1425 04/10/19 1908   04/10/19 1430  vancomycin (VANCOCIN) 1,750 mg in sodium chloride 0.9 % 500 mL IVPB     1,750 mg 250 mL/hr over 120 Minutes Intravenous  Once 04/10/19 1357 04/10/19 1723    04/10/19 1430  piperacillin-tazobactam (ZOSYN) IVPB 3.375 g     3.375 g 12.5 mL/hr over 240 Minutes Intravenous Every 8 hours 04/10/19 1413     04/10/19 1115  cefTRIAXone (ROCEPHIN) 1 g in sodium chloride 0.9 % 100 mL IVPB     1 g 200 mL/hr over 30 Minutes Intravenous  Once 04/10/19 1108 04/10/19 1205   04/10/19 1115  azithromycin (ZITHROMAX) 500 mg in sodium chloride 0.9 % 250 mL IVPB     500 mg 250 mL/hr over 60 Minutes Intravenous  Once 04/10/19 1108 04/10/19 1320       I have personally reviewed the following labs and images: CBC: Recent Labs  Lab 04/06/19 1113 04/10/19 1035 04/11/19 0518  WBC 21.4* 20.4* 17.1*  NEUTROABS  --  15.6*  --   HGB 8.6* 8.6* 8.9*  HCT 29.3* 30.1* 31.3*  MCV 81.6 81.6 83.5  PLT 452* 539* 455*   BMP &GFR Recent Labs  Lab 04/06/19 1113 04/10/19 1035 04/11/19 0518  NA 137 134* 138  K  3.4* 3.6 3.7  CL 105 101 102  CO2 22 22 25   GLUCOSE 161* 171* 135*  BUN 8 11 9   CREATININE 0.64 0.68 0.78  CALCIUM 9.3 9.4 9.6  MG  --   --  1.7   Estimated Creatinine Clearance: 75.5 mL/min (by C-G formula based on SCr of 0.78 mg/dL). Liver & Pancreas: Recent Labs  Lab 04/06/19 1113  AST 40  ALT 49*  ALKPHOS 205*  BILITOT 0.2*  PROT 9.1*  ALBUMIN 3.1*   No results for input(s): LIPASE, AMYLASE in the last 168 hours. No results for input(s): AMMONIA in the last 168 hours. Diabetic: No results for input(s): HGBA1C in the last 72 hours. No results for input(s): GLUCAP in the last 168 hours. Cardiac Enzymes: No results for input(s): CKTOTAL, CKMB, CKMBINDEX, TROPONINI in the last 168 hours. No results for input(s): PROBNP in the last 8760 hours. Coagulation Profile: No results for input(s): INR, PROTIME in the last 168 hours. Thyroid Function Tests: No results for input(s): TSH, T4TOTAL, FREET4, T3FREE, THYROIDAB in the last 72 hours. Lipid Profile: No results for input(s): CHOL, HDL, LDLCALC, TRIG, CHOLHDL, LDLDIRECT in the last 72  hours. Anemia Panel: No results for input(s): VITAMINB12, FOLATE, FERRITIN, TIBC, IRON, RETICCTPCT in the last 72 hours. Urine analysis:    Component Value Date/Time   COLORURINE YELLOW 04/14/2016 1030   APPEARANCEUR CLEAR 04/14/2016 1030   LABSPEC 1.015 04/14/2016 1030   PHURINE 7.5 04/14/2016 1030   GLUCOSEU NEGATIVE 04/14/2016 1030   HGBUR NEGATIVE 04/14/2016 [place] 04/14/2016 1030   [place] 04/14/2016 1030   PROTEINUR NEGATIVE 04/14/2016 1030   UROBILINOGEN 1.0 08/23/2012 1432   NITRITE NEGATIVE 04/14/2016 1030   LEUKOCYTESUR NEGATIVE 04/14/2016 1030   Sepsis Labs: Invalid input(s): PROCALCITONIN, [place]  Microbiology: Recent Results (from the past 240 hour(s))  SARS Coronavirus 2 [facility] order, Performed in [facility] hospital lab) Nasopharyngeal Nasopharyngeal Swab     Status: None   Collection Time: 04/06/19 11:49 AM   Specimen: Nasopharyngeal Swab  Result Value Ref Range Status   SARS Coronavirus 2 NEGATIVE NEGATIVE Final    Comment: (NOTE) If result is NEGATIVE SARS-CoV-2 target nucleic acids are NOT DETECTED. The SARS-CoV-2 RNA is generally detectable in upper and lower  respiratory specimens during the acute phase of infection. The lowest  concentration of SARS-CoV-2 viral copies this assay can detect is 250  copies / mL. A negative result does not preclude SARS-CoV-2 infection  and should not be used as the sole basis for treatment or other  patient management decisions.  A negative result may occur with  improper specimen collection / handling, submission of specimen other  than nasopharyngeal swab, presence of viral mutation(s) within the  areas targeted by this assay, and inadequate number of viral copies  (<250 copies / mL). A negative result must be combined with clinical  observations, patient history, and epidemiological information. If result is POSITIVE SARS-CoV-2 target nucleic acids are DETECTED. The  SARS-CoV-2 RNA is generally detectable in upper and lower  respiratory specimens dur ing the acute phase of infection.  Positive  results are indicative of active infection with SARS-CoV-2.  Clinical  correlation with patient history and other diagnostic information is  necessary to determine patient infection status.  Positive results do  not rule out bacterial infection or co-infection with other viruses. If result is PRESUMPTIVE POSTIVE SARS-CoV-2 nucleic acids MAY BE PRESENT.   A presumptive positive result was obtained on the submitted specimen  and confirmed on repeat testing.  While 2019 novel coronavirus  (SARS-CoV-2) nucleic acids may be present in the submitted sample  additional confirmatory testing may be necessary for epidemiological  and / or clinical management purposes  to differentiate between  SARS-CoV-2 and other Sarbecovirus currently known to infect humans.  If clinically indicated additional testing with an alternate test  methodology [phone]) is advised. The SARS-CoV-2 RNA is generally  detectable in upper and lower respiratory sp ecimens during the acute  phase of infection. The expected result is Negative. Fact Sheet for Patients:  StrictlyIdeas.no Fact Sheet for Healthcare Providers: BankingDealers.co.za This test is not yet approved or cleared by the [place] FDA and has been authorized for detection and/or diagnosis of SARS-CoV-2 by FDA under an Emergency Use Authorization (EUA).  This EUA will remain in effect (meaning this test can be used) for the duration of the COVID-19 declaration under Section 564(b)(1) of the Act, 21 U.S.C. section 360bbb-3(b)(1), unless the authorization is terminated or revoked sooner. Performed at [facility], [place] [address] [place], [place] [address]   Blood culture (routine x 2)     Status: None (Preliminary result)   Collection Time: 04/06/19  2:40 PM    Specimen: BLOOD LEFT FOREARM  Result Value Ref Range Status   Specimen Description   Final    BLOOD LEFT FOREARM Performed at [facility], [place] [address], [place], [place] [address]    Special Requests   Final    BOTTLES DRAWN AEROBIC AND ANAEROBIC Blood Culture adequate volume Performed at [place] [address], [place], [place] [address]    Culture   Final    NO GROWTH 4 DAYS Performed at [facility], [place] [address] [place], [place] [address]    Report Status PENDING  Incomplete  Blood culture (routine x 2)     Status: None (Preliminary result)   Collection Time: 04/06/19  2:40 PM   Specimen: BLOOD RIGHT FOREARM  Result Value Ref Range Status   Specimen Description   Final    BLOOD RIGHT FOREARM Performed at [facility], [place] [address] [place], [place] [address]    Special Requests   Final    BOTTLES DRAWN AEROBIC AND ANAEROBIC Blood Culture adequate volume Performed at [place] [address], [place], [place] [address]    Culture   Final    NO GROWTH 4 DAYS Performed at [facility], [place] [address] [place], [place] [address]    Report Status PENDING  Incomplete  SARS Coronavirus 2 [facility] order, Performed in [facility] hospital lab) Nasopharyngeal Nasopharyngeal Swab     Status: None   Collection Time: 04/10/19 10:47 AM   Specimen: Nasopharyngeal Swab  Result Value Ref Range Status   SARS Coronavirus 2 NEGATIVE NEGATIVE Final    Comment: (NOTE) If result is NEGATIVE SARS-CoV-2 target nucleic acids are NOT DETECTED. The SARS-CoV-2 RNA is generally detectable in upper and lower  respiratory specimens during the acute phase of infection. The lowest  concentration of SARS-CoV-2 viral copies this assay can detect is 250  copies / mL. A negative result does not preclude SARS-CoV-2 infection  and should not be used as the sole basis for treatment or other  patient management decisions.  A  negative result may occur with  improper specimen collection / handling, submission of specimen other  than nasopharyngeal swab, presence of viral mutation(s) within the  areas  targeted by this assay, and inadequate number of viral copies  (<250 copies / mL). A negative result must be combined with clinical  observations, patient history, and epidemiological information. If result is POSITIVE SARS-CoV-2 target nucleic acids are DETECTED. The SARS-CoV-2 RNA is generally detectable in upper and lower  respiratory specimens dur ing the acute phase of infection.  Positive  results are indicative of active infection with SARS-CoV-2.  Clinical  correlation with patient history and other diagnostic information is  necessary to determine patient infection status.  Positive results do  not rule out bacterial infection or co-infection with other viruses. If result is PRESUMPTIVE POSTIVE SARS-CoV-2 nucleic acids MAY BE PRESENT.   A presumptive positive result was obtained on the submitted specimen  and confirmed on repeat testing.  While 2019 novel coronavirus  (SARS-CoV-2) nucleic acids may be present in the submitted sample  additional confirmatory testing may be necessary for epidemiological  and / or clinical management purposes  to differentiate between  SARS-CoV-2 and other Sarbecovirus currently known to infect humans.  If clinically indicated additional testing with an alternate test  methodology [phone]) is advised. The SARS-CoV-2 RNA is generally  detectable in upper and lower respiratory sp ecimens during the acute  phase of infection. The expected result is Negative. Fact Sheet for Patients:  StrictlyIdeas.no Fact Sheet for Healthcare Providers: BankingDealers.co.za This test is not yet approved or cleared by the [place] FDA and has been authorized for detection and/or diagnosis of SARS-CoV-2 by FDA under an Emergency Use  Authorization (EUA).  This EUA will remain in effect (meaning this test can be used) for the duration of the COVID-19 declaration under Section 564(b)(1) of the Act, 21 U.S.C. section 360bbb-3(b)(1), unless the authorization is terminated or revoked sooner. Performed at [place], [place] [address], [place], [place] [address]   Body fluid culture (includes gram stain)     Status: None (Preliminary result)   Collection Time: 04/10/19  2:31 PM   Specimen: Pleural Fluid  Result Value Ref Range Status   Specimen Description   Final    PLEURAL Performed at [place] [address] [place], [place] [address]    Special Requests   Final    NONE Performed at [facility], [place] [address], [place], [place] [address]    Gram Stain   Final    FEW WBC PRESENT,BOTH PMN AND MONONUCLEAR NO ORGANISMS SEEN    Culture   Final    NO GROWTH < 24 HOURS Performed at [facility], [place] [address] [place], [place] [address]    Report Status PENDING  Incomplete  MRSA PCR Screening     Status: None   Collection Time: 04/10/19  6:48 PM   Specimen: Nasal Mucosa; Nasopharyngeal  Result Value Ref Range Status   MRSA by PCR NEGATIVE NEGATIVE Final    Comment:        The GeneXpert MRSA Assay (FDA approved for NASAL specimens only), is one component of a comprehensive MRSA colonization surveillance program. It is not intended to diagnose MRSA infection nor to guide or monitor treatment for MRSA infections. Performed at [facility], [place] [address], [place], [place] [address]     Radiology Studies: Dg Chest 1 View  Result Date: 04/11/2019 CLINICAL DATA:  Chest tube. EXAM: CHEST  1 VIEW COMPARISON:  Radiograph of April 10, 2019. FINDINGS: Stable cardiomegaly. Increased bilateral perihilar opacities are noted concerning for worsening edema or  infiltrates. Stable position of right-sided basilar chest tube with stable  right basilar opacity, concerning for atelectasis and/or effusion. Bony thorax is unremarkable. IMPRESSION: Stable position of right basilar chest tube with stable right basilar atelectasis and/or effusion. Increased bilateral perihilar opacities are noted concerning for worsening edema or inflammation. Electronically Signed   By: Marijo Conception M.D.   On: 04/11/2019 07:56   Dg Chest 1 View  Result Date: 04/10/2019 CLINICAL DATA:  Right-sided chest tube placement. EXAM: CHEST  1 VIEW COMPARISON:  04/10/2019 at 10:57 a.m. FINDINGS: Since the earlier study, a pigtail chest tube has been placed in the inferior right hemithorax. There has been a significant decrease in right lower lung zone opacity consistent with decreased pleural fluid. Most of the right hemidiaphragm is now visible. There is patchy opacity in the right mid to lower lung zone consistent with atelectasis or infection. There is milder opacity in the right upper lobe extending from the right hilum, also consistent with atelectasis or infection. Possible minimal lateral loculated pneumothorax on the right. No atypical pneumothorax. Linear opacities in the left mid lung consistent with atelectasis. Left lung otherwise clear. No left pleural effusion. IMPRESSION: 1. Significant reduction in right pleural fluid following placement of the right-sided, inferior me thorax, pigtail chest tube. 2. Suspect a minimal, loculated lateral right pneumothorax. No apical pneumothorax. 3. Residual opacity in the right mid to lower lung, and to a lesser degree right upper lung, may reflect atelectasis, pneumonia or a combination. Electronically Signed   By: Lajean Manes M.D.   On: 04/10/2019 15:03   Dg Chest 2 View  Result Date: 04/10/2019 CLINICAL DATA:  No pneumonia, assess progression EXAM: CHEST - 2 VIEW COMPARISON:  04/06/2019 FINDINGS: Enlargement of cardiac silhouette with vascular congestion. Progressive opacity in the RIGHT hemithorax likely representing  a combination of increased infiltrate, basilar atelectasis, and loculated pleural effusion. LEFT lung clear. No pneumothorax or acute osseous findings. IMPRESSION: Progressive opacity of the RIGHT hemithorax likely representing a combination of increased infiltrate, basilar atelectasis and increased loculated RIGHT pleural effusion. Enlargement of cardiac silhouette with pulmonary vascular congestion. Electronically Signed   By: Lavonia Dana M.D.   On: 04/10/2019 11:06    35 minutes with more than 50% spent in reviewing records, counseling patient and coordinating care.  Bonnita Newby T. [place]  If 7PM-7AM, please contact night-coverage [email or web address] Password [email or web address] 04/11/2019, 10:15 AM

## 2019-04-12 ENCOUNTER — Inpatient Hospital Stay (HOSPITAL_COMMUNITY): Payer: Medicaid Other

## 2019-04-12 DIAGNOSIS — E876 Hypokalemia: Secondary | ICD-10-CM

## 2019-04-12 LAB — CBC
HCT: 29.2 % — ABNORMAL LOW (ref 36.0–46.0)
Hemoglobin: 8.3 g/dL — ABNORMAL LOW (ref 12.0–15.0)
MCH: 23.4 pg — ABNORMAL LOW (ref 26.0–34.0)
MCHC: 28.4 g/dL — ABNORMAL LOW (ref 30.0–36.0)
MCV: 82.3 fL (ref 80.0–100.0)
Platelets: 543 10*3/uL — ABNORMAL HIGH (ref 150–400)
RBC: 3.55 MIL/uL — ABNORMAL LOW (ref 3.87–5.11)
RDW: 17.4 % — ABNORMAL HIGH (ref 11.5–15.5)
WBC: 12.6 10*3/uL — ABNORMAL HIGH (ref 4.0–10.5)
nRBC: 0 % (ref 0.0–0.2)

## 2019-04-12 LAB — BASIC METABOLIC PANEL
Anion gap: 12 (ref 5–15)
BUN: 11 mg/dL (ref 6–20)
CO2: 23 mmol/L (ref 22–32)
Calcium: 9 mg/dL (ref 8.9–10.3)
Chloride: 101 mmol/L (ref 98–111)
Creatinine, Ser: 0.77 mg/dL (ref 0.44–1.00)
GFR calc Af Amer: >60 mL/min (ref >60)
GFR calc non Af Amer: >60 mL/min (ref >60)
Glucose, Bld: 121 mg/dL — ABNORMAL HIGH (ref 70–99)
Potassium: 3.4 mmol/L — ABNORMAL LOW (ref 3.5–5.1)
Sodium: 136 mmol/L (ref 135–145)

## 2019-04-12 LAB — MAGNESIUM: Magnesium: 1.7 mg/dL (ref 1.7–2.4)

## 2019-04-12 MED ORDER — POTASSIUM CHLORIDE CRYS ER 20 MEQ PO TBCR
40.0000 meq | EXTENDED_RELEASE_TABLET | Freq: Once | ORAL | Status: AC
  Administered 2019-04-12: 40 meq via ORAL
  Filled 2019-04-12: qty 2

## 2019-04-12 NOTE — Progress Notes (Signed)
0130 - Irrigated chest tube with 30 cc sterile water. Will continue to monitor.

## 2019-04-12 NOTE — Progress Notes (Signed)
PROGRESS NOTE  Kelly Butler [id number] DOB: January 26, 1961   PCP: [name], PA-C  Patient is from: Home  DOA: 04/10/2019 LOS: 2  Brief Narrative / Interim history: 58 year old female with history of asthma, OSA/OHS not on CPAP, diastolic CHF sent to ED from pulmonology office for progressive dyspnea, cough and acute respiratory failure with hypoxia.   Patient presented to ED on 10/2 with 1 months history of progressive dyspnea, chest pain and productive cough.  Treated with Augmentin and doxycycline for CAP.  Presented to pulmonology office 10/6 for follow-up and desaturated to 80% on room air requiring 4 L.  Sent to ED for further evaluation.  In ED, chest x-ray showed worsening pneumonia with effusion.  RR in 41s.  Afebrile.  Normotensive.  Mild tachycardia.  Saturation 97% on room air.  Lactic acid and procalcitonin negative.  WBC 20.  Hgb 8.6.  Admitted for lobar pneumonia, moderate pulmonary effusion and acute respiratory failure with hypoxia.  Pulmonology consulted and chest tube placed with removal of exudative fluid by light's criteria.  Blood, fluid and sputum cultures negative so far.  No malignant cells on cytology.  Patient had CT chest 10/7 revealed masslike infiltrate or questionable mass.  Subjective: No major events overnight of this morning.  Reports pain across a chest bilaterally.  Pain is worse with cough.  Denies dyspnea.  Denies nausea, vomiting, abdominal pain or UTI symptoms.  Objective: Vitals:   04/12/19 1000 04/12/19 1100 04/12/19 1200 04/12/19 1215  BP:      Pulse: 96 95    Resp: 16 (!) 27    Temp:   98.9 F (37.2 C)   TempSrc:   Oral   SpO2: 90% 97%  97%  Weight:      Height:        Intake/Output Summary (Last 24 hours) at 04/12/2019 1253 Last data filed at 04/12/2019 1200 Gross per 24 hour  Intake 572.49 ml  Output 3435 ml  Net -2862.51 ml   Filed Weights   04/10/19 1500 04/10/19 1850  Weight: 83.6 kg 83.9 kg    Examination:  GENERAL: No  acute distress.  Appears well.  HEENT: MMM.  Vision and hearing grossly intact.  NECK: Supple.  No apparent JVD.  RESP:  No IWOB.  Fair air movement bilaterally.  Chest tube in place.  Serosanguineous fluid. CVS:  RRR. Heart sounds normal.  ABD/GI/GU: Bowel sounds present. Soft. Non tender.  MSK/EXT:  Moves extremities. No apparent deformity or edema.  SKIN: no apparent skin lesion or wound NEURO: Awake, alert and oriented appropriately.  No gross deficit.  PSYCH: Calm. Normal affect.   Assessment & Plan: Acute respiratory failure with hypoxia due to right lobar pneumonia and parapneumonic effusion.  COVID-19 negative. -CTA chest on 10/2 negative for PE but dense R sided mass-like consolidation and mediastinal adenopathy -Status post chest tube placement by pulmonology 10/6.  Fluid exudative by light criteria.  Culture negative. -CT chest on 10/7 with masslike right lung base consolidation likely infection, question mass. -On vancomycin, Zosyn and azithromycin 10/6>>>.  MRSA PCR negative.  May D/C vancomycin -Appreciate pulmonology care and guidance-plan to remove chest tube today. -Follow cultures.  Abnormal CT chest as above. -Repeat CT chest in 3 to 4 weeks outpatient after antibiotic course.  Leukocytosis/bandemia: Likely due to the above. -Continue trending  Asthma, OSA, OHS not on CPAP -Continue bronchodilators and Singulair  Chronic diastolic CHF: Previously with systolic CHF.  Appears euvolemic. -Continue home torsemide and spironolactone -Closely monitor fluid  status.  Hypokalemia: -Replenish and recheck.  Anemia of chronic disease: H&H stable. -Continue monitoring  GERD -Continue PPI  Thrombocytosis: Likely reactive.  Anxiety/depression: Stable. -Continue home medications.    DVT prophylaxis: SCD Code Status: Full code Family Communication: Patient and/or RN. Available if any question. Disposition Plan: Remains inpatient. Consultants:  Pulmonology  Procedures:  10/6-chest tube placement  Microbiology summarized: 10/6-COVID-19 negative. 10/6-blood culture negative so far 10/6-sputum culture negative so far 10/6-pleural fluid culture negative so far 10/6-MRSA PCR negative  Sch Meds:  Scheduled Meds:  brimonidine  1 drop Both Eyes BID   Chlorhexidine Gluconate Cloth  6 each Topical Daily   citalopram  20 mg Oral Daily   ipratropium-albuterol  3 mL Nebulization QID   latanoprost  1 drop Both Eyes QHS   lurasidone  20 mg Oral QPM   metoprolol tartrate  25 mg Oral BID   mirtazapine  30 mg Oral QHS   montelukast  10 mg Oral QHS   pantoprazole  40 mg Oral BID   sodium chloride flush  3 mL Intravenous Q12H   sodium chloride flush  3 mL Intravenous Q12H   spironolactone  25 mg Oral Daily   torsemide  20 mg Oral BID   umeclidinium-vilanterol  1 puff Inhalation Daily   Continuous Infusions:  sodium chloride     azithromycin 500 mg (04/12/19 1213)   piperacillin-tazobactam Stopped (04/12/19 1010)   PRN Meds:.sodium chloride, acetaminophen **OR** acetaminophen, ALPRAZolam, HYDROcodone-acetaminophen, hydrOXYzine, ipratropium-albuterol, sodium chloride flush  Antimicrobials: Anti-infectives (From admission, onward)   Start     Dose/Rate Route Frequency Ordered Stop   04/11/19 1200  azithromycin (ZITHROMAX) 500 mg in sodium chloride 0.9 % 250 mL IVPB     500 mg 250 mL/hr over 60 Minutes Intravenous Every 24 hours 04/10/19 1418     04/11/19 0400  vancomycin (VANCOCIN) IVPB 750 mg/150 ml premix  Status:  Discontinued     750 mg 150 mL/hr over 60 Minutes Intravenous Every 12 hours 04/10/19 1643 04/12/19 1014   04/10/19 1430  cefTRIAXone (ROCEPHIN) 2 g in sodium chloride 0.9 % 100 mL IVPB  Status:  Discontinued     2 g 200 mL/hr over 30 Minutes Intravenous Every 24 hours 04/10/19 1425 04/10/19 1604   04/10/19 1430  azithromycin (ZITHROMAX) 500 mg in sodium chloride 0.9 % 250 mL IVPB  Status:   Discontinued     500 mg 250 mL/hr over 60 Minutes Intravenous Every 24 hours 04/10/19 1425 04/10/19 1908   04/10/19 1430  vancomycin (VANCOCIN) 1,750 mg in sodium chloride 0.9 % 500 mL IVPB     1,750 mg 250 mL/hr over 120 Minutes Intravenous  Once 04/10/19 1357 04/10/19 1723   04/10/19 1430  piperacillin-tazobactam (ZOSYN) IVPB 3.375 g     3.375 g 12.5 mL/hr over 240 Minutes Intravenous Every 8 hours 04/10/19 1413     04/10/19 1115  cefTRIAXone (ROCEPHIN) 1 g in sodium chloride 0.9 % 100 mL IVPB     1 g 200 mL/hr over 30 Minutes Intravenous  Once 04/10/19 1108 04/10/19 1205   04/10/19 1115  azithromycin (ZITHROMAX) 500 mg in sodium chloride 0.9 % 250 mL IVPB     500 mg 250 mL/hr over 60 Minutes Intravenous  Once 04/10/19 1108 04/10/19 1320       I have personally reviewed the following labs and images: CBC: Recent Labs  Lab 04/06/19 1113 04/10/19 1035 04/11/19 0518 04/12/19 0211  WBC 21.4* 20.4* 17.1* 12.6*  NEUTROABS  --  15.6*  --   --  HGB 8.6* 8.6* 8.9* 8.3*  HCT 29.3* 30.1* 31.3* 29.2*  MCV 81.6 81.6 83.5 82.3  PLT 452* 539* 455* 543*   BMP &GFR Recent Labs  Lab 04/06/19 1113 04/10/19 1035 04/11/19 0518 04/12/19 0211  NA 137 134* 138 136  K 3.4* 3.6 3.7 3.4*  CL 105 101 102 101  CO2 22 22 25 23   GLUCOSE 161* 171* 135* 121*  BUN 8 11 9 11   CREATININE 0.64 0.68 0.78 0.77  CALCIUM 9.3 9.4 9.6 9.0  MG  --   --  1.7 1.7   Estimated Creatinine Clearance: 75.5 mL/min (by C-G formula based on SCr of 0.77 mg/dL). Liver & Pancreas: Recent Labs  Lab 04/06/19 1113  AST 40  ALT 49*  ALKPHOS 205*  BILITOT 0.2*  PROT 9.1*  ALBUMIN 3.1*   No results for input(s): LIPASE, AMYLASE in the last 168 hours. No results for input(s): AMMONIA in the last 168 hours. Diabetic: No results for input(s): HGBA1C in the last 72 hours. No results for input(s): GLUCAP in the last 168 hours. Cardiac Enzymes: No results for input(s): CKTOTAL, CKMB, CKMBINDEX, TROPONINI in the  last 168 hours. No results for input(s): PROBNP in the last 8760 hours. Coagulation Profile: No results for input(s): INR, PROTIME in the last 168 hours. Thyroid Function Tests: No results for input(s): TSH, T4TOTAL, FREET4, T3FREE, THYROIDAB in the last 72 hours. Lipid Profile: No results for input(s): CHOL, HDL, LDLCALC, TRIG, CHOLHDL, LDLDIRECT in the last 72 hours. Anemia Panel: No results for input(s): VITAMINB12, FOLATE, FERRITIN, TIBC, IRON, RETICCTPCT in the last 72 hours. Urine analysis:    Component Value Date/Time   COLORURINE YELLOW 04/14/2016 1030   APPEARANCEUR CLEAR 04/14/2016 1030   LABSPEC 1.015 04/14/2016 1030   PHURINE 7.5 04/14/2016 1030   GLUCOSEU NEGATIVE 04/14/2016 1030   HGBUR NEGATIVE 04/14/2016 [place] 04/14/2016 1030   [place] 04/14/2016 1030   PROTEINUR NEGATIVE 04/14/2016 1030   UROBILINOGEN 1.0 08/23/2012 1432   NITRITE NEGATIVE 04/14/2016 1030   LEUKOCYTESUR NEGATIVE 04/14/2016 1030   Sepsis Labs: Invalid input(s): PROCALCITONIN, [place]  Microbiology: Recent Results (from the past 240 hour(s))  SARS Coronavirus 2 [facility] order, Performed in [facility] hospital lab) Nasopharyngeal Nasopharyngeal Swab     Status: None   Collection Time: 04/06/19 11:49 AM   Specimen: Nasopharyngeal Swab  Result Value Ref Range Status   SARS Coronavirus 2 NEGATIVE NEGATIVE Final    Comment: (NOTE) If result is NEGATIVE SARS-CoV-2 target nucleic acids are NOT DETECTED. The SARS-CoV-2 RNA is generally detectable in upper and lower  respiratory specimens during the acute phase of infection. The lowest  concentration of SARS-CoV-2 viral copies this assay can detect is 250  copies / mL. A negative result does not preclude SARS-CoV-2 infection  and should not be used as the sole basis for treatment or other  patient management decisions.  A negative result may occur with  improper specimen collection / handling, submission of  specimen other  than nasopharyngeal swab, presence of viral mutation(s) within the  areas targeted by this assay, and inadequate number of viral copies  (<250 copies / mL). A negative result must be combined with clinical  observations, patient history, and epidemiological information. If result is POSITIVE SARS-CoV-2 target nucleic acids are DETECTED. The SARS-CoV-2 RNA is generally detectable in upper and lower  respiratory specimens dur ing the acute phase of infection.  Positive  results are indicative of active infection with SARS-CoV-2.  Clinical  correlation with patient history and other diagnostic information is  necessary to determine patient infection status.  Positive results do  not rule out bacterial infection or co-infection with other viruses. If result is PRESUMPTIVE POSTIVE SARS-CoV-2 nucleic acids MAY BE PRESENT.   A presumptive positive result was obtained on the submitted specimen  and confirmed on repeat testing.  While 2019 novel coronavirus  (SARS-CoV-2) nucleic acids may be present in the submitted sample  additional confirmatory testing may be necessary for epidemiological  and / or clinical management purposes  to differentiate between  SARS-CoV-2 and other Sarbecovirus currently known to infect humans.  If clinically indicated additional testing with an alternate test  methodology [phone]) is advised. The SARS-CoV-2 RNA is generally  detectable in upper and lower respiratory sp ecimens during the acute  phase of infection. The expected result is Negative. Fact Sheet for Patients:  StrictlyIdeas.no Fact Sheet for Healthcare Providers: BankingDealers.co.za This test is not yet approved or cleared by the [place] FDA and has been authorized for detection and/or diagnosis of SARS-CoV-2 by FDA under an Emergency Use Authorization (EUA).  This EUA will remain in effect (meaning this test can be used) for the  duration of the COVID-19 declaration under Section 564(b)(1) of the Act, 21 U.S.C. section 360bbb-3(b)(1), unless the authorization is terminated or revoked sooner. Performed at [facility], [place] [address] [place], [place] [address]   Blood culture (routine x 2)     Status: None   Collection Time: 04/06/19  2:40 PM   Specimen: BLOOD LEFT FOREARM  Result Value Ref Range Status   Specimen Description   Final    BLOOD LEFT FOREARM Performed at [facility], [place] [address], [place], [place] [address]    Special Requests   Final    BOTTLES DRAWN AEROBIC AND ANAEROBIC Blood Culture adequate volume Performed at [place] [address], [place], [place] [address]    Culture   Final    NO GROWTH 5 DAYS Performed at [facility], [place] [address] [place], [place] [address]    Report Status 04/11/2019 FINAL  Final  Blood culture (routine x 2)     Status: None   Collection Time: 04/06/19  2:40 PM   Specimen: BLOOD RIGHT FOREARM  Result Value Ref Range Status   Specimen Description   Final    BLOOD RIGHT FOREARM Performed at [facility], [place] [address], [place], [place] [address]    Special Requests   Final    BOTTLES DRAWN AEROBIC AND ANAEROBIC Blood Culture adequate volume Performed at [place] [address] [place], [place] [address]    Culture   Final    NO GROWTH 5 DAYS Performed at [facility], [place] [address] [place], [place] [address]    Report Status 04/11/2019 FINAL  Final  Blood culture (routine x 2)     Status: None (Preliminary result)   Collection Time: 04/10/19 10:35 AM   Specimen: BLOOD  Result Value Ref Range Status   Specimen Description   Final    BLOOD RIGHT ANTECUBITAL Performed at [place] [address], [place], [place] [address]    Special Requests   Final    BOTTLES DRAWN AEROBIC AND ANAEROBIC Blood Culture adequate volume Performed at  [place] [address] [place],  [address]    Culture   Final    NO GROWTH  2 DAYS Performed at [facility], [place] [address] [place], [place] [address]    Report Status PENDING  Incomplete  SARS Coronavirus 2 [facility] order, Performed in [facility] hospital lab) Nasopharyngeal Nasopharyngeal Swab     Status: None   Collection Time: 04/10/19 10:47 AM   Specimen: Nasopharyngeal Swab  Result Value Ref Range Status   SARS Coronavirus 2 NEGATIVE NEGATIVE Final    Comment: (NOTE) If result is NEGATIVE SARS-CoV-2 target nucleic acids are NOT DETECTED. The SARS-CoV-2 RNA is generally detectable in upper and lower  respiratory specimens during the acute phase of infection. The lowest  concentration of SARS-CoV-2 viral copies this assay can detect is 250  copies / mL. A negative result does not preclude SARS-CoV-2 infection  and should not be used as the sole basis for treatment or other  patient management decisions.  A negative result may occur with  improper specimen collection / handling, submission of specimen other  than nasopharyngeal swab, presence of viral mutation(s) within the  areas targeted by this assay, and inadequate number of viral copies  (<250 copies / mL). A negative result must be combined with clinical  observations, patient history, and epidemiological information. If result is POSITIVE SARS-CoV-2 target nucleic acids are DETECTED. The SARS-CoV-2 RNA is generally detectable in upper and lower  respiratory specimens dur ing the acute phase of infection.  Positive  results are indicative of active infection with SARS-CoV-2.  Clinical  correlation with patient history and other diagnostic information is  necessary to determine patient infection status.  Positive results do  not rule out bacterial infection or co-infection with other viruses. If result is PRESUMPTIVE POSTIVE SARS-CoV-2 nucleic acids MAY BE PRESENT.   A presumptive  positive result was obtained on the submitted specimen  and confirmed on repeat testing.  While 2019 novel coronavirus  (SARS-CoV-2) nucleic acids may be present in the submitted sample  additional confirmatory testing may be necessary for epidemiological  and / or clinical management purposes  to differentiate between  SARS-CoV-2 and other Sarbecovirus currently known to infect humans.  If clinically indicated additional testing with an alternate test  methodology [phone]) is advised. The SARS-CoV-2 RNA is generally  detectable in upper and lower respiratory sp ecimens during the acute  phase of infection. The expected result is Negative. Fact Sheet for Patients:  StrictlyIdeas.no Fact Sheet for Healthcare Providers: BankingDealers.co.za This test is not yet approved or cleared by the [place] FDA and has been authorized for detection and/or diagnosis of SARS-CoV-2 by FDA under an Emergency Use Authorization (EUA).  This EUA will remain in effect (meaning this test can be used) for the duration of the COVID-19 declaration under Section 564(b)(1) of the Act, 21 U.S.C. section 360bbb-3(b)(1), unless the authorization is terminated or revoked sooner. Performed at [facility], [place] [address] [place], [place] [address]   Body fluid culture (includes gram stain)     Status: None (Preliminary result)   Collection Time: 04/10/19  2:31 PM   Specimen: Pleural Fluid  Result Value Ref Range Status   Specimen Description   Final    PLEURAL Performed at [place] [address] [place], [place] [address]    Special Requests   Final    NONE Performed at [facility], [place] [address], [place], [place] [address]    Gram Stain   Final    FEW WBC PRESENT,BOTH PMN AND MONONUCLEAR NO ORGANISMS SEEN  Culture   Final    NO GROWTH 2 DAYS Performed at [facility], [place] [address] [place], [place] [address]    Report Status PENDING  Incomplete  MRSA PCR Screening     Status: None   Collection Time: 04/10/19  6:48 PM   Specimen: Nasal Mucosa; Nasopharyngeal  Result Value Ref Range Status   MRSA by PCR NEGATIVE NEGATIVE Final    Comment:        The GeneXpert MRSA Assay (FDA approved for NASAL specimens only), is one component of a comprehensive MRSA colonization surveillance program. It is not intended to diagnose MRSA infection nor to guide or monitor treatment for MRSA infections. Performed at [facility], [place] [address], [place], [place] [address]   Blood culture (routine x 2)     Status: None (Preliminary result)   Collection Time: 04/10/19  6:56 PM   Specimen: BLOOD  Result Value Ref Range Status   Specimen Description   Final    BLOOD LEFT ANTECUBITAL Performed at [place] [address], [place], [place] [address]    Special Requests   Final    BOTTLES DRAWN AEROBIC ONLY Blood Culture adequate volume Performed at [place] [address] [place], [place] [address]    Culture   Final    NO GROWTH 2 DAYS Performed at  [address] [place], [place] [address]    Report Status PENDING  Incomplete  Expectorated sputum assessment w rflx to resp cult     Status: None   Collection Time: 04/11/19 11:49 AM   Specimen: Sputum  Result Value Ref Range Status   Specimen Description SPUTUM  Final   Special Requests Normal  Final   Sputum evaluation   Final    THIS SPECIMEN IS ACCEPTABLE FOR SPUTUM CULTURE Performed at [facility], [place] [address] [place], [place] [address]    Report Status 04/11/2019 FINAL  Final  Culture, respiratory     Status: None (Preliminary result)   Collection Time: 04/11/19 11:49 AM   Specimen: SPU  Result Value Ref Range Status   Specimen Description   Final    SPUTUM Performed at [place] [address] [place], [place] [address]    Special Requests   Final    Normal Reflexed from [id number] Performed at [facility], [place] [address] [place], [place] [address]    Gram Stain   Final    RARE WBC PRESENT, PREDOMINANTLY MONONUCLEAR FEW SQUAMOUS EPITHELIAL CELLS PRESENT NO ORGANISMS SEEN    Culture   Final    CULTURE REINCUBATED FOR BETTER GROWTH Performed at [facility], [place] [address], [place],  [address]    Report Status PENDING  Incomplete    Radiology Studies: Ct Chest Wo Contrast  Result Date: 04/11/2019 CLINICAL DATA:  Chest pain and shortness-of-breath, suspect pleural effusion. Follow-up chest tube placement. EXAM: CT CHEST WITHOUT CONTRAST TECHNIQUE: Multidetector CT imaging of the chest was performed following the standard protocol without IV contrast. COMPARISON:  04/06/2019 FINDINGS: Cardiovascular: Mild stable cardiomegaly. Thoracic aorta is normal in caliber. Remaining vascular structures are unremarkable. Mediastinum/Nodes: Subcentimeter superior mediastinal lymph node unchanged. Findings suggesting subcarinal adenopathy measuring 1.7 cm by short axis unchanged. Subcentimeter right pericardiophrenic lymph node unchanged. Remaining mediastinal structures unremarkable on this noncontrast study. Lungs/Pleura: Lungs are adequately inflated and demonstrate evidence of patient's right posterior basilar pleural drainage catheter in  adequate position over the posterior right pleural space. There is a small persistent right-sided pneumothorax over the anterolateral mid to lower thorax. There is a persistent small right pleural effusion without significant change. Persistent masslike consolidation over the lateral right base which may represent atelectasis, although mass is possible. Patchy atelectasis over the right mid to upper lung and posterior mid to lower left lung. Airways are unremarkable. Upper Abdomen: Calcified plaque over the  abdominal aorta. Few subcentimeter lymph nodes over the gastrohepatic ligament unchanged. No acute findings. Musculoskeletal: Minimal degenerative change of the spine. IMPRESSION: 1. Persistent small right-sided pleural effusion without significant change. Right posterior pleural drainage catheter in adequate position. Small right pneumothorax. Patchy bilateral atelectasis. 2. Masslike consolidation over the lateral right base which may be due to atelectasi or infection s, although underlying mass is possible. Suggestion of subcarinal adenopathy unchanged. Recommend follow-up CT 3-4 weeks to resolution to exclude underlying malignancy. 3.  Aortic Atherosclerosis (ICD10-I70.0).  Stable cardiomegaly. Electronically Signed   By: Marin Olp M.D.   On: 04/11/2019 13:42   Dg Chest Port 1 View  Result Date: 04/12/2019 CLINICAL DATA:  Follow-up pneumothorax EXAM: PORTABLE CHEST 1 VIEW COMPARISON:  04/11/2019 FINDINGS: Cardiac shadow remains enlarged. Pigtail catheter is again noted in the right base. No pneumothorax is seen. Improved aeration is noted in both lungs although some persistent right basilar opacity remains. The left lung is predominantly clear with the exception of platelike atelectasis in the mid lung. No bony abnormality is seen. IMPRESSION: Overall improved aeration with persistent opacities as described worse in the right base. Electronically Signed   By: Inez Catalina M.D.   On: 04/12/2019 11:38    Bishoy Cupp T. [place]  If 7PM-7AM, please contact night-coverage [email or web address] Password [email or web address] 04/12/2019, 12:53 PM
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
## 2019-04-13 ENCOUNTER — Telehealth: Payer: Self-pay | Admitting: *Deleted

## 2019-04-13 ENCOUNTER — Inpatient Hospital Stay (HOSPITAL_COMMUNITY): Payer: Medicaid Other

## 2019-04-13 DIAGNOSIS — J181 Lobar pneumonia, unspecified organism: Secondary | ICD-10-CM

## 2019-04-13 LAB — CBC
HCT: 30.9 % — ABNORMAL LOW (ref 36.0–46.0)
Hemoglobin: 8.7 g/dL — ABNORMAL LOW (ref 12.0–15.0)
MCH: 23.4 pg — ABNORMAL LOW (ref 26.0–34.0)
MCHC: 28.2 g/dL — ABNORMAL LOW (ref 30.0–36.0)
MCV: 83.1 fL (ref 80.0–100.0)
Platelets: 579 10*3/uL — ABNORMAL HIGH (ref 150–400)
RBC: 3.72 MIL/uL — ABNORMAL LOW (ref 3.87–5.11)
RDW: 17.3 % — ABNORMAL HIGH (ref 11.5–15.5)
WBC: 13.4 10*3/uL — ABNORMAL HIGH (ref 4.0–10.5)
nRBC: 0 % (ref 0.0–0.2)

## 2019-04-13 LAB — BASIC METABOLIC PANEL
Anion gap: 13 (ref 5–15)
BUN: 12 mg/dL (ref 6–20)
CO2: 25 mmol/L (ref 22–32)
Calcium: 9.2 mg/dL (ref 8.9–10.3)
Chloride: 99 mmol/L (ref 98–111)
Creatinine, Ser: 0.85 mg/dL (ref 0.44–1.00)
GFR calc Af Amer: >60 mL/min (ref >60)
GFR calc non Af Amer: >60 mL/min (ref >60)
Glucose, Bld: 118 mg/dL — ABNORMAL HIGH (ref 70–99)
Potassium: 3.5 mmol/L (ref 3.5–5.1)
Sodium: 137 mmol/L (ref 135–145)

## 2019-04-13 LAB — CULTURE, RESPIRATORY W GRAM STAIN
Culture: NORMAL
Special Requests: NORMAL

## 2019-04-13 LAB — LEGIONELLA PNEUMOPHILA SEROGP 1 UR AG: L. pneumophila Serogp 1 Ur Ag: NEGATIVE

## 2019-04-13 LAB — MAGNESIUM: Magnesium: 1.4 mg/dL — ABNORMAL LOW (ref 1.7–2.4)

## 2019-04-13 MED ORDER — AMOXICILLIN-POT CLAVULANATE 875-125 MG PO TABS
1.0000 | ORAL_TABLET | Freq: Two times a day (BID) | ORAL | Status: DC
  Administered 2019-04-13 – 2019-04-14 (×3): 1 via ORAL
  Filled 2019-04-13 (×3): qty 1

## 2019-04-13 MED ORDER — AZITHROMYCIN 250 MG PO TABS
500.0000 mg | ORAL_TABLET | Freq: Every day | ORAL | Status: DC
  Administered 2019-04-13 – 2019-04-14 (×2): 500 mg via ORAL
  Filled 2019-04-13 (×2): qty 2

## 2019-04-13 MED ORDER — MAGNESIUM SULFATE 2 GM/50ML IV SOLN
2.0000 g | Freq: Once | INTRAVENOUS | Status: AC
  Administered 2019-04-13: 2 g via INTRAVENOUS
  Filled 2019-04-13: qty 50

## 2019-04-13 NOTE — Telephone Encounter (Signed)
-----   Message from Erick Colace, NP sent at 04/13/2019  9:26 AM EDT ----- Marykay Lex guys.  Need you to set up a follow-up CT chest without contrast for this patient in about 3 weeks.  I suspect she will be discharged this weekend and on 10/10 or 10/11  The CT needs to be sometime between Oct 29 -Nov 3  She has an appointment to See Dr  Tamala Julian Nov 5th at 1115 and the CT needs to be done before the appointment   Thanks!  Laurey Arrow

## 2019-04-13 NOTE — Progress Notes (Signed)
NAME:  Kelly Butler, MRN:  [id number], DOB:  26-Sep-1960, LOS: 3 ADMISSION DATE:  04/10/2019, CONSULTATION DATE:  04/10/19 REFERRING MD:  Sarajane Jews, CHIEF COMPLAINT:  SOB   Brief History   58 year old woman with history of asthma, OHS p/w increasing cough, congestion, pleurisy found to have R sided pneumonia and parapneumonic effusion.  Past Medical History   Past Medical History:  Diagnosis Date  . Abnormal heart rhythm   . Allergic rhinitis    skin test POS 11/08/08  . Asthma   . Disorder of vocal cord   . Fibromyalgia   . GERD (gastroesophageal reflux disease)   . Hypertension   . Sleep apnea    questionable     Significant Hospital Events   04/10/19 Admitted, abx started. Right sided US guide CT placed 10/8 CT placed to water seal 10/9: Chest tube removed Consults:  04/10/19 admitted   Procedures:  04/10/19>> right pigtail chest tube placement  Significant Diagnostic Tests:  10/6>> CXR enlarging R effusion and worsening R infiltrate 10/7>> CXR increasing bilat perihilar opacities, stable right basilar opacity 10/7>> CT Chest persistent right effusion, small right pneumo, patchy bilat atelectasis.  Mass-like consolidation over right base. Cytology = no malignant cells. Micro Data:  12/16/17 HIV neg 04/06/19 blood cx neg x 2 04/10/19 covid neg 04/10/19 blood cx no growth x 2 days 04/10/19 pleural fluid cx no growth x 2 days.   Antimicrobials:  Vanc 10/6>> Zosyn 10/6>> Azithromycin 10/6>>  Interim history/subjective:  Continues to look and feel better no acute distress tolerated chest tube removal well  Objective   Blood pressure 132/60, pulse 81, temperature 99.1 F (37.3 C), temperature source Oral, resp. rate (Abnormal) 24, height 5' (1.524 m), weight 83.9 kg, last menstrual period 10/17/2017, SpO2 95 %.        Intake/Output Summary (Last 24 hours) at 04/13/2019 0919 Last data filed at 04/13/2019 0700 Gross per 24 hour  Intake 745.22 ml  Output 2220 ml  Net  -1474.78 ml   Filed Weights   04/10/19 1500 04/10/19 1850  Weight: 83.6 kg 83.9 kg    Examination: General this is an obese 58 year old black female she is currently resting in bed and in no acute distress this morning HEENT normocephalic atraumatic no jugular venous distention appreciated mucous membranes are moist Pulmonary: Clear without rhonchi today no wheezing.  Right chest tube was without air leak prior to removal now has occlusive dressing over old chest tube site Cardiac: Regular rate and rhythm without murmur rub or gallop Abdomen: Soft nontender no organomegaly bowel sounds are positive Extremities: Warm and dry brisk capillary refill Neuro: Awake oriented no focal deficits appreciated   Resolved Hospital Problem list   NA  Assessment & Plan:   Acute hypoxemic respiratory failure secondary to pneumonia c/b exudative/loculated right effusion- ? CAP w/ parapneumonic effusion vs post-obstructive process -Follow-up chest x-ray obtained this a.m. on 10/9:No pneumothorax, chest tube was in satisfactory position right-sided airspace disease slowly improving there does appear to be element of pulmonary edema bilaterally today -Chest tube now removed Plan Continue to wean supplemental oxygen Would complete at least 8 days of antibiotics, she is currently on day #4 Zosyn and a azithromycin, discontinued vancomycin yesterday on 10/8 can discontinue a azithromycin tomorrow which will be 5 days Repeat chest x-ray this afternoon Continue pulmonary toilet Continue Demadex I will place a follow-up appointment in the computer we will orchestrate follow-up CT scan and decide in the outpatient setting on bronchoscopy should  right side airspace disease not clear  Hx asthma not clearly in bronchospasm Plan Continue Anora Ellipta, PRN DuoNeb  OHS/OSA not on CPAP Plan Supplemental oxygen  GERD Plan PPI  Anxiety/depression Plan [place]  She looks better I think she can move out of  the intensive care  Erick Colace ACNP-BC [place] Pager # [phone] OR # [phone] if no answer

## 2019-04-13 NOTE — Progress Notes (Signed)
PHARMACIST - PHYSICIAN COMMUNICATION DR:   Cyndia Skeeters CONCERNING: Antibiotic IV to Oral Route Change Policy  RECOMMENDATION: This patient is receiving azithromycin by the intravenous route.  Based on criteria approved by the Pharmacy and Therapeutics Committee, the antibiotic(s) is/are being converted to the equivalent oral dose form(s).   DESCRIPTION: These criteria include:  Patient being treated for a respiratory tract infection, urinary tract infection, cellulitis or clostridium difficile associated diarrhea if on metronidazole  The patient is not neutropenic and does not exhibit a GI malabsorption state  The patient is eating (either orally or via tube) and/or has been taking other orally administered medications for a least 24 hours  The patient is improving clinically and has a Tmax < 100.5  If you have questions about this conversion, please contact the Pharmacy Department  []   [phone] )  [facility] []   [phone] )  [facility] []   [phone] )  [facility] []   [phone] )  [facility] [x]   [phone] )  [facility]

## 2019-04-13 NOTE — Evaluation (Signed)
Physical Therapy Evaluation Patient Details Name: Kelly Butler MRN: [id number] DOB: Dec 22, 1960 Today's Date: 04/13/2019   History of Present Illness  58 yo female admitted to ED on 10/6 with worsening RLL PNA, seen in ED on 10/2 for PNA and pleural effusion currently on antibiotics with worsening symptoms. CT reveals worsening PNA, R midlung opacity concerning for underlying mass. Pt with chest tube placed 10/6, removed 10/9. PMH includes fibromyalgia, HTN, sleep apnea, respiratory failure, R/L, R and L heart cath and coronary angioplasty 2018.  Clinical Impression  Pt presents with increased time and effort to perform mobility tasks, unsteadiness in dynamic standing, decreased activity tolerance, and mild dyspnea without O2 desaturation during ambulation. Pt to benefit from acute PT to address deficits. Pt ambulated hallway distance without AD, with periods of unsteadiness. Pt weaker and more deconditioned vs PLOF, recommending HHPT to address deficits. PT to progress mobility as tolerated, and will continue to follow acutely.   SpO2 96% on 2LO2, dyspnea 2/4    Follow Up Recommendations Home health PT;Supervision for mobility/OOB    Equipment Recommendations  None recommended by PT    Recommendations for Other Services       Precautions / Restrictions Precautions Precautions: Fall(moderate) Precaution Comments: sats Restrictions Weight Bearing Restrictions: No      Mobility  Bed Mobility Overal bed mobility: Needs Assistance Bed Mobility: Supine to Sit     Supine to sit: Supervision;HOB elevated     General bed mobility comments: supervision for safety, increased time.  Transfers Overall transfer level: Needs assistance Equipment used: None Transfers: Sit to/from Stand Sit to Stand: Supervision;From elevated surface         General transfer comment: Supervision for safety, increased time and effort.  Ambulation/Gait Ambulation/Gait assistance: Min guard;Min  assist Gait Distance (Feet): 225 Feet Assistive device: None Gait Pattern/deviations: Step-through pattern;Decreased stride length;Wide base of support Gait velocity: decr   General Gait Details: Min guard for safety, occasionally min assist for steadying as pt with occasional posterior unsteadiness. SpO2 96% on 2LO2 immediately post-ambulation.  Stairs            Wheelchair Mobility    Modified Rankin (Stroke Patients Only)       Balance Overall balance assessment: Mild deficits observed, not formally tested;History of Falls                                           Pertinent Vitals/Pain Pain Assessment: No/denies pain    Home Living Family/patient expects to be discharged to:: Private residence Living Arrangements: Children(son) Available Help at Discharge: Family Type of Home: House Home Access: Stairs to enter   Technical brewer of Steps: a few Home Layout: One level Home Equipment: None      Prior Function Level of Independence: Independent               Hand Dominance   Dominant Hand: Right    Extremity/Trunk Assessment   Upper Extremity Assessment Upper Extremity Assessment: Overall WFL for tasks assessed    Lower Extremity Assessment Lower Extremity Assessment: Overall WFL for tasks assessed    Cervical / Trunk Assessment Cervical / Trunk Assessment: Normal  Communication   Communication: No difficulties  Cognition Arousal/Alertness: Awake/alert Behavior During Therapy: Flat affect Overall Cognitive Status: No family/caregiver present to determine baseline cognitive functioning  General Comments: Increased processing time, very flat affect and hard to read      General Comments      Exercises     Assessment/Plan    PT Assessment Patient needs continued PT services  PT Problem List Decreased mobility;Decreased activity tolerance;Decreased  balance;Pain;Cardiopulmonary status limiting activity       PT Treatment Interventions DME instruction;Therapeutic activities;Therapeutic exercise;Gait training;Patient/family education;Balance training;Stair training;Functional mobility training    PT Goals (Current goals can be found in the Care Plan section)  Acute Rehab PT Goals Patient Stated Goal: go home PT Goal Formulation: With patient Time For Goal Achievement: 04/27/19 Potential to Achieve Goals: Good    Frequency Min 3X/week   Barriers to discharge        Co-evaluation               AM-PAC PT "6 Clicks" Mobility  Outcome Measure Help needed turning from your back to your side while in a flat bed without using bedrails?: None Help needed moving from lying on your back to sitting on the side of a flat bed without using bedrails?: None Help needed moving to and from a bed to a chair (including a wheelchair)?: A Little Help needed standing up from a chair using your arms (e.g., wheelchair or bedside chair)?: A Little Help needed to walk in hospital room?: A Little Help needed climbing 3-5 steps with a railing? : A Little 6 Click Score: 20    End of Session Equipment Utilized During Treatment: Gait belt;Oxygen Activity Tolerance: Patient tolerated treatment well Patient left: in chair;with chair alarm set;with call bell/phone within reach Nurse Communication: Mobility status PT Visit Diagnosis: Unsteadiness on feet (R26.81);Other abnormalities of gait and mobility (R26.89)    Time: [id number] PT Time Calculation (min) (ACUTE ONLY): 23 min   Charges:   PT Evaluation $PT Eval Low Complexity: 1 Low PT Treatments $Gait Training: 8-22 mins        Julien Girt, PT Acute Rehabilitation Services Pager [phone]  Office [phone]   Kahlani Graber D Tani Virgo 04/13/2019, 5:14 PM

## 2019-04-13 NOTE — Progress Notes (Signed)
PROGRESS NOTE  Kelly Butler [id number] DOB: [birth year]   PCP: [name], PA-C  Patient is from: Home  DOA: 04/10/2019 LOS: 3  Brief Narrative / Interim history: 58 year old female with history of asthma, OSA/OHS not on CPAP, diastolic CHF sent to ED from pulmonology office for progressive dyspnea, cough and acute respiratory failure with hypoxia.   Patient presented to ED on 10/2 with 1 months history of progressive dyspnea, chest pain and productive cough.  Treated with Augmentin and doxycycline for CAP.  Presented to pulmonology office 10/6 for follow-up and desaturated to 80% on room air requiring 4 L.  Sent to ED for further evaluation.  In ED, chest x-ray showed worsening pneumonia with effusion.  RR in 55s.  Afebrile.  Normotensive.  Mild tachycardia.  Saturation 97% on room air.  Lactic acid and procalcitonin negative.  WBC 20.  Hgb 8.6.  Admitted for lobar pneumonia, moderate pulmonary effusion and acute respiratory failure with hypoxia.  Pulmonology consulted and chest tube placed with removal of exudative fluid by light's criteria.  Blood, fluid and sputum cultures negative so far.  No malignant cells on cytology.  Chest tube removed on 10/9.  Patient had CT chest 10/7 revealed masslike infiltrate or questionable mass.  Plan is to continue current antibiotic course for a total of 8 days and follow-up CT chest in 3 to 4 weeks which has been arranged by PCCM.  May need bronchoscopy for further evaluation if consolidation/infiltrate persists.  Subjective: No major events overnight of this morning.  Chest tube removed this morning.  Reports pain all over her chest.  Describes the pain as sharp.  She reports improvement in a chest pain but rates it as 9/10 although she does not seem to be in that much distress.  She does not seem to be a great historian.  Objective: Vitals:   04/13/19 0800 04/13/19 0810 04/13/19 0830 04/13/19 1104  BP: (!) 142/86   115/65  Pulse:    83  Resp: (!)  28   16  Temp:   99.1 F (37.3 C) 98.1 F (36.7 C)  TempSrc:   Oral Oral  SpO2:  95%  100%  Weight:      Height:        Intake/Output Summary (Last 24 hours) at 04/13/2019 1335 Last data filed at 04/13/2019 0900 Gross per 24 hour  Intake 381.28 ml  Output 2220 ml  Net -1838.72 ml   Filed Weights   04/10/19 1500 04/10/19 1850  Weight: 83.6 kg 83.9 kg    Examination:  GENERAL: No acute distress.  Appears well.  HEENT: MMM.  Vision and hearing grossly intact.  NECK: Supple.  No apparent JVD.  RESP:  No IWOB.  Diminished aeration on the right. CVS:  RRR. Heart sounds normal.  ABD/GI/GU: Bowel sounds present. Soft. Non tender.  MSK/EXT:  Moves extremities. No apparent deformity or edema.  SKIN: no apparent skin lesion or wound NEURO: Awake, alert and oriented appropriately.  No gross deficit.  PSYCH: Calm. Normal affect.   Assessment & Plan: Acute respiratory failure with hypoxia due to right lobar pneumonia and parapneumonic effusion.  COVID-19 negative.  Blood, sputum and pleural fluid cultures negative so far. -CTA chest on 10/2 negative for PE but dense R sided mass-like consolidation and mediastinal adenopathy -Status post chest tube placement by pulmonology 10/6.  Fluid exudative by light criteria.  Culture negative. -CT chest on 10/7 with masslike right lung base consolidation likely infection, question mass. -On vancomycin and  Zosyn 10/6-10/9. MRSA PCR negative.  Augmentin 10/9>> -Azithromycin 10/6>>>.   -Chest pain removed 10/9 -Appreciate pulmonology guidance -PT/OT evaluate  Abnormal CT chest as above. -Pulmonology arranging repeat CT chest in 3 to 4 weeks outpatient after antibiotic course.  Leukocytosis/bandemia: Likely due to the above. -Continue trending  Asthma, OSA, OHS not on CPAP -Continue bronchodilators and Singulair  Chronic diastolic CHF: Previously with systolic CHF.  Appears euvolemic. -Continue home torsemide and spironolactone -Closely  monitor fluid status.  Hypokalemia: -Replenish and recheck.  Anemia of chronic disease: H&H stable. -Continue monitoring  GERD -Continue PPI  Thrombocytosis: Likely reactive. -Continue monitoring  Anxiety/depression: Stable. -Continue home medications.    DVT prophylaxis: SCD Code Status: Full code Family Communication: Patient and/or RN. Available if any question. Disposition Plan: Remains inpatient.  Anticipate discharge in the next 24 to 48 hours.  Final disposition per PT eval. Consultants: Pulmonology  Procedures:  10/6-chest tube placement  Microbiology summarized: 10/6-COVID-19 negative. 10/6-blood culture negative so far 10/6-sputum culture negative so far 10/6-pleural fluid culture negative so far 10/6-MRSA PCR negative  Sch Meds:  Scheduled Meds: . brimonidine  1 drop Both Eyes BID  . Chlorhexidine Gluconate Cloth  6 each Topical Daily  . citalopram  20 mg Oral Daily  . ipratropium-albuterol  3 mL Nebulization QID  . latanoprost  1 drop Both Eyes QHS  . lurasidone  20 mg Oral QPM  . metoprolol tartrate  25 mg Oral BID  . mirtazapine  30 mg Oral QHS  . montelukast  10 mg Oral QHS  . pantoprazole  40 mg Oral BID  . sodium chloride flush  3 mL Intravenous Q12H  . sodium chloride flush  3 mL Intravenous Q12H  . spironolactone  25 mg Oral Daily  . torsemide  20 mg Oral BID  . umeclidinium-vilanterol  1 puff Inhalation Daily   Continuous Infusions: . sodium chloride    . azithromycin Stopped (04/12/19 1313)  . piperacillin-tazobactam Stopped (04/13/19 0935)   PRN Meds:.sodium chloride, acetaminophen **OR** acetaminophen, ALPRAZolam, HYDROcodone-acetaminophen, hydrOXYzine, ipratropium-albuterol, sodium chloride flush  Antimicrobials: Anti-infectives (From admission, onward)   Start     Dose/Rate Route Frequency Ordered Stop   04/11/19 1200  azithromycin (ZITHROMAX) 500 mg in sodium chloride 0.9 % 250 mL IVPB     500 mg 250 mL/hr over 60 Minutes  Intravenous Every 24 hours 04/10/19 1418     04/11/19 0400  vancomycin (VANCOCIN) IVPB 750 mg/150 ml premix  Status:  Discontinued     750 mg 150 mL/hr over 60 Minutes Intravenous Every 12 hours 04/10/19 1643 04/12/19 1014   04/10/19 1430  cefTRIAXone (ROCEPHIN) 2 g in sodium chloride 0.9 % 100 mL IVPB  Status:  Discontinued     2 g 200 mL/hr over 30 Minutes Intravenous Every 24 hours 04/10/19 1425 04/10/19 1604   04/10/19 1430  azithromycin (ZITHROMAX) 500 mg in sodium chloride 0.9 % 250 mL IVPB  Status:  Discontinued     500 mg 250 mL/hr over 60 Minutes Intravenous Every 24 hours 04/10/19 1425 04/10/19 1908   04/10/19 1430  vancomycin (VANCOCIN) 1,750 mg in sodium chloride 0.9 % 500 mL IVPB     1,750 mg 250 mL/hr over 120 Minutes Intravenous  Once 04/10/19 1357 04/10/19 1723   04/10/19 1430  piperacillin-tazobactam (ZOSYN) IVPB 3.375 g     3.375 g 12.5 mL/hr over 240 Minutes Intravenous Every 8 hours 04/10/19 1413     04/10/19 1115  cefTRIAXone (ROCEPHIN) 1 g in sodium chloride 0.9 %  100 mL IVPB     1 g 200 mL/hr over 30 Minutes Intravenous  Once 04/10/19 1108 04/10/19 1205   04/10/19 1115  azithromycin (ZITHROMAX) 500 mg in sodium chloride 0.9 % 250 mL IVPB     500 mg 250 mL/hr over 60 Minutes Intravenous  Once 04/10/19 1108 04/10/19 1320       I have personally reviewed the following labs and images: CBC: Recent Labs  Lab 04/10/19 1035 04/11/19 0518 04/12/19 0211 04/13/19 0155  WBC 20.4* 17.1* 12.6* 13.4*  NEUTROABS 15.6*  --   --   --   HGB 8.6* 8.9* 8.3* 8.7*  HCT 30.1* 31.3* 29.2* 30.9*  MCV 81.6 83.5 82.3 83.1  PLT 539* 455* 543* 579*   BMP &GFR Recent Labs  Lab 04/10/19 1035 04/11/19 0518 04/12/19 0211 04/13/19 0155  NA 134* 138 136 137  K 3.6 3.7 3.4* 3.5  CL 101 102 101 99  CO2 22 25 23 25   GLUCOSE 171* 135* 121* 118*  BUN 11 9 11 12   CREATININE 0.68 0.78 0.77 0.85  CALCIUM 9.4 9.6 9.0 9.2  MG  --  1.7 1.7 1.4*   Estimated Creatinine Clearance:  71.1 mL/min (by C-G formula based on SCr of 0.85 mg/dL). Liver & Pancreas: No results for input(s): AST, ALT, ALKPHOS, BILITOT, PROT, ALBUMIN in the last 168 hours. No results for input(s): LIPASE, AMYLASE in the last 168 hours. No results for input(s): AMMONIA in the last 168 hours. Diabetic: No results for input(s): HGBA1C in the last 72 hours. No results for input(s): GLUCAP in the last 168 hours. Cardiac Enzymes: No results for input(s): CKTOTAL, CKMB, CKMBINDEX, TROPONINI in the last 168 hours. No results for input(s): PROBNP in the last 8760 hours. Coagulation Profile: No results for input(s): INR, PROTIME in the last 168 hours. Thyroid Function Tests: No results for input(s): TSH, T4TOTAL, FREET4, T3FREE, THYROIDAB in the last 72 hours. Lipid Profile: No results for input(s): CHOL, HDL, LDLCALC, TRIG, CHOLHDL, LDLDIRECT in the last 72 hours. Anemia Panel: No results for input(s): VITAMINB12, FOLATE, FERRITIN, TIBC, IRON, RETICCTPCT in the last 72 hours. Urine analysis:    Component Value Date/Time   COLORURINE YELLOW 04/14/2016 1030   APPEARANCEUR CLEAR 04/14/2016 1030   LABSPEC 1.015 04/14/2016 1030   PHURINE 7.5 04/14/2016 1030   GLUCOSEU NEGATIVE 04/14/2016 1030   HGBUR NEGATIVE 04/14/2016 [place] 04/14/2016 1030   [place] 04/14/2016 1030   PROTEINUR NEGATIVE 04/14/2016 1030   UROBILINOGEN 1.0 08/23/2012 1432   NITRITE NEGATIVE 04/14/2016 1030   LEUKOCYTESUR NEGATIVE 04/14/2016 1030   Sepsis Labs: Invalid input(s): PROCALCITONIN, [place]  Microbiology: Recent Results (from the past 240 hour(s))  SARS Coronavirus 2 [place] order, Performed in [facility] hospital lab) Nasopharyngeal Nasopharyngeal Swab     Status: None   Collection Time: 04/06/19 11:49 AM   Specimen: Nasopharyngeal Swab  Result Value Ref Range Status   SARS Coronavirus 2 NEGATIVE NEGATIVE Final    Comment: (NOTE) If result is NEGATIVE SARS-CoV-2 target  nucleic acids are NOT DETECTED. The SARS-CoV-2 RNA is generally detectable in upper and lower  respiratory specimens during the acute phase of infection. The lowest  concentration of SARS-CoV-2 viral copies this assay can detect is 250  copies / mL. A negative result does not preclude SARS-CoV-2 infection  and should not be used as the sole basis for treatment or other  patient management decisions.  A negative result may occur with  improper specimen collection /  handling, submission of specimen other  than nasopharyngeal swab, presence of viral mutation(s) within the  areas targeted by this assay, and inadequate number of viral copies  (<250 copies / mL). A negative result must be combined with clinical  observations, patient history, and epidemiological information. If result is POSITIVE SARS-CoV-2 target nucleic acids are DETECTED. The SARS-CoV-2 RNA is generally detectable in upper and lower  respiratory specimens dur ing the acute phase of infection.  Positive  results are indicative of active infection with SARS-CoV-2.  Clinical  correlation with patient history and other diagnostic information is  necessary to determine patient infection status.  Positive results do  not rule out bacterial infection or co-infection with other viruses. If result is PRESUMPTIVE POSTIVE SARS-CoV-2 nucleic acids MAY BE PRESENT.   A presumptive positive result was obtained on the submitted specimen  and confirmed on repeat testing.  While 2019 novel coronavirus  (SARS-CoV-2) nucleic acids may be present in the submitted sample  additional confirmatory testing may be necessary for epidemiological  and / or clinical management purposes  to differentiate between  SARS-CoV-2 and other Sarbecovirus currently known to infect humans.  If clinically indicated additional testing with an alternate test  methodology [phone]) is advised. The SARS-CoV-2 RNA is generally  detectable in upper and lower  respiratory sp ecimens during the acute  phase of infection. The expected result is Negative. Fact Sheet for Patients:  StrictlyIdeas.no Fact Sheet for Healthcare Providers: BankingDealers.co.za This test is not yet approved or cleared by the [place] FDA and has been authorized for detection and/or diagnosis of SARS-CoV-2 by FDA under an Emergency Use Authorization (EUA).  This EUA will remain in effect (meaning this test can be used) for the duration of the COVID-19 declaration under Section 564(b)(1) of the Act, 21 U.S.C. section 360bbb-3(b)(1), unless the authorization is terminated or revoked sooner. Performed at [facility], [place] [address] [place], [place] [address]   Blood culture (routine x 2)     Status: None   Collection Time: 04/06/19  2:40 PM   Specimen: BLOOD LEFT FOREARM  Result Value Ref Range Status   Specimen Description   Final    BLOOD LEFT FOREARM Performed at [facility], [place] [address], [place], [place] [address]    Special Requests   Final    BOTTLES DRAWN AEROBIC AND ANAEROBIC Blood Culture adequate volume Performed at [place] [address] [place], [place] [address]    Culture   Final    NO GROWTH 5 DAYS Performed at [facility], [place] [address] [place], [place] [address]    Report Status 04/11/2019 FINAL  Final  Blood culture (routine x 2)     Status: None   Collection Time: 04/06/19  2:40 PM   Specimen: BLOOD RIGHT FOREARM  Result Value Ref Range Status   Specimen Description   Final    BLOOD RIGHT FOREARM Performed at [facility], [place] [address], [place], [place] [address]    Special Requests   Final    BOTTLES DRAWN AEROBIC AND ANAEROBIC Blood Culture adequate volume Performed at [place] [address] [place], [place] [address]    Culture   Final    NO GROWTH 5 DAYS Performed at [facility], [place] [address], [place], [place] [address]    Report Status 04/11/2019 FINAL  Final  Blood culture (routine x 2)     Status:  None (Preliminary result)   Collection Time: 04/10/19 10:35 AM   Specimen: BLOOD  Result Value Ref Range Status   Specimen Description   Final    BLOOD RIGHT ANTECUBITAL Performed at [place] [address] [place], [place] [address]    Special Requests   Final    BOTTLES DRAWN AEROBIC AND ANAEROBIC Blood Culture adequate volume Performed at [place] [address], [place], [place] [address]    Culture   Final    NO GROWTH 3 DAYS Performed at [facility], [place] [address] [place], [place] [address]    Report Status PENDING  Incomplete  SARS Coronavirus 2 [facility] order, Performed in [facility] hospital lab) Nasopharyngeal Nasopharyngeal Swab     Status: None   Collection Time: 04/10/19 10:47 AM   Specimen: Nasopharyngeal Swab  Result Value Ref Range Status   SARS Coronavirus 2 NEGATIVE NEGATIVE Final    Comment: (NOTE) If result is NEGATIVE SARS-CoV-2 target nucleic acids are NOT DETECTED. The SARS-CoV-2 RNA is generally detectable in upper and lower  respiratory specimens during the acute phase of infection. The lowest  concentration of SARS-CoV-2 viral copies this assay can detect is 250  copies / mL. A negative result does not preclude SARS-CoV-2 infection  and should not be used as the sole basis for treatment or other  patient management decisions.  A negative result may occur with  improper specimen collection / handling, submission of specimen other  than nasopharyngeal swab, presence of viral mutation(s) within the  areas targeted by this assay, and inadequate number of viral copies  (<250 copies / mL). A negative result must be combined with clinical  observations, patient history, and epidemiological information. If result is POSITIVE SARS-CoV-2 target nucleic acids are  DETECTED. The SARS-CoV-2 RNA is generally detectable in upper and lower  respiratory specimens dur ing the acute phase of infection.  Positive  results are indicative of active infection with SARS-CoV-2.  Clinical  correlation with patient history and other diagnostic information is  necessary to determine patient infection status.  Positive results do  not rule out bacterial infection or co-infection with other viruses. If result is PRESUMPTIVE POSTIVE SARS-CoV-2 nucleic acids MAY BE PRESENT.   A presumptive positive result was obtained on the submitted specimen  and confirmed on repeat testing.  While 2019 novel coronavirus  (SARS-CoV-2) nucleic acids may be present in the submitted sample  additional confirmatory testing may be necessary for epidemiological  and / or clinical management purposes  to differentiate between  SARS-CoV-2 and other Sarbecovirus currently known to infect humans.  If clinically indicated additional testing with an alternate test  methodology [phone]) is advised. The SARS-CoV-2 RNA is generally  detectable in upper and lower respiratory sp ecimens during the acute  phase of infection. The expected result is Negative. Fact Sheet for Patients:  StrictlyIdeas.no Fact Sheet for Healthcare Providers: BankingDealers.co.za This test is not yet approved or cleared by the [place] FDA and has been authorized for detection and/or diagnosis of SARS-CoV-2 by FDA under an Emergency Use Authorization (EUA).  This EUA will remain in effect (meaning this test can be used) for the duration of the COVID-19 declaration under Section 564(b)(1) of the Act, 21 U.S.C. section 360bbb-3(b)(1), unless the authorization is terminated or revoked sooner. Performed at [facility], [place] [address], [place], [place] [address]   Body fluid culture (includes gram stain)     Status: None (Preliminary  result)    Collection Time: 04/10/19  2:31 PM   Specimen: Pleural Fluid  Result Value Ref Range Status   Specimen Description   Final    PLEURAL Performed at [place] [address], [place], [place] [address]    Special Requests   Final    NONE Performed at [facility], [place] [address], [place], [place] [address]    Gram Stain   Final    FEW WBC PRESENT,BOTH PMN AND MONONUCLEAR NO ORGANISMS SEEN    Culture   Final    NO GROWTH 3 DAYS Performed at [facility], [place] [address], [place], [place] [address]    Report Status PENDING  Incomplete  MRSA PCR Screening     Status: None   Collection Time: 04/10/19  6:48 PM   Specimen: Nasal Mucosa; Nasopharyngeal  Result Value Ref Range Status   MRSA by PCR NEGATIVE NEGATIVE Final    Comment:        The GeneXpert MRSA Assay (FDA approved for NASAL specimens only), is one component of a comprehensive MRSA colonization surveillance program. It is not intended to diagnose MRSA infection nor to guide or monitor treatment for MRSA infections. Performed at [facility], [place] [address], [place], [place] [address]   Blood culture (routine x 2)     Status: None (Preliminary result)   Collection Time: 04/10/19  6:56 PM   Specimen: BLOOD  Result Value Ref Range Status   Specimen Description   Final    BLOOD LEFT ANTECUBITAL Performed at [place] [address], [place], [place] [address]    Special Requests   Final    BOTTLES DRAWN AEROBIC ONLY Blood Culture adequate volume Performed at [place] [address] [place], [place] [address]    Culture   Final    NO GROWTH 3 DAYS Performed at [facility], [place] [address], [place], [place] [address]    Report Status PENDING  Incomplete  Expectorated sputum assessment w rflx to resp cult     Status: None   Collection Time: 04/11/19 11:49 AM   Specimen: Sputum  Result Value Ref  Range Status   Specimen Description SPUTUM  Final   Special Requests Normal  Final   Sputum evaluation   Final    THIS SPECIMEN IS ACCEPTABLE FOR SPUTUM CULTURE Performed at [facility], [place] [address] [place], [place] [address]    Report Status 04/11/2019 FINAL  Final  Culture, respiratory     Status: None   Collection Time: 04/11/19 11:49 AM   Specimen: SPU  Result Value Ref Range Status   Specimen Description   Final    SPUTUM Performed at [place] [address], [place], [place] [address]    Special Requests   Final    Normal Reflexed from [id number] Performed at [facility], [place] [address], [place], [place] [address]    Gram Stain   Final    RARE WBC PRESENT, PREDOMINANTLY MONONUCLEAR FEW SQUAMOUS EPITHELIAL CELLS PRESENT NO ORGANISMS SEEN    Culture   Final    FEW Consistent with normal respiratory flora. Performed at [facility], [place] [address] [place], [place] [address]    Report Status 04/13/2019 FINAL  Final    Radiology Studies: Dg Chest Port 1 View  Result Date: 04/13/2019 CLINICAL DATA:  58 year old female with history of pneumothorax. EXAM:  PORTABLE CHEST 1 VIEW COMPARISON:  Chest x-ray 04/12/2019. FINDINGS: Right-sided chest tube in position with tip projecting over the lower right hemithorax. No appreciable right-sided pneumothorax identified at this time. Patchy multifocal opacities throughout the right mid to lower lung and in the left mid lung, which may reflect areas of atelectasis and/or consolidation. Probable small right pleural effusion. No left pleural effusion. No evidence of pulmonary edema. Heart size is mildly enlarged. Upper mediastinal contours are within normal limits. IMPRESSION: 1. Support apparatus, as above. 2. No right-sided pneumothorax. 3. Persistent patchy multifocal areas of atelectasis and/or airspace consolidation, as above. 4. Probable small right pleural effusion.  Electronically Signed   By: Vinnie Langton M.D.   On: 04/13/2019 08:42   Dg Chest Port 1 View  Result Date: 04/12/2019 CLINICAL DATA:  Patient with history of pneumothorax. EXAM: PORTABLE CHEST 1 VIEW COMPARISON:  Chest radiograph 04/12/2019 FINDINGS: Monitoring leads overlie the patient. Stable cardiomegaly. Right chest tube remains in position. Similar-appearing patchy consolidation right mid lower lung with linear opacities left mid lung. Possible small right pleural effusion. No definite pneumothorax. IMPRESSION: Similar-appearing patchy consolidative opacities within the right mid and lower lung. No definite pneumothorax. Right chest tube remains in place. Electronically Signed   By: Lovey Newcomer M.D.   On: 04/12/2019 16:03     T. [place]  If 7PM-7AM, please contact night-coverage [email or web address] Password [email or web address] 04/13/2019, 1:35 PM

## 2019-04-13 NOTE — Telephone Encounter (Signed)
Ct chest without ordered as requested below

## 2019-04-14 LAB — BASIC METABOLIC PANEL
Anion gap: 12 (ref 5–15)
BUN: 15 mg/dL (ref 6–20)
CO2: 27 mmol/L (ref 22–32)
Calcium: 9.4 mg/dL (ref 8.9–10.3)
Chloride: 98 mmol/L (ref 98–111)
Creatinine, Ser: 0.69 mg/dL (ref 0.44–1.00)
GFR calc Af Amer: >60 mL/min (ref >60)
GFR calc non Af Amer: >60 mL/min (ref >60)
Glucose, Bld: 153 mg/dL — ABNORMAL HIGH (ref 70–99)
Potassium: 3.4 mmol/L — ABNORMAL LOW (ref 3.5–5.1)
Sodium: 137 mmol/L (ref 135–145)

## 2019-04-14 LAB — CBC
HCT: 29.7 % — ABNORMAL LOW (ref 36.0–46.0)
Hemoglobin: 8.3 g/dL — ABNORMAL LOW (ref 12.0–15.0)
MCH: 23.2 pg — ABNORMAL LOW (ref 26.0–34.0)
MCHC: 27.9 g/dL — ABNORMAL LOW (ref 30.0–36.0)
MCV: 83 fL (ref 80.0–100.0)
Platelets: 503 10*3/uL — ABNORMAL HIGH (ref 150–400)
RBC: 3.58 MIL/uL — ABNORMAL LOW (ref 3.87–5.11)
RDW: 17.2 % — ABNORMAL HIGH (ref 11.5–15.5)
WBC: 10.4 10*3/uL (ref 4.0–10.5)
nRBC: 0 % (ref 0.0–0.2)

## 2019-04-14 LAB — BODY FLUID CULTURE: Culture: NO GROWTH

## 2019-04-14 LAB — MAGNESIUM: Magnesium: 1.9 mg/dL (ref 1.7–2.4)

## 2019-04-14 MED ORDER — IPRATROPIUM-ALBUTEROL 0.5-2.5 (3) MG/3ML IN SOLN
3.0000 mL | Freq: Three times a day (TID) | RESPIRATORY_TRACT | Status: DC
  Administered 2019-04-14: 3 mL via RESPIRATORY_TRACT
  Filled 2019-04-14 (×2): qty 3

## 2019-04-14 MED ORDER — ISOSORBIDE MONONITRATE ER 30 MG PO TB24: 30.0000 mg | ORAL_TABLET | Freq: Every day | ORAL | 3 refills | Status: DC

## 2019-04-14 MED ORDER — POTASSIUM CHLORIDE CRYS ER 20 MEQ PO TBCR
40.0000 meq | EXTENDED_RELEASE_TABLET | Freq: Once | ORAL | Status: AC
  Administered 2019-04-14: 10:00:00 40 meq via ORAL
  Filled 2019-04-14: qty 2

## 2019-04-14 MED ORDER — AZITHROMYCIN 500 MG PO TABS: 500.0000 mg | ORAL_TABLET | Freq: Every day | ORAL | 0 refills | Status: AC

## 2019-04-14 MED ORDER — AMOXICILLIN-POT CLAVULANATE 875-125 MG PO TABS: 1.0000 | ORAL_TABLET | Freq: Two times a day (BID) | ORAL | 0 refills | Status: AC

## 2019-04-14 NOTE — Progress Notes (Signed)
SATURATION QUALIFICATIONS: (This note is used to comply with reg  Patient Saturations on Room Air at Rest = 90%  Patient Saturations on Room Air while Ambulating = 88%  Patient Saturations on 1 Liters of oxygen while Ambulating   Please briefly explain why patient needs home oxygen:  Patient oxygen level drops to 88% while ambulating on RA

## 2019-04-14 NOTE — TOC Progression Note (Signed)
Transition of Care [facility]) - Progression Note    Patient Details  Name: Kelly Butler MRN: [id number] Date of Birth: 02-21-1961  [facility]) CM/SW Contact  Darlin Stenseth, Juliann Pulse, RN Phone Number: 04/14/2019, 3:22 PM  Clinical Narrative: Ordered for HHPT-await to find [facility] agency to accept. Noted 02 order-[employer] rep [place] to deliver home 02 travel tank to rm prior d/c. Nurse updated.           Expected Discharge Plan and Services           Expected Discharge Date: 04/14/19                                     Social Determinants of Health (SDOH) Interventions    Readmission Risk Interventions No flowsheet data found.

## 2019-04-14 NOTE — TOC Transition Note (Signed)
Transition of Care [facility]) - CM/SW Discharge Note   Patient Details  Name: Kelly Butler MRN: [id number] Date of Birth: Feb 16, 1961  Transition of Care [facility]) CM/SW Contact:  Dessa Phi, RN Phone Number: 04/14/2019, 4:34 PM   Clinical Narrative:   [facility] rep Melissa for HHPT. [employer] for home oxygen-rep [employer] aware to deliver home 02 to rm prior d/c    Final next level of care: [place]     Patient Goals and CMS Choice        Discharge Placement                       Discharge Plan and Services                                     Social Determinants of Health (SDOH) Interventions     Readmission Risk Interventions No flowsheet data found.

## 2019-04-14 NOTE — Plan of Care (Signed)

## 2019-04-14 NOTE — Plan of Care (Signed)

## 2019-04-14 NOTE — Discharge Summary (Signed)
Physician Discharge Summary   Patient ID: Kelly Butler MRN: [id number] DOB/AGE: 07-Aug-1960 58 y.o.  Admit date: 04/10/2019 Discharge date: 04/14/2019  Primary Care Physician:  [name], PA-C   Recommendations for Outpatient Follow-up:  1. Follow up with PCP in 1-2 weeks 2. Outpatient follow-up with pulmonology scheduled  Home Health: Home O2 evaluation pending prior to discharge Equipment/Devices: Home health PT  Discharge Condition: stable CODE STATUS: FULL Diet recommendation: Heart healthy diet   Discharge Diagnoses:    Acute respiratory failure with hypoxia Right lobar pneumonia Parapneumonic effusion Leukocytosis Chronic diastolic CHF . Hypokalemia . Allergic asthma, moderate persistent, uncomplicated . Obesity hypoventilation syndrome ([place]) . Obstructive sleep apnea- slight GERD Anemia of chronic disease Thrombocytosis, likely reactive  Consults: Pulmonology    Allergies:   Allergies  Allergen Reactions  . Celebrex [Celecoxib] Swelling and Rash  . Pregabalin Other (See Comments)    *LYRICA* REACTION: itching, swelling *LYRICA* REACTION: itching, swelling  . Sulfa Antibiotics Other (See Comments)     DISCHARGE MEDICATIONS: Allergies as of 04/14/2019      Reactions   Celebrex [celecoxib] Swelling, Rash   Pregabalin Other (See Comments)   *LYRICA* REACTION: itching, swelling *LYRICA* REACTION: itching, swelling   Sulfa Antibiotics Other (See Comments)      Medication List    TAKE these medications   albuterol 108 (90 Base) MCG/ACT inhaler Commonly known as: ProAir HFA TAKE 2 PUFFS EVERY 4 HOURS AS NEEDED -RESCUE What changed:   how much to take  how to take this  when to take this  reasons to take this  additional instructions   Alphagan P 0.15 % ophthalmic solution Generic drug: brimonidine Place 1 drop into both eyes 2 (two) times daily.   ALPRAZolam 0.5 MG tablet Commonly known as: XANAX Take 0.5 mg by mouth 3 (three)  times daily as needed (anxiety). Anxiety.   amoxicillin-clavulanate 875-125 MG tablet Commonly known as: AUGMENTIN Take 1 tablet by mouth 2 (two) times daily for 5 days.   Anoro Ellipta 62.5-25 MCG/INH Aepb Generic drug: umeclidinium-vilanterol INHALE 1 PUFF INTO THE LUNGS DAILY. What changed:   how much to take  how to take this  when to take this  additional instructions   azithromycin 500 MG tablet Commonly known as: ZITHROMAX Take 1 tablet (500 mg total) by mouth daily for 5 days.   citalopram 20 MG tablet Commonly known as: CELEXA Take 20 mg by mouth daily.   EPINEPHrine 0.3 mg/0.3 mL Soaj injection Commonly known as: EPI-PEN Inject into the thigh once for severe allergic reaction.   Fasenra 30 MG/ML Sosy Generic drug: Benralizumab Inject 30 mg into the skin every 8 (eight) weeks.   fluconazole 150 MG tablet Commonly known as: DIFLUCAN TAKE 1 TABLET BY MOUTH AS ONE DOSE What changed: See the new instructions.   hydrOXYzine 25 MG capsule Commonly known as: VISTARIL Take 25 mg by mouth 3 (three) times daily as needed for anxiety.   ipratropium-albuterol 0.5-2.5 (3) MG/3ML Soln Commonly known as: DUONEB USE 1 VIAL VIA NEBULIZER EVERY 4 HRS AS NEEDED What changed:   how much to take  how to take this  when to take this  reasons to take this  additional instructions   isosorbide mononitrate 30 MG 24 hr tablet Commonly known as: IMDUR Take 1 tablet (30 mg total) by mouth daily.   latanoprost 0.005 % ophthalmic solution Commonly known as: XALATAN Place 1 drop into both eyes at bedtime.   Latuda 40 MG Tabs tablet  Generic drug: lurasidone Take 20 mg by mouth every evening.   losartan 100 MG tablet Commonly known as: COZAAR Take 1 tablet (100 mg total) by mouth daily.   methocarbamol 500 MG tablet Commonly known as: ROBAXIN Take 1 tablet (500 mg total) by mouth 2 (two) times daily. May cause drowsiness   metoprolol tartrate 25 MG  tablet Commonly known as: LOPRESSOR Take 1 tablet (25 mg total) by mouth 2 (two) times daily.   mirtazapine 30 MG tablet Commonly known as: REMERON Take 30 mg by mouth at bedtime.   montelukast 10 MG tablet Commonly known as: SINGULAIR TAKE 1 TABLET BY MOUTH EVERY DAY IN THE MORNING What changed:   how much to take  how to take this  when to take this  additional instructions   omeprazole 40 MG capsule Commonly known as: PRILOSEC Take 40 mg by mouth 2 (two) times daily.   ondansetron 4 MG tablet Commonly known as: ZOFRAN Take 4 mg by mouth every 8 (eight) hours as needed for nausea or vomiting.   spironolactone 25 MG tablet Commonly known as: ALDACTONE Take 1 tablet (25 mg total) by mouth daily.   torsemide 20 MG tablet Commonly known as: DEMADEX Take 1 tablet (20 mg total) by mouth 2 (two) times daily.   zolpidem 10 MG tablet Commonly known as: AMBIEN Take 1 tablet (10 mg total) by mouth at bedtime.        Brief H and P: For complete details please refer to admission H and P, but in brief 58 year old female with history of asthma, OSA/OHS not on CPAP, diastolic CHF sent to ED from pulmonology office for progressive dyspnea, cough and acute respiratory failure with hypoxia. Patient presented to ED on 10/2 with 1 months history of progressive dyspnea, chest pain and productive cough.  Treated with Augmentin and doxycycline for CAP.  Presented to pulmonology office 10/6 for follow-up and desaturated to 80% on room air requiring 4 L.  Sent to ED for further evaluation. Chest x-ray showed worsening pneumonia with effusion, WBC 20, respiratory rate in 30s at the time of admission, hypoxia.  Hospital Course:   Acute respiratory failure with hypoxia secondary to right lobar pneumonia, parapneumonic effusion  -COVID-19 test negative, blood cultures sputum culture and pleural fluid cultures negative so far -CT angiogram of the chest on 10/2 was negative for PE but  showed dense right-sided masslike consolidation and mediastinal adenopathy -Pulmonology was consulted and patient underwent chest tube placement on 10/6, fluid exudative, culture negative -CT chest on 10/7 with masslike right lung base consolidation likely infection, question mass -Patient was placed on vancomycin and Zosyn from 10/6 to-10/9.  MRSA PCR negative.  Patient was transitioned to Augmentin and Zithromax. -Chest tube removed on 10/9 -Home O2 evaluation pending prior to discharge, will continue antibiotics for another 5 days for uncomplicated parapneumonic effusion. -Pulmonology follow-up arranged to, will need repeat CT chest in 3 to 4 weeks outpatient off with antibiotic  Leukocytosis/bandemia Likely due to #1, repeat CBC  History of asthma, OSA, OHS -Continue bronchodilators, Singulair -Not on CPAP  Chronic diastolic CHF  Appears euvolemic, continue home torsemide, Aldactone  Anemia of chronic disease Currently stable   GERD -Continue PPI  Thrombocytosis -Continue monitoring, likely reactive   Anxiety/depression: Stable. -Continue home medications.      Day of Discharge S: Overall feels better, hoping to go home.  No fevers or chills/ chest pain, nausea vomiting.  BP 121/63 (BP Location: Left Arm)   Pulse 73  Temp 98 F (36.7 C) (Oral)   Resp 18   Ht 5' (1.524 m)   Wt 83.9 kg   LMP 10/17/2017 Comment: tubal ligation  SpO2 94%   BMI 36.12 kg/m   Physical Exam: General: Alert and awake oriented x3 not in any acute distress. HEENT: anicteric sclera, pupils reactive to light and accommodation CVS: S1-S2 clear no murmur rubs or gallops Chest: clear to auscultation bilaterally, no wheezing rales or rhonchi Abdomen: soft nontender, nondistended, normal bowel sounds Extremities: no cyanosis, clubbing or edema noted bilaterally Neuro: Cranial nerves II-XII intact, no focal neurological deficits   The results of significant diagnostics from this  hospitalization (including imaging, microbiology, ancillary and laboratory) are listed below for reference.      Procedures/Studies:  Dg Chest 1 View  Result Date: 04/11/2019 CLINICAL DATA:  Chest tube. EXAM: CHEST  1 VIEW COMPARISON:  Radiograph of April 10, 2019. FINDINGS: Stable cardiomegaly. Increased bilateral perihilar opacities are noted concerning for worsening edema or infiltrates. Stable position of right-sided basilar chest tube with stable right basilar opacity, concerning for atelectasis and/or effusion. Bony thorax is unremarkable. IMPRESSION: Stable position of right basilar chest tube with stable right basilar atelectasis and/or effusion. Increased bilateral perihilar opacities are noted concerning for worsening edema or inflammation. Electronically Signed   By: Marijo Conception M.D.   On: 04/11/2019 07:56   Dg Chest 1 View  Result Date: 04/10/2019 CLINICAL DATA:  Right-sided chest tube placement. EXAM: CHEST  1 VIEW COMPARISON:  04/10/2019 at 10:57 a.m. FINDINGS: Since the earlier study, a pigtail chest tube has been placed in the inferior right hemithorax. There has been a significant decrease in right lower lung zone opacity consistent with decreased pleural fluid. Most of the right hemidiaphragm is now visible. There is patchy opacity in the right mid to lower lung zone consistent with atelectasis or infection. There is milder opacity in the right upper lobe extending from the right hilum, also consistent with atelectasis or infection. Possible minimal lateral loculated pneumothorax on the right. No atypical pneumothorax. Linear opacities in the left mid lung consistent with atelectasis. Left lung otherwise clear. No left pleural effusion. IMPRESSION: 1. Significant reduction in right pleural fluid following placement of the right-sided, inferior me thorax, pigtail chest tube. 2. Suspect a minimal, loculated lateral right pneumothorax. No apical pneumothorax. 3. Residual opacity in  the right mid to lower lung, and to a lesser degree right upper lung, may reflect atelectasis, pneumonia or a combination. Electronically Signed   By: Lajean Manes M.D.   On: 04/10/2019 15:03   Dg Chest 2 View  Result Date: 04/10/2019 CLINICAL DATA:  No pneumonia, assess progression EXAM: CHEST - 2 VIEW COMPARISON:  04/06/2019 FINDINGS: Enlargement of cardiac silhouette with vascular congestion. Progressive opacity in the RIGHT hemithorax likely representing a combination of increased infiltrate, basilar atelectasis, and loculated pleural effusion. LEFT lung clear. No pneumothorax or acute osseous findings. IMPRESSION: Progressive opacity of the RIGHT hemithorax likely representing a combination of increased infiltrate, basilar atelectasis and increased loculated RIGHT pleural effusion. Enlargement of cardiac silhouette with pulmonary vascular congestion. Electronically Signed   By: Lavonia Dana M.D.   On: 04/10/2019 11:06   Ct Chest Wo Contrast  Result Date: 04/11/2019 CLINICAL DATA:  Chest pain and shortness-of-breath, suspect pleural effusion. Follow-up chest tube placement. EXAM: CT CHEST WITHOUT CONTRAST TECHNIQUE: Multidetector CT imaging of the chest was performed following the standard protocol without IV contrast. COMPARISON:  04/06/2019 FINDINGS: Cardiovascular: Mild stable cardiomegaly. Thoracic aorta  is normal in caliber. Remaining vascular structures are unremarkable. Mediastinum/Nodes: Subcentimeter superior mediastinal lymph node unchanged. Findings suggesting subcarinal adenopathy measuring 1.7 cm by short axis unchanged. Subcentimeter right pericardiophrenic lymph node unchanged. Remaining mediastinal structures unremarkable on this noncontrast study. Lungs/Pleura: Lungs are adequately inflated and demonstrate evidence of patient's right posterior basilar pleural drainage catheter in adequate position over the posterior right pleural space. There is a small persistent right-sided  pneumothorax over the anterolateral mid to lower thorax. There is a persistent small right pleural effusion without significant change. Persistent masslike consolidation over the lateral right base which may represent atelectasis, although mass is possible. Patchy atelectasis over the right mid to upper lung and posterior mid to lower left lung. Airways are unremarkable. Upper Abdomen: Calcified plaque over the abdominal aorta. Few subcentimeter lymph nodes over the gastrohepatic ligament unchanged. No acute findings. Musculoskeletal: Minimal degenerative change of the spine. IMPRESSION: 1. Persistent small right-sided pleural effusion without significant change. Right posterior pleural drainage catheter in adequate position. Small right pneumothorax. Patchy bilateral atelectasis. 2. Masslike consolidation over the lateral right base which may be due to atelectasi or infection s, although underlying mass is possible. Suggestion of subcarinal adenopathy unchanged. Recommend follow-up CT 3-4 weeks to resolution to exclude underlying malignancy. 3.  Aortic Atherosclerosis (ICD10-I70.0).  Stable cardiomegaly. Electronically Signed   By: Marin Olp M.D.   On: 04/11/2019 13:42   Ct Angio Chest Pe W And/or Wo Contrast  Result Date: 04/06/2019 CLINICAL DATA:  Shortness of breath and chest pain for a month, coughing up brown green mucus, dyspnea with exertion, elevated D-dimer, intermediate clinical probability for pulmonary embolism EXAM: CT ANGIOGRAPHY CHEST WITH CONTRAST TECHNIQUE: Multidetector CT imaging of the chest was performed using the standard protocol during bolus administration of intravenous contrast. Multiplanar CT image reconstructions and MIPs were obtained to evaluate the vascular anatomy. CONTRAST:  171mL OMNIPAQUE IOHEXOL 350 MG/ML SOLN IV COMPARISON:  08/23/2012 FINDINGS: Cardiovascular: Atherosclerotic calcifications of aorta and proximal great vessels. Aorta normal caliber without aneurysm or  dissection. Pulmonary arteries adequately opacified and grossly patent. No evidence of pulmonary embolism. Cardiac chambers appear enlarged. No pericardial effusion. Mediastinum/Nodes: Base of cervical region normal appearance. Esophagus unremarkable. 9 mm short axis prevascular node image 17. No thoracic adenopathy. Lungs/Pleura: Subsegmental atelectasis at lung bases. Scattered peripheral subsegmental atelectasis in remaining RIGHT lung. Consolidation at superior RIGHT anterior basilar segments of RIGHT lower lobe. Small RIGHT pleural effusion. No mass or pneumothorax. Upper Abdomen: Visualized upper abdomen unremarkable Musculoskeletal: Unremarkable Review of the MIP images confirms the above findings. IMPRESSION: No evidence of pulmonary embolism. Consolidation of superior and anterior basilar segments of RIGHT lower lobe. Small RIGHT pleural effusion. Scattered atelectasis in RIGHT lung greatest at RIGHT base. Aortic Atherosclerosis (ICD10-I70.0). Electronically Signed   By: Lavonia Dana M.D.   On: 04/06/2019 15:40   Dg Chest Port 1 View  Result Date: 04/13/2019 CLINICAL DATA:  58 year old female with history of pneumonia status post chest tube removal. EXAM: PORTABLE CHEST 1 VIEW COMPARISON:  04/13/2019. FINDINGS: Previously noted small bore right-sided chest tube has been removed. Persistent small right pleural effusion. Areas of atelectasis and/or consolidation throughout the right lung base. Left lung is clear. No left pleural effusion. No definite pneumothorax. No evidence of pulmonary edema. Mild cardiomegaly. Upper mediastinal contours are within normal limits. IMPRESSION: 1. Removal of right-sided chest tube. No pneumothorax. 2. Small right pleural effusion with areas of atelectasis and/or consolidation in the right lung base. 3. Improving aeration in the lungs bilaterally,  as above. Electronically Signed   By: Vinnie Langton M.D.   On: 04/13/2019 16:30   Dg Chest Port 1 View  Result Date:  04/13/2019 CLINICAL DATA:  58 year old female with history of pneumothorax. EXAM: PORTABLE CHEST 1 VIEW COMPARISON:  Chest x-ray 04/12/2019. FINDINGS: Right-sided chest tube in position with tip projecting over the lower right hemithorax. No appreciable right-sided pneumothorax identified at this time. Patchy multifocal opacities throughout the right mid to lower lung and in the left mid lung, which may reflect areas of atelectasis and/or consolidation. Probable small right pleural effusion. No left pleural effusion. No evidence of pulmonary edema. Heart size is mildly enlarged. Upper mediastinal contours are within normal limits. IMPRESSION: 1. Support apparatus, as above. 2. No right-sided pneumothorax. 3. Persistent patchy multifocal areas of atelectasis and/or airspace consolidation, as above. 4. Probable small right pleural effusion. Electronically Signed   By: Vinnie Langton M.D.   On: 04/13/2019 08:42   Dg Chest Port 1 View  Result Date: 04/12/2019 CLINICAL DATA:  Patient with history of pneumothorax. EXAM: PORTABLE CHEST 1 VIEW COMPARISON:  Chest radiograph 04/12/2019 FINDINGS: Monitoring leads overlie the patient. Stable cardiomegaly. Right chest tube remains in position. Similar-appearing patchy consolidation right mid lower lung with linear opacities left mid lung. Possible small right pleural effusion. No definite pneumothorax. IMPRESSION: Similar-appearing patchy consolidative opacities within the right mid and lower lung. No definite pneumothorax. Right chest tube remains in place. Electronically Signed   By: Lovey Newcomer M.D.   On: 04/12/2019 16:03   Dg Chest Port 1 View  Result Date: 04/12/2019 CLINICAL DATA:  Follow-up pneumothorax EXAM: PORTABLE CHEST 1 VIEW COMPARISON:  04/11/2019 FINDINGS: Cardiac shadow remains enlarged. Pigtail catheter is again noted in the right base. No pneumothorax is seen. Improved aeration is noted in both lungs although some persistent right basilar opacity  remains. The left lung is predominantly clear with the exception of platelike atelectasis in the mid lung. No bony abnormality is seen. IMPRESSION: Overall improved aeration with persistent opacities as described worse in the right base. Electronically Signed   By: Inez Catalina M.D.   On: 04/12/2019 11:38   Dg Chest Port 1 View  Result Date: 04/06/2019 CLINICAL DATA:  Cough and shortness of breath EXAM: PORTABLE CHEST 1 VIEW COMPARISON:  March 28, 2017. FINDINGS: There is airspace consolidation in the right mid and lower lung zones with suspected superimposed pleural effusion in this area. There is atelectatic change in the left mid lung. The left lung is otherwise clear. Heart is borderline enlarged with pulmonary vascularity normal. No adenopathy. No bone lesions. IMPRESSION: Airspace opacity consistent with pneumonia in portions of the right mid and lower lung zones with fairly small pleural effusion. Mild atelectasis left mid lung. Mild cardiomegaly. No adenopathy evident. Electronically Signed   By: Lowella Grip III M.D.   On: 04/06/2019 12:24       LAB RESULTS: Basic Metabolic Panel: Recent Labs  Lab 04/13/19 0155 04/14/19 0520  NA 137 137  K 3.5 3.4*  CL 99 98  CO2 25 27  GLUCOSE 118* 153*  BUN 12 15  CREATININE 0.85 0.69  CALCIUM 9.2 9.4  MG 1.4* 1.9   Liver Function Tests: No results for input(s): AST, ALT, ALKPHOS, BILITOT, PROT, ALBUMIN in the last 168 hours. No results for input(s): LIPASE, AMYLASE in the last 168 hours. No results for input(s): AMMONIA in the last 168 hours. CBC: Recent Labs  Lab 04/10/19 1035  04/13/19 0155 04/14/19 0520  WBC  20.4*   < > 13.4* 10.4  NEUTROABS 15.6*  --   --   --   HGB 8.6*   < > 8.7* 8.3*  HCT 30.1*   < > 30.9* 29.7*  MCV 81.6   < > 83.1 83.0  PLT 539*   < > 579* 503*   < > = values in this interval not displayed.   Cardiac Enzymes: No results for input(s): CKTOTAL, CKMB, CKMBINDEX, TROPONINI in the last 168  hours. BNP: Invalid input(s): POCBNP CBG: No results for input(s): GLUCAP in the last 168 hours.    Disposition and Follow-up: Discharge Instructions    Diet - low sodium heart healthy   Complete by: As directed    Increase activity slowly   Complete by: As directed        DISPOSITION: [place]    Candee Furbish, MD Follow up on 05/10/2019.   Specialty: Pulmonary Disease Why: at 1115am.  check in at 11 please  Contact information: [place] [address] [phone]        [name], PA-C. Schedule an appointment as soon as possible for a visit in 2 week(s).   Specialty: Physician Assistant Contact information: [place] [phone]        Josue Hector, MD .   Specialty: Cardiology Contact information: [phone] [place] [address] [phone]            Time coordinating discharge:  35 minutes  Signed:   Estill Cotta M.D. [facility] 04/14/2019, 12:52 PM

## 2019-04-15 LAB — CULTURE, BLOOD (ROUTINE X 2)
Culture: NO GROWTH
Culture: NO GROWTH
Special Requests: ADEQUATE
Special Requests: ADEQUATE

## 2019-04-30 ENCOUNTER — Other Ambulatory Visit: Payer: Self-pay | Admitting: *Deleted

## 2019-04-30 ENCOUNTER — Telehealth: Payer: Self-pay | Admitting: Internal Medicine

## 2019-04-30 DIAGNOSIS — J181 Lobar pneumonia, unspecified organism: Secondary | ICD-10-CM

## 2019-04-30 NOTE — Telephone Encounter (Signed)
Kelly Butler Order: 30mg  #1 prefilled syringe Ordered date: 04/30/19  Expected date of arrival: 05/04/19 Ordered by: Parke Poisson, [place]  Specialty Pharmacy: [facility]

## 2019-05-02 ENCOUNTER — Other Ambulatory Visit: Payer: Self-pay | Admitting: *Deleted

## 2019-05-02 ENCOUNTER — Other Ambulatory Visit: Payer: Self-pay | Admitting: Obstetrics

## 2019-05-02 DIAGNOSIS — R918 Other nonspecific abnormal finding of lung field: Secondary | ICD-10-CM

## 2019-05-02 DIAGNOSIS — B3731 Acute candidiasis of vulva and vagina: Secondary | ICD-10-CM

## 2019-05-02 DIAGNOSIS — B373 Candidiasis of vulva and vagina: Secondary | ICD-10-CM

## 2019-05-04 ENCOUNTER — Encounter: Payer: Self-pay | Admitting: Internal Medicine

## 2019-05-04 ENCOUNTER — Other Ambulatory Visit: Payer: Self-pay

## 2019-05-04 ENCOUNTER — Other Ambulatory Visit: Payer: Medicaid Other

## 2019-05-04 ENCOUNTER — Ambulatory Visit (INDEPENDENT_AMBULATORY_CARE_PROVIDER_SITE_OTHER): Payer: Medicaid Other | Admitting: Internal Medicine

## 2019-05-04 DIAGNOSIS — J4551 Severe persistent asthma with (acute) exacerbation: Secondary | ICD-10-CM | POA: Diagnosis not present

## 2019-05-04 DIAGNOSIS — J181 Lobar pneumonia, unspecified organism: Secondary | ICD-10-CM

## 2019-05-04 DIAGNOSIS — G4733 Obstructive sleep apnea (adult) (pediatric): Secondary | ICD-10-CM | POA: Diagnosis not present

## 2019-05-04 DIAGNOSIS — J9601 Acute respiratory failure with hypoxia: Secondary | ICD-10-CM

## 2019-05-04 DIAGNOSIS — E662 Morbid (severe) obesity with alveolar hypoventilation: Secondary | ICD-10-CM | POA: Diagnosis not present

## 2019-05-04 MED ORDER — ALBUTEROL SULFATE HFA 108 (90 BASE) MCG/ACT IN AERS: INHALATION_SPRAY | RESPIRATORY_TRACT | 12 refills | Status: DC

## 2019-05-04 NOTE — Assessment & Plan Note (Signed)
Will reassess O2 need after longer time for recovery from recent hosp. Body habitus favors Obesity Hypoventilation.

## 2019-05-04 NOTE — Assessment & Plan Note (Signed)
Weighs 16 lbs less than at time of original study. Plan- watch need to update sleep study

## 2019-05-04 NOTE — Assessment & Plan Note (Signed)
Exacerbation associated with lobar pneumonia.  Plan- continue Fasenra. Ok to begin skipping nebulizer doses when not needed. Refill rescue inhaler

## 2019-05-04 NOTE — Patient Instructions (Signed)
ProAir albuterol refill script sent  We will continue [name] and your other meds  Keep the appointment for your CT scan November 2  Please call if we can help

## 2019-05-04 NOTE — Assessment & Plan Note (Signed)
She has lost some weight- likely mostly water.  Plan- encourage ongoing weight loss, walking for exercise
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
## 2019-05-04 NOTE — Assessment & Plan Note (Signed)
CT scan 04/06/2019 dense R lung masslike consolidation and mediastinal adenopathy. Recommended f/u CT after Rx- now pending 111/2.

## 2019-05-04 NOTE — Telephone Encounter (Signed)
Fasenra Shipment Received:  30mg  #1 prefilled syringe Medication arrival date: 05/04/19  Lot #: [id number]  Exp date: 05/2020 Received by: Parke Poisson, CMA

## 2019-05-07 ENCOUNTER — Inpatient Hospital Stay: Admit: 2019-05-07 | Payer: Medicaid Other

## 2019-05-07 ENCOUNTER — Other Ambulatory Visit: Payer: Medicaid Other

## 2019-05-09 ENCOUNTER — Telehealth: Payer: Self-pay | Admitting: *Deleted

## 2019-05-09 ENCOUNTER — Ambulatory Visit: Payer: Medicaid Other

## 2019-05-09 ENCOUNTER — Other Ambulatory Visit: Payer: Self-pay | Admitting: Obstetrics

## 2019-05-09 DIAGNOSIS — B373 Candidiasis of vulva and vagina: Secondary | ICD-10-CM

## 2019-05-09 DIAGNOSIS — B3731 Acute candidiasis of vulva and vagina: Secondary | ICD-10-CM

## 2019-05-09 MED ORDER — FLUCONAZOLE 150 MG PO TABS: 150.0000 mg | ORAL_TABLET | Freq: Once | ORAL | 0 refills | Status: DC

## 2019-05-09 NOTE — Telephone Encounter (Signed)
Refill request from pharmacy for Fluconazole 150mg . [facility] [address]  Please send refill if approved.

## 2019-05-10 ENCOUNTER — Inpatient Hospital Stay: Payer: Medicaid Other | Admitting: Internal Medicine

## 2019-05-10 NOTE — Progress Notes (Signed)
05/10/19  S: Seen in f/u after hospitalization for R sided pneumonia with parapneumonic effusion.  CT Chest also showing unusual configuration of infiltrate that could be c/w underlying mass.  Comorbidities include allergic asthma on facenra, metabolic syndrome, restrictive lung disease, VCD.  Cultures of pleural fluid and sputum were negative.  Cytology of pleural fluid benign.  Presents today for followup.  Improved but not back to baseline with breathing and energy levels. Still MMRC 2.  Denies cough, fever, chills, hemoptysis.  ROS + symptoms in bold  Fevers, chills, weight loss Nausea, vomiting, diarrhea Shortness of breath, wheezing, cough Chest pain, palpitations, lower ext edema   O: Today's Vitals   05/11/19 0856 05/11/19 0858  BP:  120/70  Pulse:  84  Temp: (!) 97.3 F (36.3 C)   TempSrc: Temporal   SpO2:  97%  Weight: 197 lb 6.4 oz (89.5 kg)   Height: 5' (1.524 m)    Body mass index is 38.55 kg/m.  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  Path on effusion neg/reactive, lymphocyte predominant. # Allergic asthma on fasenra not in flare # CVD # Metabolic syndrome  P: - CT chest already scheduled for Monday, reviewed CXR with her which is encouraging but her plateau in symptoms and configuration of the infiltrates warrants close f/u - Continue fasenra and anoro - Virtual visit in a couple weeks to review results of CT and discuss symptoms  Erskine Emery MD

## 2019-05-11 ENCOUNTER — Encounter: Payer: Self-pay | Admitting: Internal Medicine

## 2019-05-11 ENCOUNTER — Ambulatory Visit (INDEPENDENT_AMBULATORY_CARE_PROVIDER_SITE_OTHER): Payer: Medicaid Other

## 2019-05-11 ENCOUNTER — Ambulatory Visit (INDEPENDENT_AMBULATORY_CARE_PROVIDER_SITE_OTHER): Payer: Medicaid Other | Admitting: Internal Medicine

## 2019-05-11 ENCOUNTER — Other Ambulatory Visit: Payer: Self-pay

## 2019-05-11 VITALS — BP 120/70 | HR 84 | Temp 97.3°F | Ht 60.0 in | Wt 197.4 lb

## 2019-05-11 DIAGNOSIS — R9389 Abnormal findings on diagnostic imaging of other specified body structures: Secondary | ICD-10-CM | POA: Diagnosis not present

## 2019-05-11 DIAGNOSIS — J4551 Severe persistent asthma with (acute) exacerbation: Secondary | ICD-10-CM

## 2019-05-11 DIAGNOSIS — R911 Solitary pulmonary nodule: Secondary | ICD-10-CM

## 2019-05-11 MED ORDER — BENRALIZUMAB 30 MG/ML ~~LOC~~ SOSY
30.0000 mg | PREFILLED_SYRINGE | Freq: Once | SUBCUTANEOUS | Status: AC
  Administered 2019-05-11: 30 mg via SUBCUTANEOUS

## 2019-05-11 NOTE — Progress Notes (Signed)
All questions were answered by the patient before medication was administered. Have you been hospitalized in the last 10 days? No Do you have a fever? No Do you have a cough? No Do you have a headache or sore throat? No  

## 2019-05-11 NOTE — Patient Instructions (Addendum)
-  Chest X-ray does look better -CT Chest in 2 weeks - Slowly increase activity as tolerated -3 weeks with Dr. [name], myself or midlevel to discuss CT findings. If CY not available, Virtual with ME or APP

## 2019-05-14 ENCOUNTER — Other Ambulatory Visit: Payer: Medicaid Other

## 2019-05-15 ENCOUNTER — Ambulatory Visit
Admission: RE | Admit: 2019-05-15 | Discharge: 2019-05-15 | Disposition: A | Discharge status: Home / Self Care | Payer: Medicaid Other | Source: Ambulatory Visit | Attending: Internal Medicine | Admitting: Internal Medicine

## 2019-05-15 DIAGNOSIS — J181 Lobar pneumonia, unspecified organism: Secondary | ICD-10-CM

## 2019-05-18 ENCOUNTER — Other Ambulatory Visit: Payer: Self-pay | Admitting: Internal Medicine

## 2019-06-07 ENCOUNTER — Ambulatory Visit: Payer: Medicaid Other | Admitting: Internal Medicine

## 2019-07-02 ENCOUNTER — Telehealth: Payer: Self-pay | Admitting: Internal Medicine

## 2019-07-02 NOTE — Telephone Encounter (Signed)
[name] Order: 30mg  #1 prefilled syringe Ordered date: 07/02/19 Expected date of arrival: 07/05/19 Ordered by: [place]: [facility]

## 2019-07-05 NOTE — Telephone Encounter (Signed)
Fasenra Shipment Received:  30mg  #1 prefilled syringe Medication arrival date: 07/05/19 Lot #: [id number] Exp date: 04/04/2020 Received by: Elliot Dally

## 2019-07-09 ENCOUNTER — Ambulatory Visit: Payer: Medicaid Other

## 2019-07-12 ENCOUNTER — Ambulatory Visit: Payer: Medicaid Other

## 2019-07-13 ENCOUNTER — Ambulatory Visit: Payer: Medicaid Other

## 2019-07-16 ENCOUNTER — Ambulatory Visit: Payer: Medicaid Other

## 2019-07-27 ENCOUNTER — Ambulatory Visit: Payer: Medicaid Other

## 2019-07-31 ENCOUNTER — Ambulatory Visit (INDEPENDENT_AMBULATORY_CARE_PROVIDER_SITE_OTHER): Payer: Medicaid Other

## 2019-07-31 ENCOUNTER — Other Ambulatory Visit: Payer: Self-pay

## 2019-07-31 DIAGNOSIS — J4551 Severe persistent asthma with (acute) exacerbation: Secondary | ICD-10-CM

## 2019-07-31 MED ORDER — BENRALIZUMAB 30 MG/ML ~~LOC~~ SOSY
30.0000 mg | PREFILLED_SYRINGE | Freq: Once | SUBCUTANEOUS | Status: AC
  Administered 2019-07-31: 30 mg via SUBCUTANEOUS

## 2019-07-31 NOTE — Progress Notes (Signed)
All questions were answered by the patient before medication was administered. Have you been hospitalized in the last 10 days? No Do you have a fever? No Do you have a cough? No Do you have a headache or sore throat? No  

## 2019-08-07 ENCOUNTER — Other Ambulatory Visit: Payer: Self-pay | Admitting: Cardiovascular Disease

## 2019-08-07 ENCOUNTER — Ambulatory Visit: Payer: Medicaid Other | Admitting: Internal Medicine

## 2019-08-11 ENCOUNTER — Other Ambulatory Visit: Payer: Self-pay | Admitting: Cardiovascular Disease

## 2019-09-04 ENCOUNTER — Ambulatory Visit: Payer: Medicaid Other

## 2019-09-04 ENCOUNTER — Telehealth: Payer: Self-pay | Admitting: Internal Medicine

## 2019-09-04 NOTE — Telephone Encounter (Signed)
[name] Order: 30mg  #1 prefilled syringe Ordered date: 09/04/2019 Expected date of arrival: 09/06/2019 Ordered by: Desmond Dike, [place]  Specialty Pharmacy: [facility]

## 2019-09-06 NOTE — Telephone Encounter (Signed)
Fasenra Shipment Received:  30mg  #1 prefilled syringe Medication arrival date: 09/06/2019 Lot #: [id number] Exp date: 09/2020 Received by: Desmond Dike, [place]

## 2019-09-14 ENCOUNTER — Ambulatory Visit: Payer: Medicaid Other | Admitting: Internal Medicine

## 2019-09-16 ENCOUNTER — Other Ambulatory Visit: Payer: Self-pay | Admitting: Obstetrics

## 2019-09-16 DIAGNOSIS — B3731 Acute candidiasis of vulva and vagina: Secondary | ICD-10-CM

## 2019-09-16 DIAGNOSIS — B373 Candidiasis of vulva and vagina: Secondary | ICD-10-CM

## 2019-10-05 ENCOUNTER — Other Ambulatory Visit: Payer: Self-pay | Admitting: Internal Medicine

## 2019-10-09 NOTE — Telephone Encounter (Signed)
ambien refill e-sent 
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
## 2019-10-16 ENCOUNTER — Encounter: Payer: Self-pay | Admitting: Internal Medicine

## 2019-10-16 ENCOUNTER — Telehealth: Payer: Self-pay | Admitting: Internal Medicine

## 2019-10-16 ENCOUNTER — Ambulatory Visit (INDEPENDENT_AMBULATORY_CARE_PROVIDER_SITE_OTHER): Payer: Medicaid Other | Admitting: Internal Medicine

## 2019-10-16 ENCOUNTER — Other Ambulatory Visit: Payer: Self-pay

## 2019-10-16 ENCOUNTER — Ambulatory Visit: Payer: Medicaid Other

## 2019-10-16 VITALS — BP 128/64 | HR 91 | Temp 97.0°F | Ht 60.0 in | Wt 195.0 lb

## 2019-10-16 DIAGNOSIS — J4551 Severe persistent asthma with (acute) exacerbation: Secondary | ICD-10-CM

## 2019-10-16 DIAGNOSIS — J4540 Moderate persistent asthma, uncomplicated: Secondary | ICD-10-CM | POA: Diagnosis not present

## 2019-10-16 DIAGNOSIS — J9611 Chronic respiratory failure with hypoxia: Secondary | ICD-10-CM | POA: Diagnosis not present

## 2019-10-16 DIAGNOSIS — J9601 Acute respiratory failure with hypoxia: Secondary | ICD-10-CM | POA: Diagnosis not present

## 2019-10-16 DIAGNOSIS — E662 Morbid (severe) obesity with alveolar hypoventilation: Secondary | ICD-10-CM

## 2019-10-16 LAB — CBC WITH DIFFERENTIAL/PLATELET
Basophils Absolute: 0 10*3/uL (ref 0.0–0.1)
Basophils Relative: 0.2 % (ref 0.0–3.0)
Eosinophils Absolute: 0 10*3/uL (ref 0.0–0.7)
Eosinophils Relative: 0 % (ref 0.0–5.0)
HCT: 35.5 % — ABNORMAL LOW (ref 36.0–46.0)
Hemoglobin: 11.7 g/dL — ABNORMAL LOW (ref 12.0–15.0)
Lymphocytes Relative: 32.1 % (ref 12.0–46.0)
Lymphs Abs: 4.2 10*3/uL — ABNORMAL HIGH (ref 0.7–4.0)
MCHC: 32.9 g/dL (ref 30.0–36.0)
MCV: 81.4 fl (ref 78.0–100.0)
Monocytes Absolute: 0.4 10*3/uL (ref 0.1–1.0)
Monocytes Relative: 3.1 % (ref 3.0–12.0)
Neutro Abs: 8.5 10*3/uL — ABNORMAL HIGH (ref 1.4–7.7)
Neutrophils Relative %: 64.6 % (ref 43.0–77.0)
Platelets: 340 10*3/uL (ref 150.0–400.0)
RBC: 4.36 Mil/uL (ref 3.87–5.11)
RDW: 15.8 % — ABNORMAL HIGH (ref 11.5–15.5)
WBC: 13.2 10*3/uL — ABNORMAL HIGH (ref 4.0–10.5)

## 2019-10-16 LAB — BASIC METABOLIC PANEL
BUN: 12 mg/dL (ref 6–23)
CO2: 27 mEq/L (ref 19–32)
Calcium: 9.6 mg/dL (ref 8.4–10.5)
Chloride: 105 mEq/L (ref 96–112)
Creatinine, Ser: 0.59 mg/dL (ref 0.40–1.20)
GFR: 127.23 mL/min (ref >60.00)
Glucose, Bld: 128 mg/dL — ABNORMAL HIGH (ref 70–99)
Potassium: 3.5 mEq/L (ref 3.5–5.1)
Sodium: 140 mEq/L (ref 135–145)

## 2019-10-16 LAB — BRAIN NATRIURETIC PEPTIDE: Pro B Natriuretic peptide (BNP): 26 pg/mL (ref 0.0–100.0)

## 2019-10-16 MED ORDER — BENRALIZUMAB 30 MG/ML ~~LOC~~ SOSY
30.0000 mg | PREFILLED_SYRINGE | Freq: Once | SUBCUTANEOUS | Status: AC
  Administered 2019-10-16: 10:00:00 30 mg via SUBCUTANEOUS

## 2019-10-16 NOTE — Progress Notes (Signed)
Patient identification verified. Results of recent lab work reviewed. Message from Dr. [name] provided.  Labs- Very mild anemia. White blood cell count remains a little elevated. Infections and steroids can do this. Blood glucose was mildly elevated, but not fasting. BNP test was normal, not showing fluid overload.  Patient verbalized understanding of results.

## 2019-10-16 NOTE — Telephone Encounter (Signed)
Pt was given her injection at her appointment today with CY.

## 2019-10-16 NOTE — Patient Instructions (Signed)
Order- CXR   Dx Chronic hypoxic respiratory failure  Order lab- CBC w diff, BMET, BNP      Dx CRFH, Diastolic heart failure  Script sent for albuterol rescue inhaler    Inhale 2 puffs every 6 hours if needed  We will send you to get your [name] shot today  Please call as needed
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
## 2019-10-16 NOTE — Progress Notes (Signed)
All questions were answered by the patient before medication was administered. Have you been hospitalized in the last 10 days? No Do you have a fever? No Do you have a cough? No Do you have a headache or sore throat? No  

## 2019-10-17 ENCOUNTER — Telehealth: Payer: Self-pay | Admitting: Internal Medicine

## 2019-10-17 NOTE — Telephone Encounter (Signed)
Please disregard message

## 2019-10-17 NOTE — Telephone Encounter (Signed)
Pt is currently receiving Fasenra 30mg q8w at our office. We are starting to transition our patients to self administer at home. Please advise if you believe pt would be a good candidate for this. Thanks.  

## 2019-10-19 ENCOUNTER — Ambulatory Visit: Payer: Medicaid Other | Admitting: Cardiovascular Disease

## 2019-11-01 ENCOUNTER — Ambulatory Visit: Payer: Medicaid Other | Admitting: Obstetrics

## 2019-11-16 ENCOUNTER — Other Ambulatory Visit: Payer: Self-pay | Admitting: Internal Medicine

## 2019-11-16 MED ORDER — BEVESPI AEROSPHERE 9-4.8 MCG/ACT IN AERO: 2.0000 | INHALATION_SPRAY | Freq: Two times a day (BID) | RESPIRATORY_TRACT | 6 refills | Status: DC

## 2019-11-17 ENCOUNTER — Encounter: Payer: Self-pay | Admitting: Internal Medicine

## 2019-11-17 DIAGNOSIS — J9611 Chronic respiratory failure with hypoxia: Secondary | ICD-10-CM | POA: Insufficient documentation

## 2019-11-17 NOTE — Assessment & Plan Note (Signed)
Kelly Butler may be helping. Overlap symptoms with CHF and probable OHS. Plan- refill albuterol hfa, Fasenra, CXR, labs for CBC w diff, BMET, BNP

## 2019-11-17 NOTE — Assessment & Plan Note (Signed)
Continues to benefit from O2 2l sleep and prn

## 2019-11-17 NOTE — Assessment & Plan Note (Signed)
Continuing effort at education and motivation to try for long term weight loss.  Plan- consider Bariatric or Healthy [facility]ness referral
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
## 2019-11-19 ENCOUNTER — Ambulatory Visit: Payer: Medicaid Other | Admitting: Cardiology

## 2019-11-20 ENCOUNTER — Ambulatory Visit: Payer: Medicaid Other | Admitting: Obstetrics

## 2019-11-26 ENCOUNTER — Ambulatory Visit: Payer: Medicaid Other | Admitting: Obstetrics

## 2019-12-18 ENCOUNTER — Other Ambulatory Visit: Payer: Self-pay | Admitting: *Deleted

## 2019-12-18 ENCOUNTER — Telehealth: Payer: Self-pay | Admitting: Internal Medicine

## 2019-12-18 MED ORDER — DOXYCYCLINE HYCLATE 100 MG PO TABS: ORAL_TABLET | ORAL | 0 refills | Status: DC

## 2019-12-18 MED ORDER — PREDNISONE 10 MG PO TABS: ORAL_TABLET | ORAL | 0 refills | Status: DC

## 2019-12-18 NOTE — Telephone Encounter (Signed)
Spoke with pt. States that she is not feeling well. Reports increased shortness of breath, coughing and wheezing. Cough is non productive at this time. Denies fever/chills, congestion/runny nose, sore throat, severe headache, joint pain, unexplained muscle aches, loss of taste or smell, rash, N/V/D, abdominal pain, redness around/in the eye, increased weakness or unexplained bruising or bleeding.  Symptoms started late last night. Pt would like CY's recommendations.  CY - please advise. Thanks.

## 2019-12-18 NOTE — Telephone Encounter (Signed)
Spoke with patient.  Gave Dr. Janee Morn recommendations.  Prescriptions to be sent to patient's pharmacy.  Patient verbalized understanding regarding Prednisone and Doxycycline.

## 2019-12-18 NOTE — Telephone Encounter (Signed)
Called [facility] to set up Patient's Fasenra injection.  I was informed a new PA is needed to continue [place]. PA notification will be sent via fax from [facility] to be followed up.  Message routed to injection and Pharm team to follow up

## 2019-12-18 NOTE — Telephone Encounter (Signed)
Offer prednisone 10 mg, # 20, 4 X 2 DAYS, 3 X 2 DAYS, 2 X 2 DAYS, 1 X 2 DAYS                             And doxycycline 100 mg, # 8,  2 today then one daily

## 2019-12-19 NOTE — Telephone Encounter (Signed)
Received notification from [place] regarding a prior authorization for [facility]. Authorization has been APPROVED from 12/19/19 to 12/18/20.   Authorization # [id number]  9:12 AM Beatriz Chancellor, CPhT

## 2019-12-20 NOTE — Telephone Encounter (Signed)
Left a VM x1 for patient requesting that she return our call at her convience to schedule her injection and advised that her [name] is expected to arrive by 12/25/2019.

## 2019-12-20 NOTE — Telephone Encounter (Signed)
[name] Order: 30mg  #1 prefilled syringe Ordered date: 12/20/2019 Expected date of arrival: 12/25/2019 Ordered by: Desmond Dike, [place]  Specialty Pharmacy: [facility]

## 2019-12-21 NOTE — Telephone Encounter (Signed)
LMTCB x2 for pt 

## 2019-12-25 NOTE — Telephone Encounter (Signed)
Fasenra Shipment Received:  30mg  #1 prefilled syringe Medication arrival date: 12/25/2019 Lot #: [id number] Exp date: 03/2021 Received by: Desmond Dike, [place] with pt. She has been scheduled for 12/28/2019 at 1430. Nothing further was needed.

## 2019-12-27 ENCOUNTER — Other Ambulatory Visit: Payer: Self-pay | Admitting: Internal Medicine
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
## 2019-12-28 ENCOUNTER — Telehealth: Payer: Self-pay | Admitting: *Deleted

## 2019-12-28 ENCOUNTER — Telehealth (INDEPENDENT_AMBULATORY_CARE_PROVIDER_SITE_OTHER): Payer: Medicaid Other | Admitting: Cardiovascular Disease

## 2019-12-28 ENCOUNTER — Telehealth: Payer: Self-pay | Admitting: Internal Medicine

## 2019-12-28 ENCOUNTER — Ambulatory Visit: Payer: Self-pay

## 2019-12-28 VITALS — Ht 60.0 in | Wt 170.0 lb

## 2019-12-28 DIAGNOSIS — I420 Dilated cardiomyopathy: Secondary | ICD-10-CM

## 2019-12-28 MED ORDER — SPIRONOLACTONE 25 MG PO TABS: 25.0000 mg | ORAL_TABLET | Freq: Every day | ORAL | 3 refills | Status: DC

## 2019-12-28 MED ORDER — TORSEMIDE 20 MG PO TABS: 20.0000 mg | ORAL_TABLET | Freq: Two times a day (BID) | ORAL | 3 refills | Status: DC

## 2019-12-28 MED ORDER — LOSARTAN POTASSIUM 100 MG PO TABS: 100.0000 mg | ORAL_TABLET | Freq: Every day | ORAL | 3 refills | Status: DC

## 2019-12-28 MED ORDER — METOPROLOL TARTRATE 25 MG PO TABS: 25.0000 mg | ORAL_TABLET | Freq: Two times a day (BID) | ORAL | 3 refills | Status: DC

## 2019-12-28 NOTE — Telephone Encounter (Signed)
  Patient Consent for Virtual Visit         Kelly Butler has provided verbal consent on 12/28/2019 for a virtual visit (video or telephone).   CONSENT FOR VIRTUAL VISIT FOR:  Kelly Butler  By participating in this virtual visit I agree to the following:  I hereby voluntarily request, consent and authorize [facility] and its employed or contracted physicians, physician assistants, nurse practitioners or other licensed health care professionals (the Practitioner), to provide me with telemedicine health care services (the "Services") as deemed necessary by the treating Practitioner. I acknowledge and consent to receive the Services by the Practitioner via telemedicine. I understand that the telemedicine visit will involve communicating with the Practitioner through live audiovisual communication technology and the disclosure of certain medical information by electronic transmission. I acknowledge that I have been given the opportunity to request an in-person assessment or other available alternative prior to the telemedicine visit and am voluntarily participating in the telemedicine visit.  I understand that I have the right to withhold or withdraw my consent to the use of telemedicine in the course of my care at any time, without affecting my right to future care or treatment, and that the Practitioner or I may terminate the telemedicine visit at any time. I understand that I have the right to inspect all information obtained and/or recorded in the course of the telemedicine visit and may receive copies of available information for a reasonable fee.  I understand that some of the potential risks of receiving the Services via telemedicine include:  [name] Delay or interruption in medical evaluation due to technological equipment failure or disruption; . Information transmitted may not be sufficient (e.g. poor resolution of images) to allow for appropriate medical decision making by the Practitioner;  and/or  . In rare instances, security protocols could fail, causing a breach of personal health information.  Furthermore, I acknowledge that it is my responsibility to provide information about my medical history, conditions and care that is complete and accurate to the best of my ability. I acknowledge that Practitioner's advice, recommendations, and/or decision may be based on factors not within their control, such as incomplete or inaccurate data provided by me or distortions of diagnostic images or specimens that may result from electronic transmissions. I understand that the practice of medicine is not an exact science and that Practitioner makes no warranties or guarantees regarding treatment outcomes. I acknowledge that a copy of this consent can be made available to me via my patient portal ([facility]), or I can request a printed copy by calling the office of [facility].    I understand that my insurance will be billed for this visit.   I have read or had this consent read to me. . I understand the contents of this consent, which adequately explains the benefits and risks of the Services being provided via telemedicine.  . I have been provided ample opportunity to ask questions regarding this consent and the Services and have had my questions answered to my satisfaction. . I give my informed consent for the services to be provided through the use of telemedicine in my medical care

## 2019-12-28 NOTE — Patient Instructions (Signed)
Medication Instructions:  Your physician recommends that you continue on your current medications as directed. Please refer to the Current Medication list given to you today.  *If you need a refill on your cardiac medications before your next appointment, please call your pharmacy*   Lab Work: none If you have labs (blood work) drawn today and your tests are completely normal, you will receive your results only by: [name] MyChart Message (if you have MyChart) OR . A paper copy in the mail If you have any lab test that is abnormal or we need to change your treatment, we will call you to review the results.   Testing/Procedures: Your physician has requested that you have an echocardiogram. Echocardiography is a painless test that uses sound waves to create images of your heart. It provides your doctor with information about the size and shape of your heart and how well your heart's chambers and valves are working. This procedure takes approximately one hour. There are no restrictions for this procedure.  To be done in June 2022     Follow-Up: At [facility], you and your health needs are our priority.  As part of our continuing mission to provide you with exceptional heart care, we have created designated Provider Care Teams.  These Care Teams include your primary Cardiologist (physician) and Advanced Practice Providers (APPs -  Physician Assistants and Nurse Practitioners) who all work together to provide you with the care you need, when you need it.  We recommend signing up for the patient portal called "MyChart".  Sign up information is provided on this After Visit Summary.  MyChart is used to connect with patients for Virtual Visits (Telemedicine).  Patients are able to view lab/test results, encounter notes, upcoming appointments, etc.  Non-urgent messages can be sent to your provider as well.   To learn more about what you can do with MyChart, go to [email or web address].    Your next  appointment:   1 year(s)  The format for your next appointment:   In Person  Provider:   You may see Jenkins Rouge, MD or one of the following Advanced Practice Providers on your designated Care Team:    Truitt Merle, NP  Cecilie Kicks, NP  Kathyrn Drown, NP    Other Instructions

## 2019-12-28 NOTE — Telephone Encounter (Signed)
Called and spoke with Patient.  Patient requested to change her Fasenra injection to 12/31/19. Injection appointment changed to 12/31/19 at 1115.  Nothing further at this time.

## 2019-12-28 NOTE — Addendum Note (Signed)
Addended by: Thompson Grayer on: 12/28/2019 09:45 AM   Modules accepted: Orders

## 2019-12-31 ENCOUNTER — Ambulatory Visit: Payer: Medicaid Other

## 2020-01-03 DIAGNOSIS — Z96651 Presence of right artificial knee joint: Secondary | ICD-10-CM | POA: Diagnosis not present

## 2020-01-03 DIAGNOSIS — Z419 Encounter for procedure for purposes other than remedying health state, unspecified: Secondary | ICD-10-CM | POA: Diagnosis not present

## 2020-01-03 DIAGNOSIS — I5032 Chronic diastolic (congestive) heart failure: Secondary | ICD-10-CM | POA: Diagnosis not present

## 2020-01-04 ENCOUNTER — Telehealth: Payer: Self-pay | Admitting: *Deleted

## 2020-01-04 ENCOUNTER — Other Ambulatory Visit: Payer: Self-pay | Admitting: Internal Medicine

## 2020-01-04 NOTE — Telephone Encounter (Signed)
   Primary Cardiologist: Jenkins Rouge, MD  Chart reviewed as part of pre-operative protocol coverage. Given past medical history and time since last visit, based on ACC/AHA guidelines, ALMINA SCHUL would be at acceptable risk for the planned procedure without further cardiovascular testing.   I will route this recommendation to the requesting party via Epic fax function and remove from pre-op pool.  Please call with questions.  Jossie Ng. Gentry Pilson NP-C    01/04/2020, 1:39 PM [facility] [place] [address] Office [phone] Fax [phone]

## 2020-01-04 NOTE — Telephone Encounter (Signed)
   [facility] Pre-operative Risk Assessment    HEARTCARE STAFF: - Please ensure there is not already an duplicate clearance open for this procedure. - Under Visit Info/Reason for Call, type in Other and utilize the format Clearance MM/DD/YY or Clearance TBD. Do not use dashes or single digits. - If request is for dental extraction, please clarify the # of teeth to be extracted.  Request for surgical clearance:  1. What type of surgery is being performed? RIGHT TKR   2. When is this surgery scheduled? TBD   3. What type of clearance is required (medical clearance vs. Pharmacy clearance to hold med vs. Both)? MEDICAL  4. Are there any medications that need to be held prior to surgery and how long? NONE LISTED    5. Practice name and name of physician performing surgery? [facility]; [place], [facility]   6. What is the office phone number? [phone]   7.   What is the office fax number? [phone]  8.   Anesthesia type (None, local, MAC, general) ? LEFT MESSAGE TO CALL BACK TO PRE OP CALL BACK TEAM WITH TYPE OF ANESTHESIA    Julaine Hua 01/04/2020, 12:29 PM  _________________________________________________________________   (provider comments below)

## 2020-01-04 NOTE — Telephone Encounter (Signed)
ADDENDUM: Dr. Ruel Favors office called back stating anesthesia will be CHOICE.   Surgeon is going to be Dr. Lara Mulch. I will update the pre op team for today.

## 2020-01-05 ENCOUNTER — Other Ambulatory Visit: Payer: Self-pay | Admitting: Internal Medicine

## 2020-01-08 NOTE — Telephone Encounter (Signed)
Prednisone taper sent to [facility]. Be sure to keep upcoming appointment

## 2020-01-08 NOTE — Telephone Encounter (Signed)
Please route to Dr. [name] who can address this when he is back in office.Wyn Quaker, FNP

## 2020-01-08 NOTE — Telephone Encounter (Signed)
Pt is requesting refill on predisone has f/u 01/18/20

## 2020-01-14 DIAGNOSIS — F332 Major depressive disorder, recurrent severe without psychotic features: Secondary | ICD-10-CM | POA: Diagnosis not present

## 2020-01-15 ENCOUNTER — Telehealth: Payer: Self-pay | Admitting: Internal Medicine

## 2020-01-15 MED ORDER — EPINEPHRINE 0.3 MG/0.3ML IJ SOAJ: INTRAMUSCULAR | 5 refills | Status: DC

## 2020-01-15 NOTE — Telephone Encounter (Signed)
Medication name and strength: zolpidem tartrate 10mg  Provider: [place]: [facility] Patient insurance ID: [id number] Phone: [phone] Fax: [phone]  Was the PA started on CMM?  yes If yes, please enter the Key: [id number] Timeframe for approval/denial: [facility] has not yet replied to your PA request. You may close this dialog, return to your dashboard, and perform other tasks.  To check for an update later, open this request again from your dashboard.  If [facility] has not replied to your request within 24 hours, please reach out to the plan using the phone number located on the back on the [employer] card.  Routing to [place] to follow up on.

## 2020-01-17 NOTE — Telephone Encounter (Signed)
PA for zolpidem was approved 7/13.  Approvedon July 13 Approved. This drug has been approved. Approved quantity: 30 tablets per 30 day(s). You may fill up to a 34 day supply at a retail pharmacy. You may fill up to a 90 day supply for maintenance drugs, please refer to the formulary for details. Please call the pharmacy to process your prescription claim.  Called pt's pharmacy and stated them this info and they were able to run it through. Called and spoke with pt letting her know this and she verbalized understanding. Nothing further needed.

## 2020-01-18 ENCOUNTER — Encounter: Payer: Self-pay | Admitting: Internal Medicine

## 2020-01-18 ENCOUNTER — Other Ambulatory Visit: Payer: Self-pay

## 2020-01-18 ENCOUNTER — Ambulatory Visit (INDEPENDENT_AMBULATORY_CARE_PROVIDER_SITE_OTHER): Payer: BC Managed Care – PPO | Admitting: Internal Medicine

## 2020-01-18 VITALS — BP 130/68 | HR 82 | Temp 98.4°F | Ht 60.0 in | Wt 196.6 lb

## 2020-01-18 DIAGNOSIS — J9611 Chronic respiratory failure with hypoxia: Secondary | ICD-10-CM

## 2020-01-18 DIAGNOSIS — J4551 Severe persistent asthma with (acute) exacerbation: Secondary | ICD-10-CM

## 2020-01-18 DIAGNOSIS — R0789 Other chest pain: Secondary | ICD-10-CM | POA: Diagnosis not present

## 2020-01-18 DIAGNOSIS — J4540 Moderate persistent asthma, uncomplicated: Secondary | ICD-10-CM | POA: Diagnosis not present

## 2020-01-18 MED ORDER — BENRALIZUMAB 30 MG/ML ~~LOC~~ SOSY
30.0000 mg | PREFILLED_SYRINGE | Freq: Once | SUBCUTANEOUS | Status: AC
  Administered 2020-01-18: 30 mg via SUBCUTANEOUS
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
## 2020-01-18 NOTE — Patient Instructions (Signed)
Get Fasenra dose today if available in the office. Talk with staff about when next dose will be due.  Order - CXR  Dx Asthma severe persistent  Please call if we can help

## 2020-01-26 IMAGING — DX DG CHEST 2V
2 series · 2 of 2 positions shown · non-contrast
Comparison: 04/13/2019

CLINICAL DATA: 58-year-old female with pneumonia

EXAM:
CHEST - 2 VIEW

[chest pa]
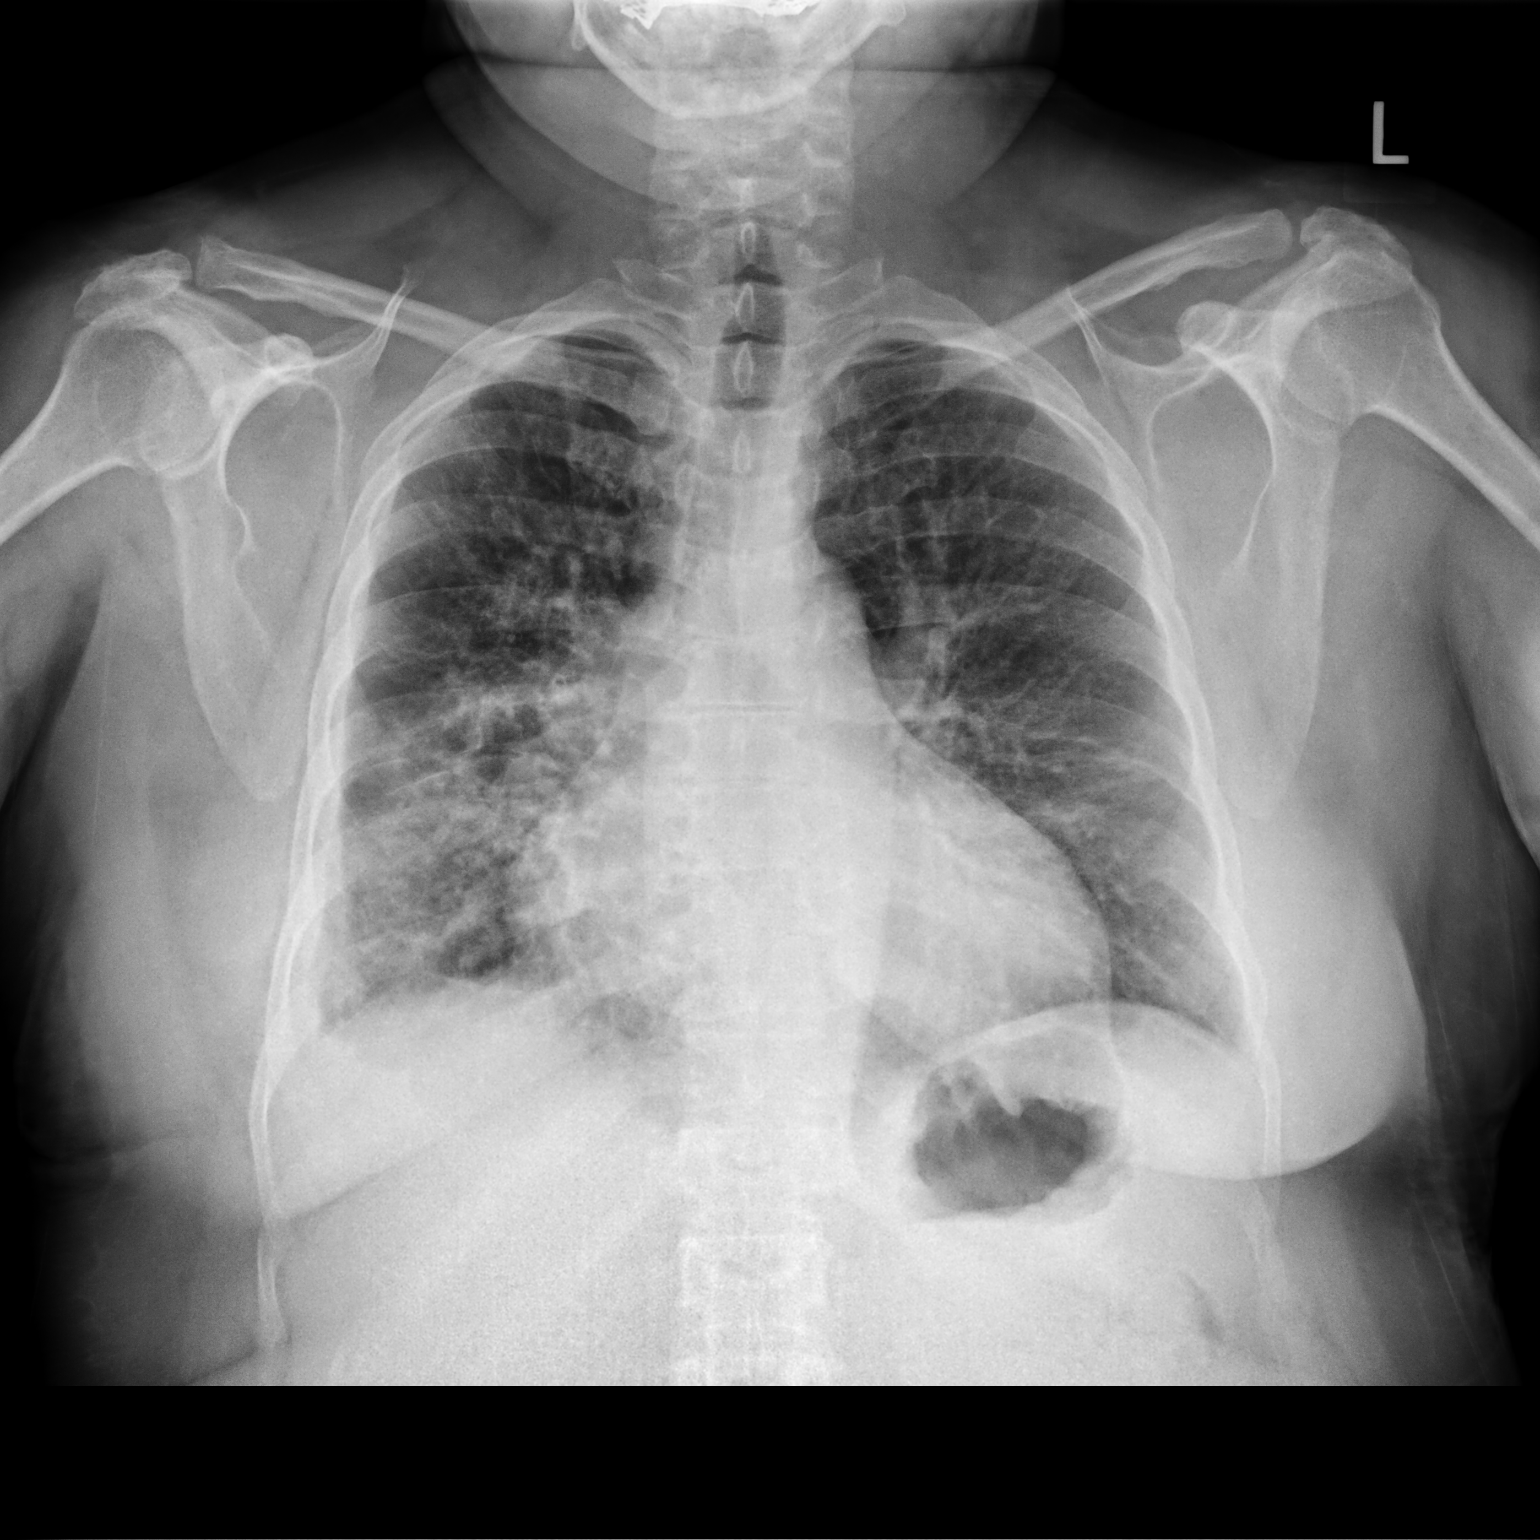

[chest lat]
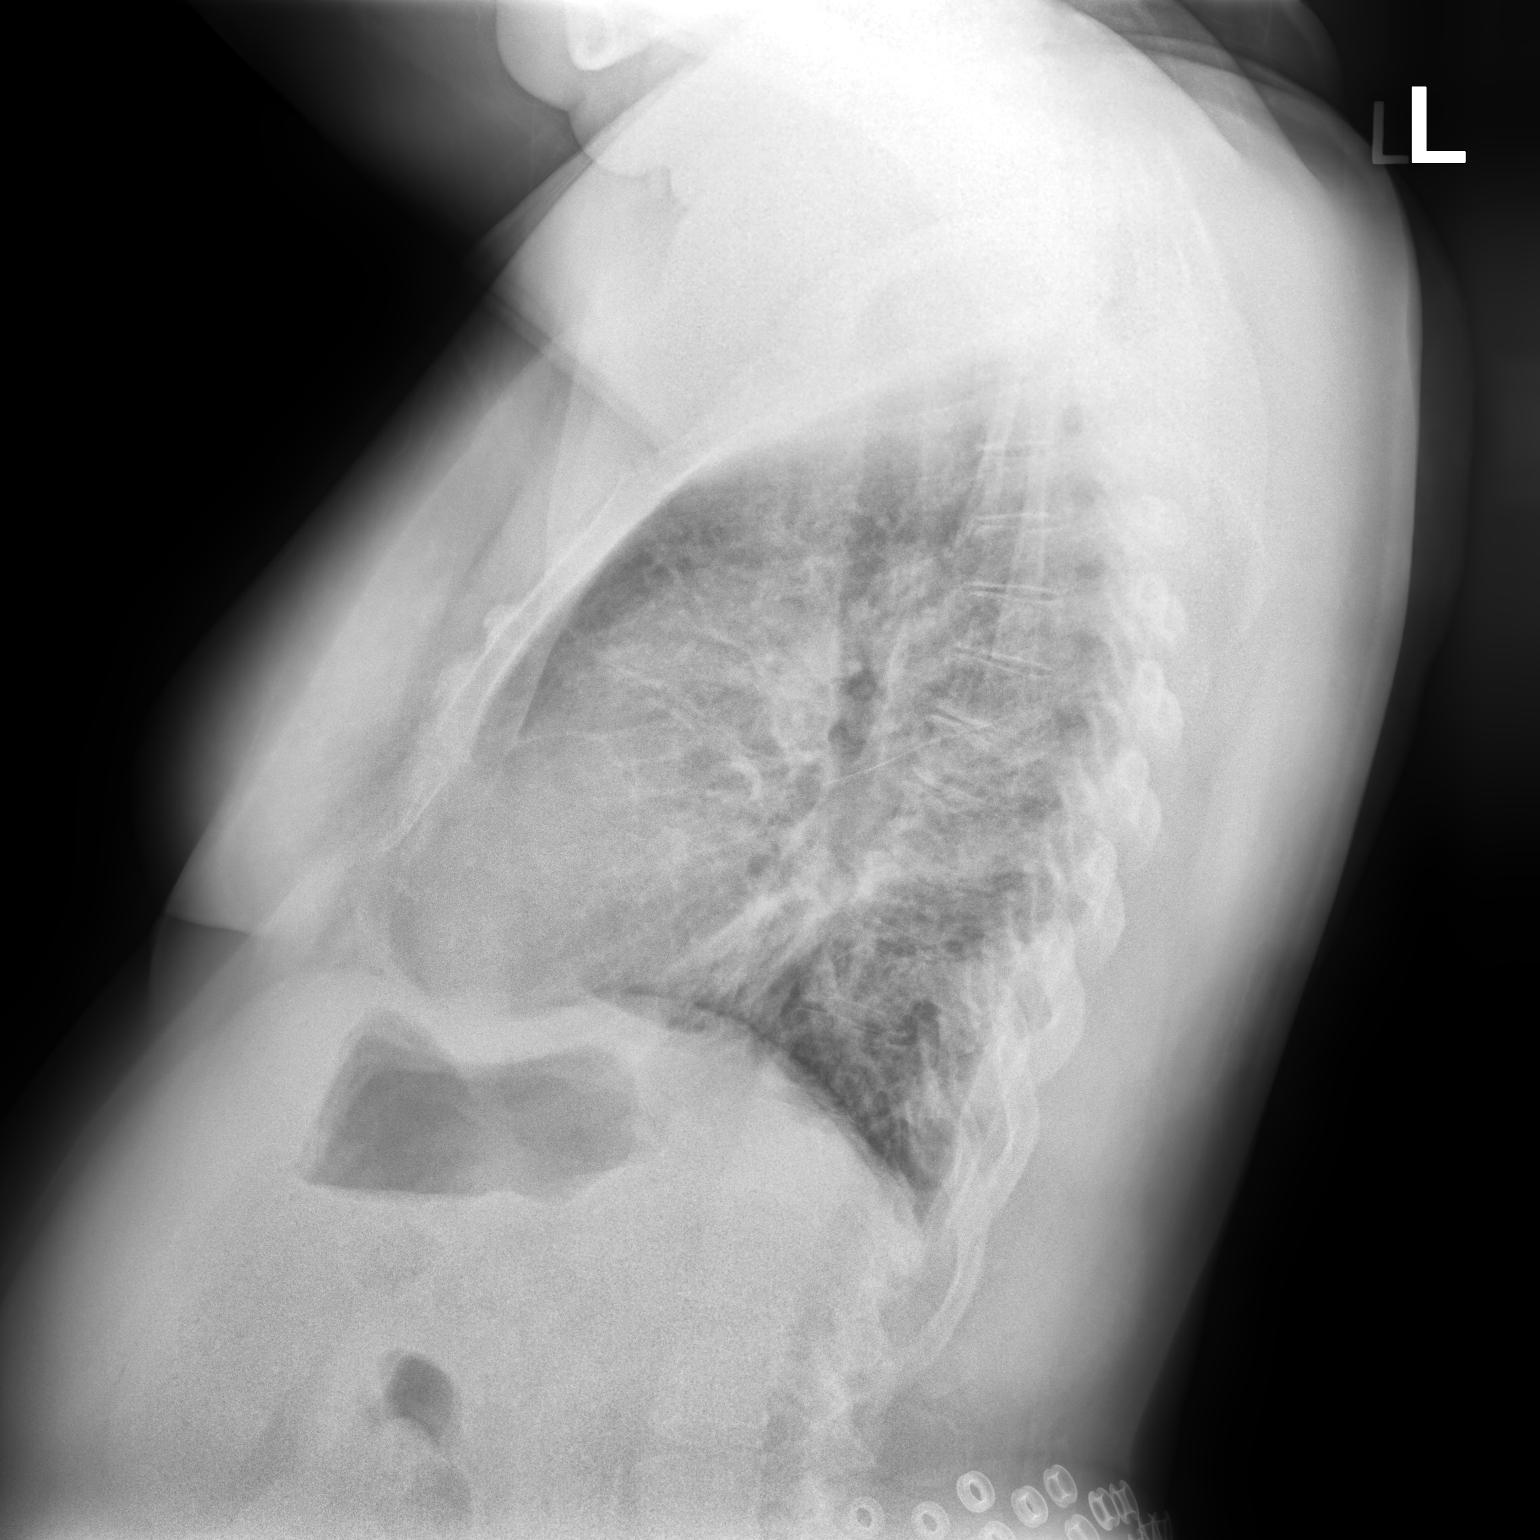

[2 of 2 positions shown; findings below may reference images not displayed]

FINDINGS: Cardiomediastinal silhouette unchanged in size and contour. No
interlobular septal thickening.

Reticulonodular opacities at the right lung base with mild
thickening of the pleuroparenchymal interface and blunting of the
right costophrenic angle. Aeration is improved from the comparison
plain film. Minimal reticular opacities at the left lung. No
displaced fracture.
IMPRESSION: Multifocal infection, predominantly at the right lung base, improved
when compared to the prior plain film

## 2020-01-28 ENCOUNTER — Other Ambulatory Visit: Payer: Self-pay | Admitting: Internal Medicine

## 2020-01-29 ENCOUNTER — Other Ambulatory Visit: Payer: Self-pay

## 2020-01-29 ENCOUNTER — Encounter: Payer: Self-pay | Admitting: Internal Medicine

## 2020-01-29 ENCOUNTER — Telehealth: Payer: Self-pay | Admitting: Internal Medicine

## 2020-01-29 ENCOUNTER — Ambulatory Visit (INDEPENDENT_AMBULATORY_CARE_PROVIDER_SITE_OTHER): Payer: BC Managed Care – PPO

## 2020-01-29 DIAGNOSIS — J4551 Severe persistent asthma with (acute) exacerbation: Secondary | ICD-10-CM

## 2020-01-29 DIAGNOSIS — R918 Other nonspecific abnormal finding of lung field: Secondary | ICD-10-CM | POA: Diagnosis not present

## 2020-01-29 DIAGNOSIS — R0789 Other chest pain: Secondary | ICD-10-CM | POA: Insufficient documentation

## 2020-01-29 DIAGNOSIS — J45909 Unspecified asthma, uncomplicated: Secondary | ICD-10-CM | POA: Diagnosis not present

## 2020-01-29 NOTE — Telephone Encounter (Signed)
Called and spoke with patient letting her know that letter has been printed and mailed. She requested that she come and pick it up. Another copy of letter printed and put up front for patient to pick up. Patient expressed understanding and stated she would either come today before 5 or in the morning.  Nothing further needed at this time.

## 2020-01-29 NOTE — Telephone Encounter (Signed)
Went to lobby to see Patient.  Patient stated she was here for cxr ordered by Dr [name]. Patient is requesting Dr [name] provide Patient with a work excuse for next 2 weeks, because of her shortness of breath, asthma, and chest pain. Made Patient aware that Dr [name] is in clinic, but message will be given to him. Patient request someone call [phone], when Patient can come pick up work excuse.  Message routed to Dr [name] to advise

## 2020-01-29 NOTE — Assessment & Plan Note (Signed)
Apparently has had this complaint off and on for a few years. Atypical, non-exertional and nonradiating, but seems to be pleuritic and probably is musculoskeletal. There may be multiple etiologiess- it is hardd to get a clear description or history. Plan- CXR

## 2020-01-29 NOTE — Assessment & Plan Note (Signed)
Resolved R pneumonia last winter. Fair control, but will do better when she gets back on Fasenra. Plan- continue present meds. Resume Fasenra with injection today. Education done.

## 2020-01-29 NOTE — Assessment & Plan Note (Signed)
To continue O2 2L sleep/ [employer].

## 2020-01-29 NOTE — Telephone Encounter (Signed)
Letter is printed and being mailed.

## 2020-01-29 NOTE — Telephone Encounter (Signed)
Patient is here for a chest x-ray and needed to speak to Dr. Janee Morn nurse.  Stated she would wait.  Did not give a reason for waiting.  Please advise.

## 2020-01-30 ENCOUNTER — Telehealth: Payer: Self-pay | Admitting: Internal Medicine

## 2020-01-30 NOTE — Progress Notes (Signed)
Spoke with pt and notified of results per Dr. Young Pt verbalized understanding and denied any questions. 

## 2020-01-30 NOTE — Telephone Encounter (Signed)
Called and spoke to pt.  She is requesting that Dr. [name] sign the letter that is in [place].  Pt is aware that Dr. [name] cannot sign a letter that is in [place], and we can not upload a signed letter into mychart.  Pt stated that she would come by on 01/31/2020 to pickup letter that was left at front desk on 01/28/2020. nothing further is needed at this time.

## 2020-01-31 ENCOUNTER — Ambulatory Visit: Payer: Medicaid Other | Admitting: Obstetrics

## 2020-02-03 DIAGNOSIS — Z419 Encounter for procedure for purposes other than remedying health state, unspecified: Secondary | ICD-10-CM | POA: Diagnosis not present

## 2020-02-03 DIAGNOSIS — I5032 Chronic diastolic (congestive) heart failure: Secondary | ICD-10-CM | POA: Diagnosis not present

## 2020-02-12 ENCOUNTER — Encounter: Payer: Self-pay | Admitting: Internal Medicine

## 2020-02-12 ENCOUNTER — Telehealth: Payer: Self-pay | Admitting: Internal Medicine

## 2020-02-12 ENCOUNTER — Other Ambulatory Visit: Payer: Self-pay | Admitting: Physician Assistant

## 2020-02-12 DIAGNOSIS — Z1231 Encounter for screening mammogram for malignant neoplasm of breast: Secondary | ICD-10-CM

## 2020-02-12 NOTE — Telephone Encounter (Signed)
Called and spoke with pt letting her know that the letter had been written and she verbalized understanding. Nothing further needed.

## 2020-02-12 NOTE — Telephone Encounter (Signed)
Called and spoke with pt. Pt is needing a letter to be written for her to be able to return back to work.  She states that the date for return back to work is to be 02/13/20.  Pt also needs to know if there are any restrictions that needs to be written in the letter. She also states that she usually works 10 hour days and wants the letter to state for her to work 8 hours daily.  Dr. [name], please advise on this for pt.

## 2020-02-12 NOTE — Telephone Encounter (Signed)
Letter has been printed and Amy will mail from [place]

## 2020-02-13 ENCOUNTER — Ambulatory Visit (INDEPENDENT_AMBULATORY_CARE_PROVIDER_SITE_OTHER): Payer: BC Managed Care – PPO | Admitting: Obstetrics

## 2020-02-13 ENCOUNTER — Encounter: Payer: Self-pay | Admitting: Obstetrics

## 2020-02-13 ENCOUNTER — Encounter: Payer: Self-pay | Admitting: *Deleted

## 2020-02-13 ENCOUNTER — Other Ambulatory Visit: Payer: Self-pay

## 2020-02-13 ENCOUNTER — Telehealth: Payer: Self-pay | Admitting: Internal Medicine

## 2020-02-13 ENCOUNTER — Other Ambulatory Visit (HOSPITAL_COMMUNITY)
Admission: RE | Admit: 2020-02-13 | Discharge: 2020-02-13 | Disposition: A | Discharge status: Home / Self Care | Payer: BC Managed Care – PPO | Source: Ambulatory Visit | Attending: Obstetrics | Admitting: Obstetrics

## 2020-02-13 VITALS — BP 152/93 | HR 114 | Wt 196.9 lb

## 2020-02-13 DIAGNOSIS — E669 Obesity, unspecified: Secondary | ICD-10-CM | POA: Diagnosis not present

## 2020-02-13 DIAGNOSIS — Z01419 Encounter for gynecological examination (general) (routine) without abnormal findings: Secondary | ICD-10-CM | POA: Insufficient documentation

## 2020-02-13 DIAGNOSIS — Z113 Encounter for screening for infections with a predominantly sexual mode of transmission: Secondary | ICD-10-CM | POA: Diagnosis not present

## 2020-02-13 DIAGNOSIS — Z780 Asymptomatic menopausal state: Secondary | ICD-10-CM

## 2020-02-13 DIAGNOSIS — N898 Other specified noninflammatory disorders of vagina: Secondary | ICD-10-CM | POA: Insufficient documentation

## 2020-02-13 NOTE — Progress Notes (Signed)
Pt presents for annual and pap Normal mammogram 03/2019 Colonoscopy UTD Pt declines genetic testing.

## 2020-02-13 NOTE — Telephone Encounter (Signed)
Spoke with the pt  She states unable to see the letter done on 02/12/20 in [place]  I did another letter for her and she can now see in her mychart account  I also mailed to her per her request  Nothing further needed

## 2020-02-13 NOTE — Telephone Encounter (Signed)
Pt calling back regarding letter to be uploaded in her myChart.  Needs before 5 o'clock.  [phone].  Please advise.
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
## 2020-02-14 LAB — CERVICOVAGINAL ANCILLARY ONLY
Bacterial Vaginitis (gardnerella): NEGATIVE
Candida Glabrata: NEGATIVE
Candida Vaginitis: NEGATIVE
Chlamydia: NEGATIVE
Comment: NEGATIVE
Comment: NEGATIVE
Comment: NEGATIVE
Comment: NEGATIVE
Comment: NEGATIVE
Comment: NORMAL
Neisseria Gonorrhea: NEGATIVE
Trichomonas: NEGATIVE

## 2020-02-14 LAB — HEPATITIS C ANTIBODY: Hep C Virus Ab: <0.1 s/co ratio (ref 0.0–0.9)

## 2020-02-14 LAB — CYTOLOGY - PAP
Comment: NEGATIVE
Diagnosis: NEGATIVE
High risk HPV: NEGATIVE

## 2020-02-14 LAB — HEPATITIS B SURFACE ANTIGEN: Hepatitis B Surface Ag: NEGATIVE

## 2020-02-14 LAB — HIV ANTIBODY (ROUTINE TESTING W REFLEX): HIV Screen 4th Generation wRfx: NONREACTIVE

## 2020-02-14 LAB — RPR: RPR Ser Ql: NONREACTIVE

## 2020-02-17 ENCOUNTER — Other Ambulatory Visit: Payer: Self-pay | Admitting: Internal Medicine

## 2020-02-21 ENCOUNTER — Telehealth: Payer: Self-pay | Admitting: Internal Medicine

## 2020-02-25 NOTE — Telephone Encounter (Signed)
[name] Order: 30mg  #1 prefilled syringe Ordered date: 02/25/20 Expected date of arrival: 03/04/20 Ordered by: [place]: [facility]

## 2020-02-26 DIAGNOSIS — F332 Major depressive disorder, recurrent severe without psychotic features: Secondary | ICD-10-CM | POA: Diagnosis not present

## 2020-03-04 NOTE — Telephone Encounter (Signed)
Fasenra Shipment Received:  30mg  #1 prefilled syringe Medication arrival date: 03/04/20 Lot #: [id number] Exp date: 04/03/2021 Received by: Elliot Dally

## 2020-03-05 DIAGNOSIS — Z419 Encounter for procedure for purposes other than remedying health state, unspecified: Secondary | ICD-10-CM | POA: Diagnosis not present

## 2020-03-06 ENCOUNTER — Ambulatory Visit: Payer: Medicaid Other

## 2020-03-16 ENCOUNTER — Other Ambulatory Visit: Payer: Self-pay | Admitting: Obstetrics

## 2020-03-16 DIAGNOSIS — B3731 Acute candidiasis of vulva and vagina: Secondary | ICD-10-CM

## 2020-03-20 ENCOUNTER — Other Ambulatory Visit: Payer: Self-pay

## 2020-03-20 ENCOUNTER — Ambulatory Visit (INDEPENDENT_AMBULATORY_CARE_PROVIDER_SITE_OTHER): Payer: BC Managed Care – PPO

## 2020-03-20 DIAGNOSIS — J4551 Severe persistent asthma with (acute) exacerbation: Secondary | ICD-10-CM

## 2020-03-20 DIAGNOSIS — Z23 Encounter for immunization: Secondary | ICD-10-CM

## 2020-03-20 MED ORDER — BENRALIZUMAB 30 MG/ML ~~LOC~~ SOSY
30.0000 mg | PREFILLED_SYRINGE | Freq: Once | SUBCUTANEOUS | Status: AC
  Administered 2020-03-20: 30 mg via SUBCUTANEOUS

## 2020-03-20 NOTE — Progress Notes (Signed)
Have you been hospitalized within the last 10 days?  No Do you have a fever?  No Do you have a cough?  No Do you have a headache or sore throat? No Do you have your Epi Pen visible and is it within date?  Yes 

## 2020-03-28 ENCOUNTER — Other Ambulatory Visit: Payer: Self-pay | Admitting: Internal Medicine

## 2020-03-28 ENCOUNTER — Other Ambulatory Visit: Payer: Self-pay | Admitting: Obstetrics

## 2020-03-28 DIAGNOSIS — B9689 Other specified bacterial agents as the cause of diseases classified elsewhere: Secondary | ICD-10-CM

## 2020-03-28 DIAGNOSIS — B3731 Acute candidiasis of vulva and vagina: Secondary | ICD-10-CM

## 2020-03-31 ENCOUNTER — Ambulatory Visit: Payer: Medicaid Other

## 2020-04-04 DIAGNOSIS — Z419 Encounter for procedure for purposes other than remedying health state, unspecified: Secondary | ICD-10-CM | POA: Diagnosis not present

## 2020-04-08 ENCOUNTER — Other Ambulatory Visit: Payer: Self-pay | Admitting: Obstetrics

## 2020-04-08 DIAGNOSIS — B3731 Acute candidiasis of vulva and vagina: Secondary | ICD-10-CM

## 2020-04-09 ENCOUNTER — Telehealth: Payer: Self-pay | Admitting: Internal Medicine

## 2020-04-09 NOTE — Telephone Encounter (Signed)
Spoke with the pt  She states that her breathing was not improved while she was on bevespi  She has been started on Fascenra though and feels some improvment since her first injection  She states that she is using her proair about once per day

## 2020-04-09 NOTE — Telephone Encounter (Signed)
Spoke with the pt  She states that she tried taking the bevespi for about 2 months  Each time she would use it, she would develop itchy/irritated throat and dry cough  She states it also caused tongue to be red  She has stopped med and symptoms are gone  Please advise, thanks!

## 2020-04-09 NOTE — Telephone Encounter (Signed)
Probably thrush from steroid. Did it help her breathing? How is her breathing doing without Bevespi?

## 2020-04-10 NOTE — Telephone Encounter (Signed)
Spoke with pt and advised of Dr Janee Morn recommendations. PT verbalized understanding. Nothing further needed at this time.

## 2020-04-10 NOTE — Telephone Encounter (Signed)
Ok to stop Bevespi and not replace a maintenance inhaler for now, as long as she is doing well, pending her next ov.

## 2020-04-11 ENCOUNTER — Other Ambulatory Visit: Payer: Self-pay | Admitting: Internal Medicine

## 2020-04-11 NOTE — Telephone Encounter (Signed)
Pt is requesting refill on ZOLPIDEM TARTRATE 10 MG TABLET next ov 07/25/20

## 2020-04-11 NOTE — Telephone Encounter (Signed)
Pt was informed that Ambien was sent to pharmacy and is available for pickup verbalized understanding

## 2020-04-11 NOTE — Telephone Encounter (Signed)
Abien refill e-sent

## 2020-04-20 ENCOUNTER — Other Ambulatory Visit: Payer: Self-pay | Admitting: Internal Medicine

## 2020-04-21 ENCOUNTER — Encounter: Payer: Self-pay | Admitting: Internal Medicine

## 2020-04-21 NOTE — Telephone Encounter (Signed)
Pt is requesting refill on MONTELUKAST SOD 10 MG TABLET next ov 05/15/20

## 2020-04-21 NOTE — Telephone Encounter (Signed)
Scripts sent for singulair and Anoro. Anoro is replacing Bevespi inhaler

## 2020-04-22 ENCOUNTER — Ambulatory Visit
Admission: RE | Admit: 2020-04-22 | Discharge: 2020-04-22 | Disposition: A | Discharge status: Home / Self Care | Payer: Medicaid Other | Source: Ambulatory Visit | Attending: Physician Assistant | Admitting: Physician Assistant

## 2020-04-22 ENCOUNTER — Other Ambulatory Visit: Payer: Self-pay

## 2020-04-22 DIAGNOSIS — Z1231 Encounter for screening mammogram for malignant neoplasm of breast: Secondary | ICD-10-CM

## 2020-04-24 ENCOUNTER — Telehealth: Payer: Self-pay | Admitting: Internal Medicine

## 2020-04-24 NOTE — Telephone Encounter (Signed)
Called and spoke with Patient.  Patient stated she was called by [facility] requesting a new Fasenra order. Called and spoke with [facility].   New  Fasenra order placed.   [name] Order: 30mg  #1 prefilled syringe Ordered date: 04/24/20 Expected date of arrival: 04/29/20 Ordered by: [place]: [facility]

## 2020-04-29 NOTE — Telephone Encounter (Signed)
Fasenra Shipment Received:  30mg  #1 prefilled syringe Medication arrival date: 04/29/20 Lot #: [id number] Exp date: 06/03/2021 Received by: Elliot Dally

## 2020-04-30 ENCOUNTER — Other Ambulatory Visit: Payer: Self-pay | Admitting: Obstetrics

## 2020-04-30 ENCOUNTER — Other Ambulatory Visit: Payer: Self-pay | Admitting: Internal Medicine

## 2020-04-30 DIAGNOSIS — B373 Candidiasis of vulva and vagina: Secondary | ICD-10-CM

## 2020-04-30 DIAGNOSIS — B3731 Acute candidiasis of vulva and vagina: Secondary | ICD-10-CM

## 2020-05-01 NOTE — Telephone Encounter (Signed)
Pt is requesting refill on DOXYCYCLINE HYCLATE 100 MG TAB next ov 05/15/20

## 2020-05-01 NOTE — Telephone Encounter (Signed)
Doxycycline and prednisone refilled this time. In the future patient should call office for these and let us know what is going on.

## 2020-05-01 NOTE — Telephone Encounter (Signed)
Pt is requesting refill on PREDNISONE 10 MG TABLET next ov 05/15/20

## 2020-05-05 DIAGNOSIS — Z419 Encounter for procedure for purposes other than remedying health state, unspecified: Secondary | ICD-10-CM | POA: Diagnosis not present

## 2020-05-07 ENCOUNTER — Other Ambulatory Visit: Payer: Self-pay | Admitting: Obstetrics

## 2020-05-07 ENCOUNTER — Other Ambulatory Visit: Payer: Self-pay | Admitting: Internal Medicine

## 2020-05-07 DIAGNOSIS — B373 Candidiasis of vulva and vagina: Secondary | ICD-10-CM

## 2020-05-07 DIAGNOSIS — N760 Acute vaginitis: Secondary | ICD-10-CM

## 2020-05-07 DIAGNOSIS — B9689 Other specified bacterial agents as the cause of diseases classified elsewhere: Secondary | ICD-10-CM

## 2020-05-07 DIAGNOSIS — B3731 Acute candidiasis of vulva and vagina: Secondary | ICD-10-CM

## 2020-05-07 NOTE — Telephone Encounter (Signed)
Doxycycline refilled.

## 2020-05-07 NOTE — Telephone Encounter (Signed)
Pt is requesting refill on DOXYCYCLINE HYCLATE 100 MG TAB pt states coughing up mucus yellow in color amd sinus drianage has f/u on 06/01/20

## 2020-05-15 ENCOUNTER — Ambulatory Visit: Payer: Medicaid Other

## 2020-05-15 ENCOUNTER — Telehealth: Payer: Self-pay | Admitting: Internal Medicine

## 2020-05-15 NOTE — Telephone Encounter (Signed)
ATC Patient to reschedule injection appointment.  LM to call back.

## 2020-05-15 NOTE — Telephone Encounter (Signed)
PA request received from [facility]  Drug requested: Epipen CMM Key: [id number] Tried/failed:  Covered alternatives:  PA request has been sent to plan, and a determination is expected within 3 days.   Routing to [place] for follow-up.

## 2020-05-16 NOTE — Telephone Encounter (Signed)
Called and spoke with Patient.  Fasenra injection rescheduled for 05/22/20 at 0915.

## 2020-05-17 NOTE — Telephone Encounter (Signed)
Approved. This drug has been approved. Approved quantity: 2 <> per 28 day(s). You may fill up to a 34 day supply at a retail pharmacy. You may fill up to a 90 day supply for maintenance drugs, please refer to the formulary for details. Please call the pharmacy to process your prescription claim. Only specific National Drug Codes [facility]) are covered. The pharmacy can use NDC(s): <<[id number], [id number], [id number], [id number].  Spoke with pt and pharmacy. Notified of PA approval. Nothing further needed at this time.

## 2020-05-21 NOTE — Telephone Encounter (Signed)
Pt has been notified epinephrine pen has pen approved and is available for pick up from the pharmacy. Nothing further needed

## 2020-05-22 ENCOUNTER — Ambulatory Visit: Payer: Medicaid Other

## 2020-05-22 ENCOUNTER — Telehealth: Payer: Self-pay | Admitting: Internal Medicine

## 2020-05-23 NOTE — Telephone Encounter (Signed)
Called and spoke with Patient. Patient scheduled 05/27/20 at 4pm.

## 2020-05-27 ENCOUNTER — Ambulatory Visit (INDEPENDENT_AMBULATORY_CARE_PROVIDER_SITE_OTHER): Payer: BC Managed Care – PPO

## 2020-05-27 ENCOUNTER — Other Ambulatory Visit: Payer: Self-pay

## 2020-05-27 DIAGNOSIS — J4551 Severe persistent asthma with (acute) exacerbation: Secondary | ICD-10-CM

## 2020-05-27 MED ORDER — BENRALIZUMAB 30 MG/ML ~~LOC~~ SOSY
30.0000 mg | PREFILLED_SYRINGE | Freq: Once | SUBCUTANEOUS | Status: AC
  Administered 2020-05-27: 30 mg via SUBCUTANEOUS

## 2020-05-27 NOTE — Progress Notes (Signed)
Have you been hospitalized within the last 10 days?  No Do you have a fever?  No Do you have a cough?  No Do you have a headache or sore throat? No Do you have your Epi Pen visible and is it within date?  Yes 

## 2020-05-30 ENCOUNTER — Other Ambulatory Visit: Payer: Self-pay | Admitting: Internal Medicine

## 2020-05-30 ENCOUNTER — Other Ambulatory Visit: Payer: Self-pay | Admitting: Obstetrics

## 2020-05-30 DIAGNOSIS — B373 Candidiasis of vulva and vagina: Secondary | ICD-10-CM

## 2020-05-30 DIAGNOSIS — B3731 Acute candidiasis of vulva and vagina: Secondary | ICD-10-CM

## 2020-05-30 DIAGNOSIS — B9689 Other specified bacterial agents as the cause of diseases classified elsewhere: Secondary | ICD-10-CM

## 2020-06-04 DIAGNOSIS — Z419 Encounter for procedure for purposes other than remedying health state, unspecified: Secondary | ICD-10-CM | POA: Diagnosis not present

## 2020-06-15 ENCOUNTER — Other Ambulatory Visit: Payer: Self-pay | Admitting: Internal Medicine

## 2020-06-17 DIAGNOSIS — F332 Major depressive disorder, recurrent severe without psychotic features: Secondary | ICD-10-CM | POA: Diagnosis not present

## 2020-06-17 DIAGNOSIS — F4310 Post-traumatic stress disorder, unspecified: Secondary | ICD-10-CM | POA: Diagnosis not present

## 2020-06-17 DIAGNOSIS — F5101 Primary insomnia: Secondary | ICD-10-CM | POA: Diagnosis not present

## 2020-06-24 ENCOUNTER — Telehealth: Payer: Self-pay | Admitting: Internal Medicine

## 2020-06-25 NOTE — Telephone Encounter (Signed)
[name] Order: 30mg  #1 prefilled syringe Ordered date: 06/25/20 Expected date of arrival: 07/01/20 Ordered by: [place]: [facility]

## 2020-06-27 ENCOUNTER — Other Ambulatory Visit: Payer: Self-pay | Admitting: Internal Medicine

## 2020-06-27 ENCOUNTER — Other Ambulatory Visit: Payer: Self-pay | Admitting: Obstetrics

## 2020-06-27 DIAGNOSIS — B3731 Acute candidiasis of vulva and vagina: Secondary | ICD-10-CM

## 2020-07-01 NOTE — Telephone Encounter (Signed)
Fasenra Shipment Received:  30mg  #1 prefilled syringe Medication arrival date: 07/01/20 Lot #: [id number] Exp date: 08/04/2021 Received by: 08/06/2021

## 2020-07-05 DIAGNOSIS — Z419 Encounter for procedure for purposes other than remedying health state, unspecified: Secondary | ICD-10-CM | POA: Diagnosis not present

## 2020-07-16 ENCOUNTER — Other Ambulatory Visit: Payer: Self-pay | Admitting: Internal Medicine

## 2020-07-24 ENCOUNTER — Ambulatory Visit (INDEPENDENT_AMBULATORY_CARE_PROVIDER_SITE_OTHER): Payer: Medicaid Other

## 2020-07-24 ENCOUNTER — Other Ambulatory Visit: Payer: Self-pay

## 2020-07-24 DIAGNOSIS — J4551 Severe persistent asthma with (acute) exacerbation: Secondary | ICD-10-CM | POA: Diagnosis not present

## 2020-07-24 MED ORDER — BENRALIZUMAB 30 MG/ML ~~LOC~~ SOSY
30.0000 mg | PREFILLED_SYRINGE | Freq: Once | SUBCUTANEOUS | Status: AC
  Administered 2020-07-24: 30 mg via SUBCUTANEOUS

## 2020-07-24 NOTE — Progress Notes (Signed)
Have you been hospitalized within the last 10 days?  No Do you have a fever?  No Do you have a cough?  No Do you have a headache or sore throat? No  

## 2020-07-25 ENCOUNTER — Ambulatory Visit: Payer: Medicaid Other | Admitting: Internal Medicine

## 2020-08-05 DIAGNOSIS — Z419 Encounter for procedure for purposes other than remedying health state, unspecified: Secondary | ICD-10-CM | POA: Diagnosis not present

## 2020-08-07 ENCOUNTER — Telehealth: Payer: Self-pay | Admitting: Internal Medicine

## 2020-08-08 NOTE — Telephone Encounter (Signed)
[name] Order: 30mg  #1 prefilled syringe Ordered date: 08/08/20 Expected date of arrival: 08/21/20 Ordered by: [place]: [facility]

## 2020-08-21 ENCOUNTER — Other Ambulatory Visit: Payer: Self-pay | Admitting: Orthopedic Surgery

## 2020-08-21 ENCOUNTER — Telehealth: Payer: Self-pay | Admitting: *Deleted

## 2020-08-21 NOTE — Telephone Encounter (Signed)
   [facility] Pre-operative Risk Assessment    HEARTCARE STAFF: - Please ensure there is not already an duplicate clearance open for this procedure. - Under Visit Info/Reason for Call, type in Other and utilize the format Clearance MM/DD/YY or Clearance TBD. Do not use dashes or single digits. - If request is for dental extraction, please clarify the # of teeth to be extracted.  Request for surgical clearance:  1. What type of surgery is being performed? RIGHT SHOULDER ARTHROPLASTY ROTATOR CUFF REPAIR, SUBACROMIAL DECOMPRESSION   2. When is this surgery scheduled? 09/18/20   3. What type of clearance is required (medical clearance vs. Pharmacy clearance to hold med vs. Both)? MEDICAL  4. Are there any medications that need to be held prior to surgery and how long? NONE LISTED   5. Practice name and name of physician performing surgery? [facility]; DR. Larkin Ina CHANDLER   6. What is the office phone number? [phone]   7.   What is the office fax number? [phone]  8.   Anesthesia type (None, local, MAC, general) ? CHOICE   Julaine Hua 08/21/2020, 5:31 PM  _________________________________________________________________   (provider comments below)

## 2020-08-21 NOTE — Telephone Encounter (Signed)
Fasenra Shipment Received:  30mg  #1 prefilled syringe Medication arrival date: 08/21/20 Lot #: [id number] Exp date: 08/04/2021 Received by: Elliot Dally

## 2020-08-22 ENCOUNTER — Telehealth: Payer: Self-pay | Admitting: Internal Medicine

## 2020-08-22 ENCOUNTER — Other Ambulatory Visit: Payer: Self-pay | Admitting: Internal Medicine

## 2020-08-22 NOTE — Telephone Encounter (Signed)
   Primary Cardiologist: Jenkins Rouge, MD  Chart reviewed as part of pre-operative protocol coverage. Because of Kelly Butler's past medical history and time since last visit, she will require a follow-up visit in order to better assess preoperative cardiovascular risk.  Last seen 12/2019 via telemedicine. Last EKG on file 06/2019.  Pre-op covering staff: - Please schedule appointment and call patient to inform them. If patient already had an upcoming appointment within acceptable timeframe, please add "pre-op clearance" to the appointment notes so provider is aware. - Please contact requesting surgeon's office via preferred method (i.e, phone, fax) to inform them of need for appointment prior to surgery.  If applicable, this message will also be routed to pharmacy pool and/or primary cardiologist for input on holding anticoagulant/antiplatelet agent as requested below so that this information is available to the clearing provider at time of patient's appointment.   Loel Dubonnet, NP  08/22/2020, 2:29 PM

## 2020-08-22 NOTE — Telephone Encounter (Signed)
Patient fasenra injection rescheduled to 10/02/20. Patient was made aware of infusion clinic opening 09/15/20, and scheduled appointments may change. Understanding stated. Nothing further at this time.
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
## 2020-08-22 NOTE — Telephone Encounter (Signed)
Left message to call the office to schedule a pre op clearance appt. Pt's cardiologist is Dr. [name] though no appt's available at [place] pt to offer a appt at the [place] office in order to not delay the pt's surgery.

## 2020-08-25 ENCOUNTER — Ambulatory Visit: Payer: BC Managed Care – PPO | Admitting: Internal Medicine

## 2020-08-26 NOTE — Telephone Encounter (Signed)
   Primary Cardiologist: Jenkins Rouge, MD  Chart reviewed as part of pre-operative protocol coverage. Because of Kelly Butler's past medical history and time since last visit, she will require a follow-up visit in order to better assess preoperative cardiovascular risk.  This has been scheduled for 08/29/20 with Richardson Dopp, PA. Will route to requesting party's office so they are aware and remove from preop pool.    Loel Dubonnet, NP  08/26/2020, 12:00 PM

## 2020-08-26 NOTE — Telephone Encounter (Signed)
S/w the pt today and advised she is going to need an appt for pre op. Pt became upset and said she thought this was taken of already today. I stated to the pt that I did not see that anybody s/w her today. Pt said she was not ready for the appt for Friday and asked for anything closer to 09/18/20. I advised the pt that I did not have anything closer to 09/18/20. Pt began to raise her voice with me and I asked the pt to please not yell at me that I was trying to help her. Pt said fine for 2/25 appt with Richardson Dopp, PAC 12:15. Pt has been scheduled. I will send clearance notes to PA for upcoming appt.

## 2020-08-29 ENCOUNTER — Ambulatory Visit (INDEPENDENT_AMBULATORY_CARE_PROVIDER_SITE_OTHER): Payer: Medicaid Other | Admitting: Physician Assistant

## 2020-08-29 ENCOUNTER — Encounter: Payer: Self-pay | Admitting: Physician Assistant

## 2020-08-29 ENCOUNTER — Other Ambulatory Visit: Payer: Self-pay

## 2020-08-29 ENCOUNTER — Encounter: Payer: Self-pay | Admitting: *Deleted

## 2020-08-29 ENCOUNTER — Encounter: Payer: Self-pay | Admitting: Radiology

## 2020-08-29 VITALS — BP 130/62 | HR 91 | Ht 60.0 in | Wt 198.4 lb

## 2020-08-29 DIAGNOSIS — I10 Essential (primary) hypertension: Secondary | ICD-10-CM | POA: Diagnosis not present

## 2020-08-29 DIAGNOSIS — R072 Precordial pain: Secondary | ICD-10-CM | POA: Diagnosis not present

## 2020-08-29 DIAGNOSIS — J4540 Moderate persistent asthma, uncomplicated: Secondary | ICD-10-CM | POA: Diagnosis not present

## 2020-08-29 DIAGNOSIS — I5020 Unspecified systolic (congestive) heart failure: Secondary | ICD-10-CM

## 2020-08-29 DIAGNOSIS — R002 Palpitations: Secondary | ICD-10-CM | POA: Diagnosis not present

## 2020-08-29 DIAGNOSIS — Z01810 Encounter for preprocedural cardiovascular examination: Secondary | ICD-10-CM | POA: Diagnosis not present

## 2020-08-29 NOTE — Addendum Note (Signed)
Addended by: Nuala Alpha on: 08/29/2020 01:37 PM   Modules accepted: Orders

## 2020-08-29 NOTE — Patient Instructions (Addendum)
Medication Instructions:   Your physician recommends that you continue on your current medications as directed. Please refer to the Current Medication list given to you today.  *If you need a refill on your cardiac medications before your next appointment, please call your pharmacy*   Testing/Procedures:  Your physician has requested that you have a lexiscan myoview. For further information please visit [email or web address]. Please follow instruction sheet, as given.  PLEASE SCHEDULE THIS ASAP PER SCOTT WEAVER PA-C FOR PATIENT IS NEEDING THIS FOR PRE-OP CLEARANCE   ZIO AT Long term monitor-Live Telemetry  Your physician has requested you wear a ZIO patch monitor for _14_ days.  This is a single patch monitor. [employer] supplies one patch monitor per enrollment. Additional stickers are not available.  Please do not apply patch if you will be having a Nuclear Stress Test, Echocardiogram, Cardiac CT, MRI, or Chest Xray during the time frame you would be wearing the monitor. The patch cannot be worn during these tests. You cannot remove and re-apply the ZIO AT patch monitor.   Your ZIO patch monitor will be sent Fed Ex from [employer] directly to your home address. The monitor may also be mailed to a PO BOX if home delivery is not available. It may take 3-5 days to receive your monitor after you have been enrolled.  Once you have received you monitor, please review enclosed instructions. Your monitor has already been registered assigning a specific monitor serial # to you.   Applying the monitor  Shave hair from upper left chest.  Hold abrader disc by orange tab. Rub abrader in 40 strokes over left upper chest as indicated in your monitor instructions.  Clean area with 4 enclosed alcohol pads. Use all pads to ensure the area is cleaned thoroughly. Let dry.  Apply patch as indicated in monitor instructions. Patch will be placed under collarbone on left side of chest with arrow pointing  upward.  Rub patch adhesive wings for 2 minutes. Remove the white label marked "1". Remove the white label marked "2". Rub patch adhesive wings for 2 additional minutes.  While looking in a mirror, press and release button in center of patch. A small green light will flash 3-4 times. This will be your only indicator the monitor has been turned on.  Do not shower for the first 24 hours. You may shower after the first 24 hours.  Press the button if you feel a symptom. You will hear a small click. Record Date, Time and Symptom in the Patient Log.   Starting the Gateway  In your kit there is a Hydrographic surveyor box the size of a cellphone. This is Airline pilot. It transmits all your recorded data to [facility]. This box must stay within 10 feet of you at all times. Open the box and push the * button. There will be a light that blinks orange and then green a few times. When the light stops blinking, the Gateway is connected to the ZIO patch.  Call [employer] at [phone] to confirm your monitor is transmitting.   Returning your monitor  Remove your patch and place it inside the [place]. In the lower half of the Gateway there is a white bag with prepaid postage on it. Place Gateway in bag and seal. Mail package back to [place] as soon as possible. Your physician should have your final report approximately 7 days after you have mailed back your monitor.   Call [place] at [phone] if you have  questions regarding your ZIO AT patch monitor. Call them immediately if you see an orange light blinking on your monitor.  If your monitor falls off in less than 4 days contact our Monitor department at [phone]. If your monitor becomes loose or falls off after 4 days call [employer] at [phone] for suggestions on securing your monitor.      Follow-Up:  AS PLANNED WITH DR. [name] AS A [facility] VIDEO VISIT FOR December 22, 2020

## 2020-08-29 NOTE — Progress Notes (Signed)
Enrolled patient for a 14 day [employer] Event Monitor to be mailed to patients home.  *switched from a  Zio AT because of insurance

## 2020-08-29 NOTE — Progress Notes (Addendum)
Cardiology Office Note:    Date:  08/29/2020   ID:  Kelly Butler, DOB [birth year], MRN [id number]  PCP:  [name], [place]  Cardiologist:  Jenkins Rouge, MD   Advanced Practice Provider:  No care team member to display Electrophysiologist:  None       Referring MD: [name], PA-C   Chief Complaint:  Surgical Clearance    Patient Profile:    Kelly Butler is a 60 y.o. female with:   HFimpEF (heart failure with improved ejection fraction)   Non-ischemic cardiomyopathy   EF 2018: 40-45  Echocardiogram 07/2018: EF 50-55  Cath 2018: normal coronary arteries   Asthma, OHS/restriction (Dr. [name])  Chronic respiratory failure   Home O2 for sleep and prn  Hypertension   GERD  Prior CV studies:   ECHO COMPLETE WO IMAGING ENHANCING AGENT 07/24/2018 Mild LVH, EF 50-55, no RWMA, GR 1 DD, mild AI, mild MR, trivial pericardial effusion  RIGHT/LEFT HEART CATH AND CORONARY ANGIOGRAPHY 06/17/2017 1. Normal coronary anatomy 2. Mild to moderate LV dysfunction. EF estimated at 40-45% 3. Moderate pulmonary HTN 4. Moderately elevated LV filling pressures. 5. Reduced cardiac output.  ECHO COMPLETE WO IMAGING ENHANCING AGENT 04/08/2017 Mild concentric LVH, EF 45-50, anterolateral and inferolateral HK, GRII DD, mild AI, mild MR, mild LAE, normal RVSF, PASP 38, mild TR.   History of Present Illness:    Kelly Butler was last seen by Dr. [name] via Telemedicine in 6/21.  She needs R shoulder surgery next month with Dr. Tamera Punt at [facility].  She presents for surgical clearance.  She is here with her grandson.  She notes substernal chest pain described as sharp or an ache.  She has been having this for the last several months.  It does not seem to be getting any worse. She notes pain at rest.  She notes the development of pain or worsening pain with exertion.  She has exertional shortness of breath.  This is fairly chronic from her  asthma.  She has symptoms of chest pain related to asthma but her current symptoms are not like her asthma symptoms.  She has not had syncope.  She does have episodes of rapid palpitations.  This occurs at rest.        Past Medical History:  Diagnosis Date  . Abnormal heart rhythm   . Allergic rhinitis    skin test POS 11/08/08  . Asthma   . Disorder of vocal cord   . Fibromyalgia   . GERD (gastroesophageal reflux disease)   . Hypertension   . Sleep apnea    questionable    Current Medications: Current Meds  Medication Sig  . albuterol (PROAIR HFA) 108 (90 Base) MCG/ACT inhaler TAKE 2 PUFFS EVERY 4 HOURS AS NEEDED -RESCUE  . ALPRAZolam (XANAX) 0.5 MG tablet Take 0.5 mg by mouth 3 (three) times daily as needed (anxiety). Anxiety.  . citalopram (CELEXA) 20 MG tablet Take 20 mg by mouth daily.  [name] doxycycline (VIBRA-TABS) 100 MG tablet TAKE 2 TABLETS TODAY AND THEN 1 TABLET DAILY.  [name] EPINEPHRINE 0.3 mg/0.3 mL IJ SOAJ injection INJECT INTO THE THIGH ONCE FOR SEVERE ALLERGIC REACTION.  [name] FASENRA 30 MG/ML SOSY INJECT 1 SYRINGE UNDER THE SKIN EVERY 8 WEEKS.  . fluconazole (DIFLUCAN) 150 MG tablet TAKE 1 TABLET BY MOUTH AS ONE DOSE  . ipratropium-albuterol (DUONEB) 0.5-2.5 (3) MG/3ML SOLN USE 1 VIAL VIA NEBULIZER EVERY 4 HRS AS NEEDED  .  isosorbide mononitrate (IMDUR) 30 MG 24 hr tablet Take 1 tablet (30 mg total) by mouth daily.  [name] LATUDA 40 MG TABS tablet Take 20 mg by mouth every evening.  [name] losartan (COZAAR) 100 MG tablet Take 1 tablet (100 mg total) by mouth daily.  . methocarbamol (ROBAXIN) 500 MG tablet Take 1 tablet (500 mg total) by mouth 2 (two) times daily. May cause drowsiness  . metoprolol tartrate (LOPRESSOR) 25 MG tablet Take 1 tablet (25 mg total) by mouth 2 (two) times daily.  . metroNIDAZOLE (METROGEL) 0.75 % vaginal gel PLACE 1 APPLICATORFUL VAGINALLY 2 (TWO) TIMES DAILY.  . montelukast (SINGULAIR) 10 MG tablet Take 1 tablet (10 mg total) by mouth daily.  [name] omeprazole  (PRILOSEC) 40 MG capsule Take 40 mg by mouth 2 (two) times daily.  . ondansetron (ZOFRAN) 4 MG tablet Take 4 mg by mouth every 8 (eight) hours as needed for nausea or vomiting.   [name] spironolactone (ALDACTONE) 25 MG tablet Take 1 tablet (25 mg total) by mouth daily.  [name] torsemide (DEMADEX) 20 MG tablet Take 1 tablet (20 mg total) by mouth 2 (two) times daily.  [name] umeclidinium-vilanterol (ANORO ELLIPTA) 62.5-25 MCG/INH AEPB TAKE 1 PUFF BY MOUTH EVERY DAY  . zolpidem (AMBIEN) 10 MG tablet ONE HALF OR 1 TAB AT BEDTIME FOR SLEEP AS NEEDED     Allergies:   Celebrex [celecoxib], Pregabalin, and Sulfa antibiotics   Social History   Tobacco Use  . Smoking status: Former Smoker    Packs/day: 0.30    Years: 6.00    Pack years: 1.80    Quit date: 07/05/1989    Years since quitting: 31.1  . Smokeless tobacco: Never Used  Vaping Use  . Vaping Use: Never used  Substance Use Topics  . Alcohol use: No  . Drug use: No     Family Hx: The patient's family history includes Asthma in her mother and son; Breast cancer in her mother; Coronary artery disease in her father and mother; Depression in her mother; Diabetes in her brother; Heart disease in her father and mother; Hypertension in her brother, father, and mother; Stroke in her father and maternal grandfather.  ROS   EKGs/Labs/Other Test Reviewed:    EKG:  EKG is   ordered today.  The ekg ordered today demonstrates NSR, HR 91, left axis deviation, no ST-T wave changes, QTC 437  Recent Labs: 10/16/2019: BUN 12; Creatinine, Ser 0.59; Hemoglobin 11.7; Platelets 340.0; Potassium 3.5; Pro B Natriuretic peptide (BNP) 26.0; Sodium 140   Recent Lipid Panel Lab Results  Component Value Date/Time   CHOL 126 01/03/2018 10:43 AM   TRIG 167 (H) 01/03/2018 10:43 AM   HDL 42 01/03/2018 10:43 AM   CHOLHDL 3.0 01/03/2018 10:43 AM   LDLCALC 51 01/03/2018 10:43 AM      Risk Assessment/Calculations:      Physical Exam:    VS:  BP 130/62   Pulse 91   Ht  5' (1.524 m)   Wt 198 lb 6.4 oz (90 kg)   LMP 10/17/2017 Comment: tubal ligation  SpO2 97%   BMI 38.75 kg/m     Wt Readings from Last 3 Encounters:  08/29/20 198 lb 6.4 oz (90 kg)  02/13/20 196 lb 14.4 oz (89.3 kg)  01/18/20 196 lb 9.6 oz (89.2 kg)     Constitutional:      Appearance: Healthy appearance. Not in distress.  Neck:     Vascular: No JVR. JVD normal.  Pulmonary:  Effort: Pulmonary effort is normal.     Breath sounds: No wheezing. No rales.     Comments: Decreased breath sounds Cardiovascular:     Normal rate. Regular rhythm. Normal S1. Normal S2.     Murmurs: There is a grade 1/6 early systolic murmur at the URSB.  Edema:    Peripheral edema absent.  Abdominal:     Palpations: Abdomen is soft.  Skin:    General: Skin is warm and dry.  Neurological:     Mental Status: Alert and oriented to person, place and time.     Cranial Nerves: Cranial nerves are intact.      ASSESSMENT & PLAN:    1. Preoperative cardiovascular examination 2. Precordial pain   Kelly Butler's perioperative risk of a major cardiac event is 0.9% according to the Revised Cardiac Risk Index (RCRI).  This constitutes low risk for perioperative CV complications.  She had a cardiac catheterization in 2018 that was normal.  However, she does note symptoms of exertional chest discomfort and it has been > 3 years since her catheterization.     Recommendations: According to ACC/AHA guidelines, the patient will require stress testing for further risk stratification.  3. HFimpEF (heart failure with improved EF) EF previously 40-45.  This improved to 50-55 on echocardiogram 07/2018.  Overall, her volume status appears stable.  She is mainly limited by asthma.  NYHA IIb-III.  Continue current dose of isosorbide, losartan, metoprolol tartrate, spironolactone, torsemide  4. Essential hypertension The patient's blood pressure is controlled on her current regimen.  Continue current therapy.   5. Allergic  asthma, moderate persistent, uncomplicated She may benefit from pulmonary consultation prior to her surgical procedure.  6. Palpitations Etiology not entirely clear.  Arrange 14-day Zio patch monitor.  She will not need to complete her monitor prior to her surgery.   Shared Decision Making/Informed Consent The risks [chest pain, shortness of breath, cardiac arrhythmias, dizziness, blood pressure fluctuations, myocardial infarction, stroke/transient ischemic attack, nausea, vomiting, allergic reaction, radiation exposure, metallic taste sensation and life-threatening complications (estimated to be 1 in 10,000)], benefits (risk stratification, diagnosing coronary artery disease, treatment guidance) and alternatives of a nuclear stress test were discussed in detail with Kelly Butler and she agrees to proceed.   Dispo:  Return in 4 months (on 12/22/2020) for Scheduled Follow Up w/ Dr. [name].   Medication Adjustments/Labs and Tests Ordered: Current medicines are reviewed at length with the patient today.  Concerns regarding medicines are outlined above.  Tests Ordered: Orders Placed This Encounter  Procedures  . Cardiac Stress Test: Informed Consent Details: Physician/Practitioner Attestation; Transcribe to consent form and obtain patient signature  . MYOCARDIAL PERFUSION IMAGING  . LONG TERM MONITOR-LIVE TELEMETRY (3-14 DAYS)  . EKG 12-Lead   Medication Changes: No orders of the defined types were placed in this encounter.   Signed, Richardson Dopp, PA-C  08/29/2020 1:08 PM    [facility] [address], [place], [place]  [address] Phone: [phone]; Fax: [phone]   ADDENDUM GATED SPECT MYO PERF W/LEXISCAN STRESS 1D 09/08/2020 Narrative  The left ventricular ejection fraction is mildly decreased (45-54%).  Nuclear stress EF: 52%.  No T wave inversion was noted during stress.  There was no ST segment deviation noted during stress.  This is a low risk  study.  Normal perfusion. LVEF 52% with normal wall motion. This is a low risk study. No prior for comparison.   As noted, the patient had a recent  stress test that was low risk. Therefore, she does not require any further CV testing. From a cardiac standpoint, she may proceed with her surgery at acceptable risk.   Richardson Dopp, PA-C    09/09/2020 10:45 AM

## 2020-08-31 ENCOUNTER — Other Ambulatory Visit: Payer: Self-pay | Admitting: Obstetrics

## 2020-08-31 DIAGNOSIS — B373 Candidiasis of vulva and vagina: Secondary | ICD-10-CM

## 2020-08-31 DIAGNOSIS — B3731 Acute candidiasis of vulva and vagina: Secondary | ICD-10-CM

## 2020-09-02 ENCOUNTER — Telehealth: Payer: Self-pay | Admitting: Pharmacy Technician

## 2020-09-02 ENCOUNTER — Other Ambulatory Visit (HOSPITAL_COMMUNITY): Payer: Self-pay | Admitting: Pharmacy Technician

## 2020-09-02 DIAGNOSIS — Z419 Encounter for procedure for purposes other than remedying health state, unspecified: Secondary | ICD-10-CM | POA: Diagnosis not present

## 2020-09-02 DIAGNOSIS — J4550 Severe persistent asthma, uncomplicated: Secondary | ICD-10-CM | POA: Insufficient documentation

## 2020-09-02 NOTE — Telephone Encounter (Signed)
Submitted a Prior Authorization request to [facility] for [facility] via phone (medical benefit). Will update once we receive a response.   PHONE# [phone] FAX# [phone]

## 2020-09-04 ENCOUNTER — Telehealth (HOSPITAL_COMMUNITY): Payer: Self-pay | Admitting: *Deleted

## 2020-09-04 NOTE — Telephone Encounter (Signed)
Patient given detailed instructions per Myocardial Perfusion Study Information Sheet for the test on 09/08/20. Patient notified to arrive 15 minutes early and that it is imperative to arrive on time for appointment to keep from having the test rescheduled.  If you need to cancel or reschedule your appointment, please call the office within 24 hours of your appointment. . Patient verbalized understanding.  Kelly Butler

## 2020-09-08 ENCOUNTER — Other Ambulatory Visit: Payer: Self-pay

## 2020-09-08 ENCOUNTER — Ambulatory Visit (HOSPITAL_COMMUNITY): Payer: BC Managed Care – PPO | Attending: Internal Medicine

## 2020-09-08 DIAGNOSIS — R072 Precordial pain: Secondary | ICD-10-CM | POA: Diagnosis not present

## 2020-09-08 DIAGNOSIS — I5020 Unspecified systolic (congestive) heart failure: Secondary | ICD-10-CM | POA: Diagnosis not present

## 2020-09-08 DIAGNOSIS — Z01810 Encounter for preprocedural cardiovascular examination: Secondary | ICD-10-CM | POA: Insufficient documentation

## 2020-09-08 LAB — MYOCARDIAL PERFUSION IMAGING
LV dias vol: 88 mL (ref 46–106)
LV sys vol: 42 mL
Peak HR: 122 {beats}/min
Rest HR: 83 {beats}/min
SDS: 0
SRS: 0
SSS: 0
TID: 1.06

## 2020-09-08 MED ORDER — REGADENOSON 0.4 MG/5ML IV SOLN
0.4000 mg | Freq: Once | INTRAVENOUS | Status: AC
Start: 2020-09-08 — End: 2020-09-08
  Administered 2020-09-08: 0.4 mg via INTRAVENOUS

## 2020-09-08 MED ORDER — TECHNETIUM TC 99M TETROFOSMIN IV KIT
10.1000 | PACK | Freq: Once | INTRAVENOUS | Status: AC | PRN
  Administered 2020-09-08: 10.1 via INTRAVENOUS
  Filled 2020-09-08: qty 11

## 2020-09-08 MED ORDER — TECHNETIUM TC 99M TETROFOSMIN IV KIT
31.4000 | PACK | Freq: Once | INTRAVENOUS | Status: AC | PRN
  Administered 2020-09-08: 31.4 via INTRAVENOUS
  Filled 2020-09-08: qty 32

## 2020-09-09 ENCOUNTER — Encounter: Payer: Self-pay | Admitting: Physician Assistant

## 2020-09-09 NOTE — Progress Notes (Signed)
DUE TO COVID-19 ONLY ONE VISITOR IS ALLOWED TO COME WITH YOU AND STAY IN THE WAITING ROOM ONLY DURING PRE OP AND PROCEDURE DAY OF SURGERY. THE 1 VISITOR  MAY VISIT WITH YOU AFTER SURGERY IN YOUR PRIVATE ROOM DURING VISITING HOURS ONLY!  YOU NEED TO HAVE A COVID 19 TEST ON__3/14/2022 _____ @_______ , THIS TEST MUST BE DONE BEFORE SURGERY,  COVID TESTING SITE [address] [place] [address], IT IS ON THE RIGHT GOING OUT [address] APPROXIMATELY  2 MINUTES PAST [employer] ON THE RIGHT. ONCE YOUR COVID TEST IS COMPLETED,  PLEASE BEGIN THE QUARANTINE INSTRUCTIONS AS OUTLINED IN YOUR HANDOUT.                Kelly Butler  09/09/2020   Your procedure is scheduled on:09/18/2020     Report to [facility]  Entrance   Report to admitting at      1230PM     Call this number if you have problems the morning of surgery [phone]    REMEMBER: NO  SOLID FOOD CANDY OR GUM AFTER MIDNIGHT. CLEAR LIQUIDS UNTIL 1130AM         . NOTHING BY MOUTH EXCEPT CLEAR LIQUIDS UNTIL    . PLEASE FINISH ENSURE DRINK PER SURGEON ORDER  WHICH NEEDS TO BE COMPLETED AT  1130AM     CLEAR LIQUID DIET   Foods Allowed                                                                    Coffee and tea, regular and decaf                            Fruit ices (not with fruit pulp)                                      Iced Popsicles                                    Carbonated beverages, regular and diet                                    Cranberry, grape and apple juices Sports drinks like Gatorade Lightly seasoned clear broth or consume(fat free) Sugar, honey syrup ___________________________________________________________________      BRUSH YOUR TEETH MORNING OF SURGERY AND RINSE YOUR MOUTH OUT, NO CHEWING GUM CANDY OR MINTS.     Take these medicines the morning of surgery with A SIP OF WATER: iNHALERS AS USUAL AND BRING, NEBULIZER IF NEEDED, IMDUR, METOPROLOL, SINGULAIR,  PRILOSEC   DO NOT TAKE ANY DIABETIC MEDICATIONS DAY OF YOUR SURGERY                               You may not have any metal on your body including hair pins and  piercings  Do not wear jewelry, make-up, lotions, powders or perfumes, deodorant             Do not wear nail polish on your fingernails.  Do not shave  48 hours prior to surgery.              Men may shave face and neck.   Do not bring valuables to the hospital. [place].  Contacts, dentures or bridgework may not be worn into surgery.  Leave suitcase in the car. After surgery it may be brought to your room.     Patients discharged the day of surgery will not be allowed to drive home. IF YOU ARE HAVING SURGERY AND GOING HOME THE SAME DAY, YOU MUST HAVE AN ADULT TO DRIVE YOU HOME AND BE WITH YOU FOR 24 HOURS. YOU MAY GO HOME BY TAXI OR UBER OR ORTHERWISE, BUT AN ADULT MUST ACCOMPANY YOU HOME AND STAY WITH YOU FOR 24 HOURS.  Name and phone number of your driver:  Special Instructions: N/A              Please read over the following fact sheets you were given: _____________________________________________________________________  [facility] - Preparing for Surgery Before surgery, you can play an important role.  Because skin is not sterile, your skin needs to be as free of germs as possible.  You can reduce the number of germs on your skin by washing with CHG (chlorahexidine gluconate) soap before surgery.  CHG is an antiseptic cleaner which kills germs and bonds with the skin to continue killing germs even after washing. Please DO NOT use if you have an allergy to CHG or antibacterial soaps.  If your skin becomes reddened/irritated stop using the CHG and inform your nurse when you arrive at Short Stay. Do not shave (including legs and underarms) for at least 48 hours prior to the first CHG shower.  You may shave your face/neck. Please follow these instructions  carefully:  1.  Shower with CHG Soap the night before surgery and the  morning of Surgery.  2.  If you choose to wash your hair, wash your hair first as usual with your  normal  shampoo.  3.  After you shampoo, rinse your hair and body thoroughly to remove the  shampoo.                           4.  Use CHG as you would any other liquid soap.  You can apply chg directly  to the skin and wash                       Gently with a scrungie or clean washcloth.  5.  Apply the CHG Soap to your body ONLY FROM THE NECK DOWN.   Do not use on face/ open                           Wound or open sores. Avoid contact with eyes, ears mouth and genitals (private parts).                       Wash face,  Genitals (private parts) with your normal soap.             6.  Wash thoroughly, paying special attention to the area where your surgery  will be performed.  7.  Thoroughly rinse your body with warm water from the neck down.  8.  DO NOT shower/wash with your normal soap after using and rinsing off  the CHG Soap.                9.  Pat yourself dry with a clean towel.            10.  Wear clean pajamas.            11.  Place clean sheets on your bed the night of your first shower and do not  sleep with pets. Day of Surgery : Do not apply any lotions/deodorants the morning of surgery.  Please wear clean clothes to the hospital/surgery center.  FAILURE TO FOLLOW THESE INSTRUCTIONS MAY RESULT IN THE CANCELLATION OF YOUR SURGERY PATIENT SIGNATURE_________________________________  NURSE SIGNATURE__________________________________  ________________________________________________________________________

## 2020-09-11 ENCOUNTER — Encounter (HOSPITAL_COMMUNITY): Payer: Self-pay

## 2020-09-11 ENCOUNTER — Other Ambulatory Visit: Payer: Self-pay

## 2020-09-11 ENCOUNTER — Encounter (HOSPITAL_COMMUNITY)
Admission: RE | Admit: 2020-09-11 | Discharge: 2020-09-11 | Disposition: A | Discharge status: Home / Self Care | Payer: No Typology Code available for payment source | Source: Ambulatory Visit | Attending: Orthopedic Surgery | Admitting: Orthopedic Surgery

## 2020-09-11 DIAGNOSIS — I10 Essential (primary) hypertension: Secondary | ICD-10-CM | POA: Insufficient documentation

## 2020-09-11 DIAGNOSIS — K219 Gastro-esophageal reflux disease without esophagitis: Secondary | ICD-10-CM | POA: Diagnosis not present

## 2020-09-11 DIAGNOSIS — J45909 Unspecified asthma, uncomplicated: Secondary | ICD-10-CM | POA: Insufficient documentation

## 2020-09-11 DIAGNOSIS — M75101 Unspecified rotator cuff tear or rupture of right shoulder, not specified as traumatic: Secondary | ICD-10-CM | POA: Diagnosis not present

## 2020-09-11 DIAGNOSIS — G4730 Sleep apnea, unspecified: Secondary | ICD-10-CM | POA: Insufficient documentation

## 2020-09-11 DIAGNOSIS — Z79899 Other long term (current) drug therapy: Secondary | ICD-10-CM | POA: Insufficient documentation

## 2020-09-11 DIAGNOSIS — Z01812 Encounter for preprocedural laboratory examination: Secondary | ICD-10-CM | POA: Insufficient documentation

## 2020-09-11 LAB — BASIC METABOLIC PANEL
Anion gap: 10 (ref 5–15)
BUN: 11 mg/dL (ref 6–20)
CO2: 25 mmol/L (ref 22–32)
Calcium: 9.5 mg/dL (ref 8.9–10.3)
Chloride: 105 mmol/L (ref 98–111)
Creatinine, Ser: 0.64 mg/dL (ref 0.44–1.00)
GFR, Estimated: >60 mL/min (ref >60)
Glucose, Bld: 163 mg/dL — ABNORMAL HIGH (ref 70–99)
Potassium: 3.5 mmol/L (ref 3.5–5.1)
Sodium: 140 mmol/L (ref 135–145)

## 2020-09-11 LAB — CBC
HCT: 40 % (ref 36.0–46.0)
Hemoglobin: 12.5 g/dL (ref 12.0–15.0)
MCH: 28.1 pg (ref 26.0–34.0)
MCHC: 31.3 g/dL (ref 30.0–36.0)
MCV: 89.9 fL (ref 80.0–100.0)
Platelets: 332 10*3/uL (ref 150–400)
RBC: 4.45 MIL/uL (ref 3.87–5.11)
RDW: 13.8 % (ref 11.5–15.5)
WBC: 16.5 10*3/uL — ABNORMAL HIGH (ref 4.0–10.5)
nRBC: 0 % (ref 0.0–0.2)

## 2020-09-11 NOTE — Progress Notes (Addendum)
Anesthesia Review:  PCP: Sharyne Peach  Cardiologist :08/25/20- Margaret Pyle- LOv Pulmonary- DR Baird Lyons LOv 01/18/2020  Chest x-ray :01/29/20  EKG : 08/29/20  Echo :07/24/18  Stress test:09/08/20 Cardiac Cath : 2018  Activity level: ?  Sleep Study/ CPAP : Fasting Blood Sugar :      / Checks Blood Sugar -- times a day:   Blood Thinner/ Instructions /Last Dose: ASA / Instructions/ Last Dose :  See progress note regardin pt 's behavior at preop appt.   CBC done 09/11/20 routed to DR [facility].  Pt stated she would use Inhalers am of surgery as usual but would  Not be taking any meds am of surgery.  Pt also refused hibiclens reporting her skin is sensitive.  Pt did take Ensure preop dirnk .   Pt also refused temp to be taken at preop stating that I had asked her to keep her mask on and she was not removing it for temp.

## 2020-09-11 NOTE — Progress Notes (Addendum)
Pt did not have mask covering nose at beginning of preop appt.  Asked pt to cover nose with mask.  Pt states " I cant breathe with mask covering nose.  Nurse informed pt that I would do preop appt as fast as possible .  Pt covered nose with mask.  When asked some of preop questions pt stated " what does this have to do with surgery' .  Explained to pt that they were necessary questions for anesthesia to know prior to surgery.  When going over instructions pt stated she was refusing to take any meds the am of surgery.. pt refused hibiclens.  Pt stated she had sensitive skin.  Instructed pt to take a shower with her regular soap nite before and am of surgery.  Pt refused to have temp done at preop stating " You told me to keep my mask up and my mask is not coming down.  Pt was texting on cell phone the whole preop appt.  Pt was also rude to [employer] when blood was being drawn.  Made Cheyenne Adas, RN charge nurse aware of the situation.   Larence Penning, AD made aware of this above incident.  Called and spoke with Marcelo Baldy , Scheduler at DR [facility] office and made her aware of above.  Bethena Roys reported that pt has been like this the whole entire procedure. Bethena Roys stated she was going to call Case Manager of pt ( workman's Comp) and let her be aware.

## 2020-09-12 ENCOUNTER — Telehealth: Payer: Self-pay | Admitting: Pharmacist

## 2020-09-12 DIAGNOSIS — J4540 Moderate persistent asthma, uncomplicated: Secondary | ICD-10-CM

## 2020-09-12 DIAGNOSIS — J4551 Severe persistent asthma with (acute) exacerbation: Secondary | ICD-10-CM
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
## 2020-09-12 NOTE — Telephone Encounter (Signed)
Patient transitioning to [place] on [facility] for further [place] injections. Dose 30mg  every 8 weeks.  Rx will be re-sent to [facility] for [place]. Next f/u visit with Dr. [name] is 09/15/20. Patient has been no-show to several OV. Will re-send rx after OV.  Knox Saliva, PharmD, MPH Clinical Pharmacist (Rheumatology and Pulmonology)
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
## 2020-09-15 ENCOUNTER — Encounter: Payer: Self-pay | Admitting: Internal Medicine

## 2020-09-15 ENCOUNTER — Other Ambulatory Visit: Payer: Self-pay

## 2020-09-15 ENCOUNTER — Ambulatory Visit (INDEPENDENT_AMBULATORY_CARE_PROVIDER_SITE_OTHER): Payer: BC Managed Care – PPO | Admitting: Internal Medicine

## 2020-09-15 ENCOUNTER — Other Ambulatory Visit (HOSPITAL_COMMUNITY)
Admission: RE | Admit: 2020-09-15 | Discharge: 2020-09-15 | Disposition: A | Discharge status: Home / Self Care | Payer: BC Managed Care – PPO | Source: Ambulatory Visit | Attending: Orthopedic Surgery | Admitting: Orthopedic Surgery

## 2020-09-15 DIAGNOSIS — J4540 Moderate persistent asthma, uncomplicated: Secondary | ICD-10-CM

## 2020-09-15 DIAGNOSIS — J9611 Chronic respiratory failure with hypoxia: Secondary | ICD-10-CM

## 2020-09-15 DIAGNOSIS — Z20822 Contact with and (suspected) exposure to covid-19: Secondary | ICD-10-CM | POA: Insufficient documentation

## 2020-09-15 DIAGNOSIS — Z01812 Encounter for preprocedural laboratory examination: Secondary | ICD-10-CM | POA: Diagnosis not present

## 2020-09-15 DIAGNOSIS — R739 Hyperglycemia, unspecified: Secondary | ICD-10-CM | POA: Insufficient documentation

## 2020-09-15 LAB — SARS CORONAVIRUS 2 (TAT 6-24 HRS): SARS Coronavirus 2: NEGATIVE

## 2020-09-15 MED ORDER — ALBUTEROL SULFATE HFA 108 (90 BASE) MCG/ACT IN AERS: INHALATION_SPRAY | RESPIRATORY_TRACT | 12 refills | Status: DC

## 2020-09-15 MED ORDER — ANORO ELLIPTA 62.5-25 MCG/INH IN AEPB: INHALATION_SPRAY | RESPIRATORY_TRACT | 12 refills | Status: DC

## 2020-09-15 MED ORDER — MONTELUKAST SODIUM 10 MG PO TABS: 10.0000 mg | ORAL_TABLET | Freq: Every day | ORAL | 4 refills | Status: DC

## 2020-09-15 NOTE — Patient Instructions (Signed)
You are clear from a pulmonary standpoint for planned shoulder surgery  Refills sent  You can continue Fasenra  Continue your oxygen at 2L when you sleep. We can look at that again next viist, ater your surgery  Ask your primary care person about your blood sugars- they have been running high  Please call if we can help

## 2020-09-15 NOTE — Assessment & Plan Note (Addendum)
Currently well-controlled.  No changes available that could reasonably be expected to improve surgical course, so she is "clear" to proceed with shoulder surgery from pulmonary standpoint

## 2020-09-15 NOTE — Assessment & Plan Note (Signed)
She continues O2 for sleep. Plan- we can check ONOX on room air after surgery.

## 2020-09-15 NOTE — Assessment & Plan Note (Signed)
Note blood sugars have been running high.  Plan- she will discuss w PCP at next visit.

## 2020-09-17 ENCOUNTER — Encounter: Payer: Self-pay | Admitting: Pharmacist

## 2020-09-18 ENCOUNTER — Other Ambulatory Visit: Payer: Self-pay | Admitting: Internal Medicine

## 2020-09-18 ENCOUNTER — Encounter (HOSPITAL_COMMUNITY): Payer: Self-pay | Admitting: Orthopedic Surgery

## 2020-09-18 ENCOUNTER — Ambulatory Visit: Payer: BC Managed Care – PPO

## 2020-09-18 ENCOUNTER — Ambulatory Visit (HOSPITAL_COMMUNITY): Payer: No Typology Code available for payment source | Admitting: Physician Assistant

## 2020-09-18 ENCOUNTER — Ambulatory Visit (HOSPITAL_COMMUNITY)
Admission: RE | Admit: 2020-09-18 | Discharge: 2020-09-18 | Disposition: A | Discharge status: Home / Self Care | Payer: No Typology Code available for payment source | Attending: Orthopedic Surgery | Admitting: Orthopedic Surgery

## 2020-09-18 ENCOUNTER — Ambulatory Visit (HOSPITAL_COMMUNITY): Payer: No Typology Code available for payment source | Admitting: Anesthesiology

## 2020-09-18 ENCOUNTER — Encounter (HOSPITAL_COMMUNITY)
Admission: RE | Disposition: A | Discharge status: Home / Self Care | Payer: Self-pay | Source: Home / Self Care | Attending: Orthopedic Surgery

## 2020-09-18 DIAGNOSIS — S43431A Superior glenoid labrum lesion of right shoulder, initial encounter: Secondary | ICD-10-CM | POA: Diagnosis not present

## 2020-09-18 DIAGNOSIS — Z79899 Other long term (current) drug therapy: Secondary | ICD-10-CM | POA: Insufficient documentation

## 2020-09-18 DIAGNOSIS — Z886 Allergy status to analgesic agent status: Secondary | ICD-10-CM | POA: Diagnosis not present

## 2020-09-18 DIAGNOSIS — S46011A Strain of muscle(s) and tendon(s) of the rotator cuff of right shoulder, initial encounter: Secondary | ICD-10-CM | POA: Diagnosis present

## 2020-09-18 DIAGNOSIS — Z888 Allergy status to other drugs, medicaments and biological substances status: Secondary | ICD-10-CM | POA: Insufficient documentation

## 2020-09-18 DIAGNOSIS — X58XXXA Exposure to other specified factors, initial encounter: Secondary | ICD-10-CM | POA: Diagnosis not present

## 2020-09-18 DIAGNOSIS — Z87891 Personal history of nicotine dependence: Secondary | ICD-10-CM | POA: Diagnosis not present

## 2020-09-18 DIAGNOSIS — M7541 Impingement syndrome of right shoulder: Secondary | ICD-10-CM | POA: Diagnosis not present

## 2020-09-18 DIAGNOSIS — Z882 Allergy status to sulfonamides status: Secondary | ICD-10-CM | POA: Diagnosis not present

## 2020-09-18 HISTORY — DX: Dyspnea, unspecified: R06.00

## 2020-09-18 HISTORY — PX: SHOULDER ARTHROSCOPY WITH ROTATOR CUFF REPAIR AND SUBACROMIAL DECOMPRESSION: SHX5686

## 2020-09-18 SURGERY — SHOULDER ARTHROSCOPY WITH ROTATOR CUFF REPAIR AND SUBACROMIAL DECOMPRESSION
Anesthesia: General | Laterality: Right

## 2020-09-18 MED ORDER — ACETAMINOPHEN 10 MG/ML IV SOLN
1000.0000 mg | Freq: Four times a day (QID) | INTRAVENOUS | Status: DC
  Administered 2020-09-18: 1000 mg via INTRAVENOUS

## 2020-09-18 MED ORDER — GLYCOPYRROLATE 0.2 MG/ML IJ SOLN
INTRAMUSCULAR | Status: DC | PRN
  Administered 2020-09-18: .1 mg via INTRAVENOUS

## 2020-09-18 MED ORDER — DEXAMETHASONE SODIUM PHOSPHATE 4 MG/ML IJ SOLN
INTRAMUSCULAR | Status: DC | PRN
  Administered 2020-09-18: 4 mg via INTRAVENOUS

## 2020-09-18 MED ORDER — METOPROLOL TARTRATE 25 MG PO TABS
25.0000 mg | ORAL_TABLET | ORAL | Status: AC
  Administered 2020-09-18: 25 mg via ORAL
  Filled 2020-09-18: qty 1

## 2020-09-18 MED ORDER — ROCURONIUM BROMIDE 10 MG/ML (PF) SYRINGE
PREFILLED_SYRINGE | INTRAVENOUS | Status: AC
  Filled 2020-09-18: qty 10

## 2020-09-18 MED ORDER — OXYCODONE HCL 5 MG PO TABS: 5.0000 mg | ORAL_TABLET | Freq: Once | ORAL | Status: DC | PRN

## 2020-09-18 MED ORDER — FENTANYL CITRATE (PF) 100 MCG/2ML IJ SOLN
INTRAMUSCULAR | Status: AC
  Filled 2020-09-18: qty 2

## 2020-09-18 MED ORDER — OXYCODONE-ACETAMINOPHEN 5-325 MG PO TABS: ORAL_TABLET | ORAL | 0 refills | Status: DC

## 2020-09-18 MED ORDER — DEXMEDETOMIDINE (PRECEDEX) IN NS 20 MCG/5ML (4 MCG/ML) IV SYRINGE
PREFILLED_SYRINGE | INTRAVENOUS | Status: DC | PRN
  Administered 2020-09-18: 4 ug via INTRAVENOUS
  Administered 2020-09-18: 8 ug via INTRAVENOUS

## 2020-09-18 MED ORDER — EPHEDRINE 5 MG/ML INJ
INTRAVENOUS | Status: AC
  Filled 2020-09-18: qty 10

## 2020-09-18 MED ORDER — LIDOCAINE 2% (20 MG/ML) 5 ML SYRINGE
INTRAMUSCULAR | Status: AC
  Filled 2020-09-18: qty 10

## 2020-09-18 MED ORDER — ACETAMINOPHEN 325 MG PO TABS
325.0000 mg | ORAL_TABLET | ORAL | Status: DC | PRN
Start: 2020-09-18 — End: 2020-09-18

## 2020-09-18 MED ORDER — FENTANYL CITRATE (PF) 100 MCG/2ML IJ SOLN
25.0000 ug | INTRAMUSCULAR | Status: DC | PRN
  Administered 2020-09-18 (×3): 50 ug via INTRAVENOUS

## 2020-09-18 MED ORDER — PROPOFOL 10 MG/ML IV BOLUS
INTRAVENOUS | Status: AC
  Filled 2020-09-18: qty 40

## 2020-09-18 MED ORDER — MIDAZOLAM HCL 2 MG/2ML IJ SOLN
1.0000 mg | Freq: Once | INTRAMUSCULAR | Status: AC
  Administered 2020-09-18: 1 mg via INTRAVENOUS

## 2020-09-18 MED ORDER — CEFAZOLIN SODIUM-DEXTROSE 2-4 GM/100ML-% IV SOLN
2.0000 g | INTRAVENOUS | Status: AC
  Administered 2020-09-18: 2 g via INTRAVENOUS
  Filled 2020-09-18: qty 100

## 2020-09-18 MED ORDER — ONDANSETRON HCL 4 MG/2ML IJ SOLN
INTRAMUSCULAR | Status: AC
  Filled 2020-09-18: qty 2

## 2020-09-18 MED ORDER — ORAL CARE MOUTH RINSE: 15.0000 mL | Freq: Once | OROMUCOSAL | Status: AC

## 2020-09-18 MED ORDER — ALBUTEROL SULFATE HFA 108 (90 BASE) MCG/ACT IN AERS
INHALATION_SPRAY | RESPIRATORY_TRACT | Status: DC | PRN
  Administered 2020-09-18: 2 via RESPIRATORY_TRACT

## 2020-09-18 MED ORDER — MEPERIDINE HCL 50 MG/ML IJ SOLN: 6.2500 mg | INTRAMUSCULAR | Status: DC | PRN

## 2020-09-18 MED ORDER — SODIUM CHLORIDE 0.9 % IR SOLN
Status: DC | PRN
  Administered 2020-09-18 (×2): 3000 mL

## 2020-09-18 MED ORDER — BUPIVACAINE LIPOSOME 1.3 % IJ SUSP
INTRAMUSCULAR | Status: DC | PRN
  Administered 2020-09-18: 10 mL via PERINEURAL

## 2020-09-18 MED ORDER — OXYCODONE HCL 5 MG/5ML PO SOLN: 5.0000 mg | Freq: Once | ORAL | Status: DC | PRN

## 2020-09-18 MED ORDER — ONDANSETRON HCL 4 MG/2ML IJ SOLN
INTRAMUSCULAR | Status: DC | PRN
  Administered 2020-09-18: 4 mg via INTRAVENOUS

## 2020-09-18 MED ORDER — ACETAMINOPHEN 160 MG/5ML PO SOLN: 325.0000 mg | ORAL | Status: DC | PRN

## 2020-09-18 MED ORDER — PHENYLEPHRINE HCL-NACL 10-0.9 MG/250ML-% IV SOLN
INTRAVENOUS | Status: DC | PRN
  Administered 2020-09-18: 25 ug/min via INTRAVENOUS

## 2020-09-18 MED ORDER — GLYCOPYRROLATE PF 0.2 MG/ML IJ SOSY
PREFILLED_SYRINGE | INTRAMUSCULAR | Status: AC
  Filled 2020-09-18: qty 1

## 2020-09-18 MED ORDER — SUGAMMADEX SODIUM 200 MG/2ML IV SOLN
INTRAVENOUS | Status: DC | PRN
  Administered 2020-09-18 (×4): 100 mg via INTRAVENOUS

## 2020-09-18 MED ORDER — ACETAMINOPHEN 10 MG/ML IV SOLN
INTRAVENOUS | Status: AC
  Filled 2020-09-18: qty 100

## 2020-09-18 MED ORDER — LIDOCAINE HCL (CARDIAC) PF 100 MG/5ML IV SOSY
PREFILLED_SYRINGE | INTRAVENOUS | Status: DC | PRN
  Administered 2020-09-18: 60 mg via INTRAVENOUS
  Administered 2020-09-18: 40 mg via INTRAVENOUS

## 2020-09-18 MED ORDER — ALBUTEROL SULFATE HFA 108 (90 BASE) MCG/ACT IN AERS
INHALATION_SPRAY | RESPIRATORY_TRACT | Status: AC
  Filled 2020-09-18: qty 6.7

## 2020-09-18 MED ORDER — PHENYLEPHRINE 40 MCG/ML (10ML) SYRINGE FOR IV PUSH (FOR BLOOD PRESSURE SUPPORT)
PREFILLED_SYRINGE | INTRAVENOUS | Status: AC
  Filled 2020-09-18: qty 10

## 2020-09-18 MED ORDER — PROPOFOL 10 MG/ML IV BOLUS
INTRAVENOUS | Status: DC | PRN
  Administered 2020-09-18: 150 mg via INTRAVENOUS
  Administered 2020-09-18: 50 mg via INTRAVENOUS

## 2020-09-18 MED ORDER — ROCURONIUM BROMIDE 100 MG/10ML IV SOLN
INTRAVENOUS | Status: DC | PRN
  Administered 2020-09-18: 50 mg via INTRAVENOUS

## 2020-09-18 MED ORDER — MIDAZOLAM HCL 2 MG/2ML IJ SOLN
INTRAMUSCULAR | Status: AC
  Filled 2020-09-18: qty 2

## 2020-09-18 MED ORDER — CHLORHEXIDINE GLUCONATE 0.12 % MT SOLN
15.0000 mL | Freq: Once | OROMUCOSAL | Status: AC
  Administered 2020-09-18: 15 mL via OROMUCOSAL

## 2020-09-18 MED ORDER — TIZANIDINE HCL 4 MG PO TABS: 4.0000 mg | ORAL_TABLET | Freq: Three times a day (TID) | ORAL | 1 refills | Status: DC | PRN

## 2020-09-18 MED ORDER — FENTANYL CITRATE (PF) 100 MCG/2ML IJ SOLN
INTRAMUSCULAR | Status: DC | PRN
  Administered 2020-09-18 (×2): 50 ug via INTRAVENOUS

## 2020-09-18 MED ORDER — BUPIVACAINE-EPINEPHRINE (PF) 0.5% -1:200000 IJ SOLN
INTRAMUSCULAR | Status: DC | PRN
  Administered 2020-09-18: 15 mL via PERINEURAL

## 2020-09-18 MED ORDER — FASENRA PEN 30 MG/ML ~~LOC~~ SOAJ: 30.0000 mg | SUBCUTANEOUS | 2 refills | Status: DC

## 2020-09-18 MED ORDER — ONDANSETRON HCL 4 MG/2ML IJ SOLN: 4.0000 mg | Freq: Once | INTRAMUSCULAR | Status: DC | PRN

## 2020-09-18 MED ORDER — LACTATED RINGERS IV SOLN: INTRAVENOUS | Status: DC

## 2020-09-18 MED ORDER — DEXAMETHASONE SODIUM PHOSPHATE 10 MG/ML IJ SOLN
INTRAMUSCULAR | Status: AC
  Filled 2020-09-18: qty 1

## 2020-09-18 MED ORDER — LACTATED RINGERS IV SOLN: INTRAVENOUS | Status: DC | PRN

## 2020-09-18 MED ORDER — MIDAZOLAM HCL 2 MG/2ML IJ SOLN
1.0000 mg | INTRAMUSCULAR | Status: AC
  Administered 2020-09-18: 2 mg via INTRAVENOUS
  Filled 2020-09-18: qty 2

## 2020-09-18 MED ORDER — FASENRA 30 MG/ML ~~LOC~~ SOSY: 30.0000 mg | PREFILLED_SYRINGE | SUBCUTANEOUS | 2 refills | Status: DC

## 2020-09-18 MED ORDER — FENTANYL CITRATE (PF) 100 MCG/2ML IJ SOLN
50.0000 ug | Freq: Once | INTRAMUSCULAR | Status: AC
  Administered 2020-09-18 (×2): 50 ug via INTRAVENOUS
  Filled 2020-09-18: qty 2

## 2020-09-18 SURGICAL SUPPLY — 63 items
AID PSTN UNV HD RSTRNT DISP (MISCELLANEOUS)
BOOTIES KNEE HIGH SLOAN (MISCELLANEOUS) ×4 IMPLANT
BURR OVAL 8 FLU 4.0X13 (MISCELLANEOUS) ×2 IMPLANT
CANNULA 5.75X7 CRYSTAL CLEAR (CANNULA) ×2 IMPLANT
CANNULA TWIST IN 8.25X7CM (CANNULA) IMPLANT
COOLER ICEMAN CLASSIC (MISCELLANEOUS) IMPLANT
COVER SURGICAL LIGHT HANDLE (MISCELLANEOUS) ×2 IMPLANT
COVER WAND RF STERILE (DRAPES) IMPLANT
CUTTER BONE 4.0MM X 13CM (MISCELLANEOUS) ×2 IMPLANT
DISSECTOR  3.8MM X 13CM (MISCELLANEOUS)
DISSECTOR 3.8MM X 13CM (MISCELLANEOUS) IMPLANT
DRAPE IMP U-DRAPE 54X76 (DRAPES) ×2 IMPLANT
DRAPE INCISE IOBAN 66X45 STRL (DRAPES) IMPLANT
DRAPE ORTHO SPLIT 77X108 STRL (DRAPES) ×4
DRAPE STERI 35X30 U-POUCH (DRAPES) ×2 IMPLANT
DRAPE SURG ORHT 6 SPLT 77X108 (DRAPES) ×2 IMPLANT
DRAPE U-SHAPE 47X51 STRL (DRAPES) ×4 IMPLANT
DRSG PAD ABDOMINAL 8X10 ST (GAUZE/BANDAGES/DRESSINGS) ×4 IMPLANT
DURAPREP 26ML APPLICATOR (WOUND CARE) ×2 IMPLANT
ELECT REM PT RETURN 15FT ADLT (MISCELLANEOUS) ×2 IMPLANT
GAUZE SPONGE 4X4 12PLY STRL (GAUZE/BANDAGES/DRESSINGS) ×2 IMPLANT
GAUZE XEROFORM 1X8 LF (GAUZE/BANDAGES/DRESSINGS) ×2 IMPLANT
GLOVE SRG 8 PF TXTR STRL LF DI (GLOVE) ×1 IMPLANT
GLOVE SURG ENC MOIS LTX SZ7 (GLOVE) ×2 IMPLANT
GLOVE SURG ENC MOIS LTX SZ7.5 (GLOVE) ×2 IMPLANT
GLOVE SURG UNDER POLY LF SZ7 (GLOVE) ×2 IMPLANT
GLOVE SURG UNDER POLY LF SZ8 (GLOVE) ×2
GOWN STRL REUS W/TWL LRG LVL3 (GOWN DISPOSABLE) ×2 IMPLANT
GOWN STRL REUS W/TWL XL LVL3 (GOWN DISPOSABLE) ×2 IMPLANT
KIT BASIN OR (CUSTOM PROCEDURE TRAY) ×2 IMPLANT
KIT PUSHLOCK 2.9 HIP (KITS) IMPLANT
KIT STR SPEAR 1.8 FBRTK DISP (KITS) IMPLANT
KIT TURNOVER KIT A (KITS) ×2 IMPLANT
LASSO 90 CVE QUICKPAS (DISPOSABLE) IMPLANT
LASSO CRESCENT QUICKPASS (SUTURE) IMPLANT
MANIFOLD NEPTUNE II (INSTRUMENTS) ×2 IMPLANT
NDL HYPO 25X1 1.5 SAFETY (NEEDLE) IMPLANT
NDL SCORPION MULTI FIRE (NEEDLE) IMPLANT
NEEDLE HYPO 25X1 1.5 SAFETY (NEEDLE) IMPLANT
NEEDLE SCORPION MULTI FIRE (NEEDLE) IMPLANT
PACK ARTHROSCOPY WL (CUSTOM PROCEDURE TRAY) ×2 IMPLANT
PAD COLD SHLDR WRAP-ON (PAD) IMPLANT
PENCIL SMOKE EVACUATOR (MISCELLANEOUS) IMPLANT
PROBE BIPOLAR ATHRO 135MM 90D (MISCELLANEOUS) ×2 IMPLANT
PROTECTOR NERVE ULNAR (MISCELLANEOUS) ×2 IMPLANT
RESTRAINT HEAD UNIVERSAL NS (MISCELLANEOUS) IMPLANT
SLING ARM FOAM STRAP LRG (SOFTGOODS) IMPLANT
SLING ARM FOAM STRAP MED (SOFTGOODS) ×1 IMPLANT
SLING ARM IMMOBILIZER LRG (SOFTGOODS) IMPLANT
SLING ARM IMMOBILIZER MED (SOFTGOODS) IMPLANT
SUPPORT WRAP ARM LG (MISCELLANEOUS) ×2 IMPLANT
SUT ETHILON 3 0 PS 1 (SUTURE) ×2 IMPLANT
SUT PDS AB 1 CT1 27 (SUTURE) IMPLANT
SUT TIGER TAPE 7 IN WHITE (SUTURE) IMPLANT
SUTURE TAPE 1.3 40 TPR END (SUTURE) IMPLANT
SUTURETAPE 1.3 40 TPR END (SUTURE)
TAPE FIBER 2MM 7IN #2 BLUE (SUTURE) IMPLANT
TAPE LABRALWHITE 1.5X36 (TAPE) IMPLANT
TAPE SUT LABRALTAP WHT/BLK (SUTURE) IMPLANT
TOWEL OR 17X26 10 PK STRL BLUE (TOWEL DISPOSABLE) ×2 IMPLANT
TOWEL OR NON WOVEN STRL DISP B (DISPOSABLE) ×2 IMPLANT
TUBING ARTHROSCOPY IRRIG 16FT (MISCELLANEOUS) ×2 IMPLANT
TUBING CONNECTING 10 (TUBING) ×4 IMPLANT

## 2020-09-18 NOTE — Telephone Encounter (Signed)
Both have zero copay

## 2020-09-18 NOTE — Anesthesia Procedure Notes (Signed)
Procedure Name: Intubation Date/Time: 09/18/2020 3:53 PM Performed by: Adalberto Ill, CRNA Pre-anesthesia Checklist: Patient identified, Emergency Drugs available, Suction available, Patient being monitored and Timeout performed Patient Re-evaluated:Patient Re-evaluated prior to induction Oxygen Delivery Method: Circle system utilized Preoxygenation: Pre-oxygenation with 100% oxygen Induction Type: IV induction Ventilation: Mask ventilation with difficulty Laryngoscope Size: Miller and 2 Grade View: Grade I Tube type: Oral Tube size: 6.5 mm Number of attempts: 1 (brief atraumatic ) Airway Equipment and Method: Stylet Placement Confirmation: ETT inserted through vocal cords under direct vision,  positive ETCO2 and breath sounds checked- equal and bilateral Secured at: 20 cm Tube secured with: Tape Dental Injury: Teeth and Oropharynx as per pre-operative assessment

## 2020-09-18 NOTE — Anesthesia Postprocedure Evaluation (Signed)
Anesthesia Post Note  Patient: Kelly Butler  Procedure(s) Performed: SHOULDER ARTHROSCOPY WITH ROTATOR CUFF REPAIR AND SUBACROMIAL DECOMPRESSION (Right )     Patient location during evaluation: PACU Anesthesia Type: General Level of consciousness: awake and alert Pain management: pain level controlled Vital Signs Assessment: post-procedure vital signs reviewed and stable Respiratory status: spontaneous breathing, nonlabored ventilation, respiratory function stable and patient connected to nasal cannula oxygen Cardiovascular status: blood pressure returned to baseline and stable Postop Assessment: no apparent nausea or vomiting Anesthetic complications: no   No complications documented.  Last Vitals:  Vitals:   09/18/20 1645 09/18/20 1700  BP: (!) 142/62 (!) 152/61  Pulse: 99 82  Resp: 19 (!) 23  Temp:    SpO2: 94% 95%    Last Pain:  Vitals:   09/18/20 1700  TempSrc:   PainSc: 7                  [name]

## 2020-09-18 NOTE — Anesthesia Procedure Notes (Signed)
   Anesthesia Regional Block: Interscalene brachial plexus block   Pre-Anesthetic Checklist: ,, timeout performed, Correct Patient, Correct Site, Correct Laterality, Correct Procedure, Correct Position, site marked, Risks and benefits discussed,  Surgical consent,  Pre-op evaluation,  At surgeon's request and post-op pain management  Laterality: Right  Prep: chloraprep       Needles:  Injection technique: Single-shot  Needle Type: Echogenic Stimulator Needle     Needle Length: 5cm  Needle Gauge: 22     Additional Needles:   Procedures:, nerve stimulator,,, ultrasound used (permanent image in chart),,,,   Nerve Stimulator or Paresthesia:  Response: hand, 0.45 mA,   Additional Responses:   Narrative:  Start time: 09/18/2020 2:20 PM End time: 09/18/2020 2:25 PM Injection made incrementally with aspirations every 5 mL.  Performed by: Personally  Anesthesiologist: Janeece Riggers, MD  Additional Notes: Functioning IV was confirmed and monitors were applied.  A 4mm 22ga Arrow echogenic stimulator needle was used. Sterile prep and drape,hand hygiene and sterile gloves were used. Ultrasound guidance: relevant anatomy identified, needle position confirmed, local anesthetic spread visualized around nerve(s)., vascular puncture avoided.  Image printed for medical record. Negative aspiration and negative test dose prior to incremental administration of local anesthetic. The patient tolerated the procedure well.
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
## 2020-09-18 NOTE — Progress Notes (Signed)
AssistedDr. Ambrose Pancoast with right, ultrasound guided, interscalene  block. Side rails up, monitors on throughout procedure. See vital signs in flow sheet. Tolerated Procedure well.

## 2020-09-18 NOTE — Transfer of Care (Signed)
Immediate Anesthesia Transfer of Care Note  Patient: Kelly Butler  Procedure(s) Performed: SHOULDER ARTHROSCOPY WITH ROTATOR CUFF REPAIR AND SUBACROMIAL DECOMPRESSION (Right )  Patient Location: PACU  Anesthesia Type:GA combined with regional for post-op pain  Level of Consciousness: awake, alert , oriented and patient cooperative  Airway & Oxygen Therapy: Patient Spontanous Breathing and Patient connected to face mask oxygen  Post-op Assessment: Report given to RN and Post -op Vital signs reviewed and stable  Post vital signs: Reviewed and stable  Last Vitals:  Vitals Value Taken Time  BP 142/62 09/18/20 1645  Temp 36.8 C 09/18/20 1643  Pulse 90 09/18/20 1648  Resp 24 09/18/20 1648  SpO2 97 % 09/18/20 1648  Vitals shown include unvalidated device data.  Last Pain:  Vitals:   09/18/20 1309  TempSrc: Oral  PainSc: 8          Complications: No complications documented.

## 2020-09-18 NOTE — Telephone Encounter (Addendum)
Rx for [employer] autoinjector pen sent to [facility] - patient will continue receiving [name] at [facility] on [address] [facility].  Medication will be couriered to site by [facility] and noted in prescription directions.  Knox Saliva, PharmD, MPH Clinical Pharmacist (Rheumatology and Pulmonology)

## 2020-09-18 NOTE — Addendum Note (Signed)
Addended by: Cassandria Anger on: 09/18/2020 02:12 PM   Modules accepted: Orders

## 2020-09-18 NOTE — Discharge Instructions (Signed)
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
## 2020-09-19 ENCOUNTER — Encounter (HOSPITAL_COMMUNITY): Payer: Self-pay | Admitting: Orthopedic Surgery

## 2020-09-19 ENCOUNTER — Telehealth: Payer: Self-pay | Admitting: Pharmacist

## 2020-09-19 NOTE — Telephone Encounter (Signed)
Called patient to schedule an appointment for the [employer] [facility]. I was unable to reach the patient so I left a HIPAA-compliant message requesting that the patient return my call.   Luke Van Ausdall, PharmD, BCACP, CPP Clinical Pharmacist [facility] [phone]  

## 2020-09-22 ENCOUNTER — Other Ambulatory Visit: Payer: Self-pay | Admitting: Obstetrics

## 2020-09-22 DIAGNOSIS — B3731 Acute candidiasis of vulva and vagina: Secondary | ICD-10-CM

## 2020-09-22 DIAGNOSIS — B9689 Other specified bacterial agents as the cause of diseases classified elsewhere: Secondary | ICD-10-CM

## 2020-09-22 DIAGNOSIS — N760 Acute vaginitis: Secondary | ICD-10-CM

## 2020-09-22 DIAGNOSIS — B373 Candidiasis of vulva and vagina: Secondary | ICD-10-CM

## 2020-09-25 ENCOUNTER — Ambulatory Visit (INDEPENDENT_AMBULATORY_CARE_PROVIDER_SITE_OTHER): Payer: BC Managed Care – PPO

## 2020-09-25 DIAGNOSIS — R002 Palpitations: Secondary | ICD-10-CM

## 2020-10-02 ENCOUNTER — Ambulatory Visit: Payer: BC Managed Care – PPO

## 2020-10-02 ENCOUNTER — Other Ambulatory Visit (HOSPITAL_COMMUNITY): Payer: Self-pay

## 2020-10-02 ENCOUNTER — Ambulatory Visit (INDEPENDENT_AMBULATORY_CARE_PROVIDER_SITE_OTHER): Payer: BC Managed Care – PPO

## 2020-10-02 ENCOUNTER — Other Ambulatory Visit: Payer: Self-pay

## 2020-10-02 VITALS — BP 152/87 | HR 82 | Temp 98.3°F | Resp 18

## 2020-10-02 DIAGNOSIS — J4550 Severe persistent asthma, uncomplicated: Secondary | ICD-10-CM | POA: Diagnosis not present

## 2020-10-02 MED ORDER — ALBUTEROL SULFATE HFA 108 (90 BASE) MCG/ACT IN AERS: 2.0000 | INHALATION_SPRAY | Freq: Once | RESPIRATORY_TRACT | Status: DC | PRN

## 2020-10-02 MED ORDER — EPINEPHRINE 0.3 MG/0.3ML IJ SOAJ: 0.3000 mg | Freq: Once | INTRAMUSCULAR | Status: DC | PRN

## 2020-10-02 MED ORDER — BENRALIZUMAB 30 MG/ML ~~LOC~~ SOSY
30.0000 mg | PREFILLED_SYRINGE | Freq: Once | SUBCUTANEOUS | Status: AC
  Administered 2020-10-02: 30 mg via SUBCUTANEOUS
  Filled 2020-10-02: qty 1

## 2020-10-02 MED ORDER — SODIUM CHLORIDE 0.9 % IV SOLN: Freq: Once | INTRAVENOUS | Status: DC | PRN

## 2020-10-02 MED ORDER — FAMOTIDINE IN NACL 20-0.9 MG/50ML-% IV SOLN: 20.0000 mg | Freq: Once | INTRAVENOUS | Status: DC | PRN

## 2020-10-02 MED ORDER — DIPHENHYDRAMINE HCL 50 MG/ML IJ SOLN: 50.0000 mg | Freq: Once | INTRAMUSCULAR | Status: DC | PRN

## 2020-10-02 MED ORDER — METHYLPREDNISOLONE SODIUM SUCC 125 MG IJ SOLR: 125.0000 mg | Freq: Once | INTRAMUSCULAR | Status: DC | PRN

## 2020-10-02 NOTE — Progress Notes (Addendum)
Diagnosis: Asthma  Provider:  Praveen Mannam, MD  Procedure: Injection  Fasenra (Benralizumab), Dose: 30 mg, Site: subcutaneous  Discharge: Condition: Good, Destination: Home . AVS provided to patient.   Performed by:  Aubriel Khanna, RN        

## 2020-10-03 DIAGNOSIS — Z419 Encounter for procedure for purposes other than remedying health state, unspecified: Secondary | ICD-10-CM | POA: Diagnosis not present

## 2020-10-10 ENCOUNTER — Other Ambulatory Visit: Payer: Self-pay | Admitting: Internal Medicine

## 2020-10-10 ENCOUNTER — Other Ambulatory Visit: Payer: Self-pay | Admitting: Obstetrics

## 2020-10-10 DIAGNOSIS — B3731 Acute candidiasis of vulva and vagina: Secondary | ICD-10-CM

## 2020-10-10 DIAGNOSIS — B373 Candidiasis of vulva and vagina: Secondary | ICD-10-CM

## 2020-10-12 NOTE — Telephone Encounter (Signed)
Received faxed refill request from pt called for refill  Medication name/strength/dose: zolpidem 10 mg Medication last rx'd: 04/11/20 Quantity and number of refills last rx'd: 30 with 5 refills Instructions: take 10 mg at bedtime as needed for sleep  Last OV: 09/15/2020 Next OV: no pending appts  CY please advise on refill request  Allergies  Allergen Reactions  . Celebrex [Celecoxib] Swelling and Rash  . Pregabalin Other (See Comments)    *LYRICA* REACTION: itching, swelling *LYRICA* REACTION: itching, swelling  . Sulfa Antibiotics Other (See Comments)   Current Outpatient Medications on File Prior to Visit  Medication Sig Dispense Refill  . albuterol (PROAIR HFA) 108 (90 Base) MCG/ACT inhaler TAKE 2 PUFFS EVERY 4 HOURS AS NEEDED -RESCUE 18 each 12  . ALPRAZolam (XANAX) 0.5 MG tablet Take 0.5 mg by mouth 3 (three) times daily as needed (anxiety). Anxiety.    . Benralizumab 30 MG/ML SOAJ INJECT 30 MG INTO THE SKIN EVERY 8 (EIGHT) WEEKS. DELIVERY TO [facility]: [address], [address], [place], [place] [address] 1 mL 2  . EPINEPHRINE 0.3 mg/0.3 mL IJ SOAJ injection INJECT INTO THE THIGH ONCE FOR SEVERE ALLERGIC REACTION. (Patient taking differently: Inject 0.3 mg into the muscle as needed for anaphylaxis. Inject into the thigh once for severe allergic reaction.) 2 each 5  . fluconazole (DIFLUCAN) 150 MG tablet TAKE 1 TABLET BY MOUTH AS ONE DOSE 1 tablet 0  . ipratropium-albuterol (DUONEB) 0.5-2.5 (3) MG/3ML SOLN USE 1 VIAL VIA NEBULIZER EVERY 4 HRS AS NEEDED (Patient taking differently: Inhale 3 mLs into the lungs every 4 (four) hours as needed (asthma). USE 1 VIAL VIA NEBULIZER EVERY 4 HRS AS NEEDED) 360 mL 2  . isosorbide mononitrate (IMDUR) 30 MG 24 hr tablet Take 30 mg by mouth daily.    . methocarbamol (ROBAXIN) 500 MG tablet Take 1 tablet (500 mg total) by mouth 2 (two) times daily. May cause drowsiness 20 tablet 0  . metoprolol tartrate (LOPRESSOR) 25 MG tablet Take 1  tablet (25 mg total) by mouth 2 (two) times daily. 180 tablet 3  . metroNIDAZOLE (METROGEL) 0.75 % vaginal gel PLACE 1 APPLICATORFUL VAGINALLY 2 (TWO) TIMES DAILY. 70 g 2  . montelukast (SINGULAIR) 10 MG tablet Take 1 tablet (10 mg total) by mouth daily. 90 tablet 4  . omeprazole (PRILOSEC) 40 MG capsule Take 40 mg by mouth 2 (two) times daily.    . ondansetron (ZOFRAN) 4 MG tablet Take 4 mg by mouth every 8 (eight) hours as needed for nausea or vomiting.   5  . oxyCODONE-acetaminophen (PERCOCET) 5-325 MG tablet Take 1-2 tablets every 4 hours as needed for post operative pain. MAX 6/day 30 tablet 0  . spironolactone (ALDACTONE) 25 MG tablet Take 1 tablet (25 mg total) by mouth daily. 90 tablet 3  . tiZANidine (ZANAFLEX) 4 MG tablet Take 1 tablet (4 mg total) by mouth every 8 (eight) hours as needed for muscle spasms. 30 tablet 1  . torsemide (DEMADEX) 20 MG tablet Take 1 tablet (20 mg total) by mouth 2 (two) times daily. 180 tablet 3  . umeclidinium-vilanterol (ANORO ELLIPTA) 62.5-25 MCG/INH AEPB TAKE 1 PUFF BY MOUTH EVERY DAY 60 each 12  . zolpidem (AMBIEN) 10 MG tablet ONE HALF OR 1 TAB AT BEDTIME FOR SLEEP AS NEEDED (Patient taking differently: Take 10 mg by mouth at bedtime as needed for sleep.) 30 tablet 5   No current facility-administered medications on file prior to visit.

## 2020-10-12 NOTE — Telephone Encounter (Signed)
Ambien refilled

## 2020-10-18 ENCOUNTER — Other Ambulatory Visit: Payer: Self-pay | Admitting: Obstetrics

## 2020-10-18 DIAGNOSIS — B3731 Acute candidiasis of vulva and vagina: Secondary | ICD-10-CM

## 2020-10-18 DIAGNOSIS — B373 Candidiasis of vulva and vagina: Secondary | ICD-10-CM

## 2020-10-23 ENCOUNTER — Telehealth: Payer: Self-pay | Admitting: Internal Medicine

## 2020-10-23 MED ORDER — PREDNISONE 20 MG PO TABS: 20.0000 mg | ORAL_TABLET | Freq: Every day | ORAL | 0 refills | Status: DC

## 2020-10-23 NOTE — Telephone Encounter (Signed)
Spoke with the pt-10/21/20- started with itchy throat, then started coughing and wheezing and having increased SOB  She states having some pressure in her chest this morning  Her cough is prod with thick, "dark white" sputum  She is still on her Anoro and singulair and has been using her Duoneb about 4 x per day  Covid vax done x 2 only Has not been tested She wants something called in Please advise, thanks!  Allergies  Allergen Reactions  . Celebrex [Celecoxib] Swelling and Rash  . Pregabalin Other (See Comments)    *LYRICA* REACTION: itching, swelling *LYRICA* REACTION: itching, swelling  . Sulfa Antibiotics Other (See Comments)   Current Outpatient Medications on File Prior to Visit  Medication Sig Dispense Refill  . albuterol (PROAIR HFA) 108 (90 Base) MCG/ACT inhaler TAKE 2 PUFFS EVERY 4 HOURS AS NEEDED -RESCUE 18 each 12  . ALPRAZolam (XANAX) 0.5 MG tablet Take 0.5 mg by mouth 3 (three) times daily as needed (anxiety). Anxiety.    . Benralizumab 30 MG/ML SOAJ INJECT 30 MG INTO THE SKIN EVERY 8 (EIGHT) WEEKS. DELIVERY TO  [facility]: [address], [address], [place], [place] [address] 1 mL 2  . EPINEPHRINE 0.3 mg/0.3 mL IJ SOAJ injection INJECT INTO THE THIGH ONCE FOR SEVERE ALLERGIC REACTION. (Patient taking differently: Inject 0.3 mg into the muscle as needed for anaphylaxis. Inject into the thigh once for severe allergic reaction.) 2 each 5  . fluconazole (DIFLUCAN) 150 MG tablet TAKE 1 TABLET BY MOUTH AS ONE DOSE 1 tablet 0  . ipratropium-albuterol (DUONEB) 0.5-2.5 (3) MG/3ML SOLN USE 1 VIAL VIA NEBULIZER EVERY 4 HRS AS NEEDED (Patient taking differently: Inhale 3 mLs into the lungs every 4 (four) hours as needed (asthma). USE 1 VIAL VIA NEBULIZER EVERY 4 HRS AS NEEDED) 360 mL 2  . isosorbide mononitrate (IMDUR) 30 MG 24 hr tablet Take 30 mg by mouth daily.    . methocarbamol (ROBAXIN) 500 MG tablet Take 1 tablet (500 mg total) by mouth 2 (two) times daily. May cause  drowsiness 20 tablet 0  . metoprolol tartrate (LOPRESSOR) 25 MG tablet Take 1 tablet (25 mg total) by mouth 2 (two) times daily. 180 tablet 3  . metroNIDAZOLE (METROGEL) 0.75 % vaginal gel PLACE 1 APPLICATORFUL VAGINALLY 2 (TWO) TIMES DAILY. 70 g 2  . montelukast (SINGULAIR) 10 MG tablet Take 1 tablet (10 mg total) by mouth daily. 90 tablet 4  . omeprazole (PRILOSEC) 40 MG capsule Take 40 mg by mouth 2 (two) times daily.    . ondansetron (ZOFRAN) 4 MG tablet Take 4 mg by mouth every 8 (eight) hours as needed for nausea or vomiting.   5  . oxyCODONE-acetaminophen (PERCOCET) 5-325 MG tablet Take 1-2 tablets every 4 hours as needed for post operative pain. MAX 6/day 30 tablet 0  . spironolactone (ALDACTONE) 25 MG tablet Take 1 tablet (25 mg total) by mouth daily. 90 tablet 3  . tiZANidine (ZANAFLEX) 4 MG tablet Take 1 tablet (4 mg total) by mouth every 8 (eight) hours as needed for muscle spasms. 30 tablet 1  . torsemide (DEMADEX) 20 MG tablet Take 1 tablet (20 mg total) by mouth 2 (two) times daily. 180 tablet 3  . umeclidinium-vilanterol (ANORO ELLIPTA) 62.5-25 MCG/INH AEPB TAKE 1 PUFF BY MOUTH EVERY DAY 60 each 12  . zolpidem (AMBIEN) 10 MG tablet TAKE ONE HALF OR 1 TAB AT BEDTIME FOR SLEEP AS NEEDED 30 tablet 5   No current facility-administered medications on  file prior to visit.

## 2020-10-23 NOTE — Telephone Encounter (Signed)
Most likely this is pollen allergy. Recommend- prednisone 20 mg, # 5, 1 daily x 5 days..  Can add otc antihistamine line claritin and nasal spray like flonase as needed. Worst of pollen should be fading in next 2-3 weeks.

## 2020-10-23 NOTE — Telephone Encounter (Signed)
Spoke with the pt and notified of response per Dr [name]  She verbalized understanding  Rx sent to pharm  She will call if not improving

## 2020-10-29 ENCOUNTER — Telehealth: Payer: Self-pay | Admitting: Internal Medicine

## 2020-10-29 MED ORDER — PREDNISONE 10 MG PO TABS: 10.0000 mg | ORAL_TABLET | Freq: Every day | ORAL | 0 refills | Status: DC

## 2020-10-29 NOTE — Telephone Encounter (Signed)
Let's continue prednisone 10 mg, 1 daily, # 14, then stop

## 2020-10-29 NOTE — Telephone Encounter (Signed)
Spoke with the pt and notified of response per Dr [name]. She verbalized understanding and I have sent rx to pharm. Pt advised to call if not improving.

## 2020-10-29 NOTE — Telephone Encounter (Signed)
Spoke with the pt  She had called on 10/23/20 with cough, wheezing, SOB  We sent her in pred and and she states this helped temporarily  Now that the pred is done she is c/o increased wheezing, chest tightness, cough with yellow sputum and increased DOE  She states that she does not have any f/c/s, body aches  She is still taking her Anoro, albuterol inhaler and neb  She states that she never started on flonase or claritin like we rec last phone call, bc they have never helped in the past  Please advise, thanks!  Allergies  Allergen Reactions  . Celebrex [Celecoxib] Swelling and Rash  . Pregabalin Other (See Comments)    *LYRICA* REACTION: itching, swelling *LYRICA* REACTION: itching, swelling  . Sulfa Antibiotics Other (See Comments)   Current Outpatient Medications on File Prior to Visit  Medication Sig Dispense Refill  . albuterol (PROAIR HFA) 108 (90 Base) MCG/ACT inhaler TAKE 2 PUFFS EVERY 4 HOURS AS NEEDED -RESCUE 18 each 12  . ALPRAZolam (XANAX) 0.5 MG tablet Take 0.5 mg by mouth 3 (three) times daily as needed (anxiety). Anxiety.    . Benralizumab 30 MG/ML SOAJ INJECT 30 MG INTO THE SKIN EVERY 8 (EIGHT) WEEKS. DELIVERY TO [facility]: [address], [address], [place], [place] [address] 1 mL 2  . EPINEPHRINE 0.3 mg/0.3 mL IJ SOAJ injection INJECT INTO THE THIGH ONCE FOR SEVERE ALLERGIC REACTION. (Patient taking differently: Inject 0.3 mg into the muscle as needed for anaphylaxis. Inject into the thigh once for severe allergic reaction.) 2 each 5  . fluconazole (DIFLUCAN) 150 MG tablet TAKE 1 TABLET BY MOUTH AS ONE DOSE 1 tablet 0  . ipratropium-albuterol (DUONEB) 0.5-2.5 (3) MG/3ML SOLN USE 1 VIAL VIA NEBULIZER EVERY 4 HRS AS NEEDED (Patient taking differently: Inhale 3 mLs into the lungs every 4 (four) hours as needed (asthma). USE 1 VIAL VIA NEBULIZER EVERY 4 HRS AS NEEDED) 360 mL 2  . isosorbide mononitrate (IMDUR) 30 MG 24 hr tablet Take 30 mg by mouth daily.    .  methocarbamol (ROBAXIN) 500 MG tablet Take 1 tablet (500 mg total) by mouth 2 (two) times daily. May cause drowsiness 20 tablet 0  . metoprolol tartrate (LOPRESSOR) 25 MG tablet Take 1 tablet (25 mg total) by mouth 2 (two) times daily. 180 tablet 3  . metroNIDAZOLE (METROGEL) 0.75 % vaginal gel PLACE 1 APPLICATORFUL VAGINALLY 2 (TWO) TIMES DAILY. 70 g 2  . montelukast (SINGULAIR) 10 MG tablet Take 1 tablet (10 mg total) by mouth daily. 90 tablet 4  . omeprazole (PRILOSEC) 40 MG capsule Take 40 mg by mouth 2 (two) times daily.    . ondansetron (ZOFRAN) 4 MG tablet Take 4 mg by mouth every 8 (eight) hours as needed for nausea or vomiting.   5  . oxyCODONE-acetaminophen (PERCOCET) 5-325 MG tablet Take 1-2 tablets every 4 hours as needed for post operative pain. MAX 6/day 30 tablet 0  . predniSONE (DELTASONE) 20 MG tablet Take 1 tablet (20 mg total) by mouth daily with breakfast. 5 tablet 0  . spironolactone (ALDACTONE) 25 MG tablet Take 1 tablet (25 mg total) by mouth daily. 90 tablet 3  . tiZANidine (ZANAFLEX) 4 MG tablet Take 1 tablet (4 mg total) by mouth every 8 (eight) hours as needed for muscle spasms. 30 tablet 1  . torsemide (DEMADEX) 20 MG tablet Take 1 tablet (20 mg total) by mouth 2 (two) times daily. 180 tablet 3  . umeclidinium-vilanterol (  ANORO ELLIPTA) 62.5-25 MCG/INH AEPB TAKE 1 PUFF BY MOUTH EVERY DAY 60 each 12  . zolpidem (AMBIEN) 10 MG tablet TAKE ONE HALF OR 1 TAB AT BEDTIME FOR SLEEP AS NEEDED 30 tablet 5   No current facility-administered medications on file prior to visit.

## 2020-11-02 DIAGNOSIS — Z419 Encounter for procedure for purposes other than remedying health state, unspecified: Secondary | ICD-10-CM | POA: Diagnosis not present

## 2020-11-09 ENCOUNTER — Other Ambulatory Visit: Payer: Self-pay | Admitting: Internal Medicine

## 2020-11-12 ENCOUNTER — Other Ambulatory Visit (HOSPITAL_COMMUNITY): Payer: Self-pay

## 2020-11-12 MED FILL — Benralizumab Subcutaneous Soln Auto-injector 30 MG/ML: SUBCUTANEOUS | 56 days supply | Qty: 1 | Fill #0 | Status: AC

## 2020-11-22 ENCOUNTER — Other Ambulatory Visit (HOSPITAL_COMMUNITY): Payer: Self-pay

## 2020-11-25 ENCOUNTER — Encounter: Payer: Self-pay | Admitting: Internal Medicine

## 2020-11-27 ENCOUNTER — Other Ambulatory Visit: Payer: Self-pay

## 2020-11-27 ENCOUNTER — Ambulatory Visit (INDEPENDENT_AMBULATORY_CARE_PROVIDER_SITE_OTHER): Payer: BC Managed Care – PPO

## 2020-11-27 VITALS — BP 156/81 | HR 103 | Temp 98.6°F

## 2020-11-27 DIAGNOSIS — J4550 Severe persistent asthma, uncomplicated: Secondary | ICD-10-CM

## 2020-11-27 MED ORDER — EPINEPHRINE 0.3 MG/0.3ML IJ SOAJ: 0.3000 mg | Freq: Once | INTRAMUSCULAR | Status: DC | PRN

## 2020-11-27 MED ORDER — ALBUTEROL SULFATE HFA 108 (90 BASE) MCG/ACT IN AERS: 2.0000 | INHALATION_SPRAY | Freq: Once | RESPIRATORY_TRACT | Status: DC | PRN

## 2020-11-27 MED ORDER — DIPHENHYDRAMINE HCL 50 MG/ML IJ SOLN: 50.0000 mg | Freq: Once | INTRAMUSCULAR | Status: DC | PRN

## 2020-11-27 MED ORDER — BENRALIZUMAB 30 MG/ML ~~LOC~~ SOSY
30.0000 mg | PREFILLED_SYRINGE | Freq: Once | SUBCUTANEOUS | Status: AC
  Administered 2020-11-27: 30 mg via SUBCUTANEOUS
  Filled 2020-11-27: qty 1

## 2020-11-27 MED ORDER — METHYLPREDNISOLONE SODIUM SUCC 125 MG IJ SOLR: 125.0000 mg | Freq: Once | INTRAMUSCULAR | Status: DC | PRN

## 2020-11-27 MED ORDER — FAMOTIDINE IN NACL 20-0.9 MG/50ML-% IV SOLN: 20.0000 mg | Freq: Once | INTRAVENOUS | Status: DC | PRN

## 2020-11-27 MED ORDER — SODIUM CHLORIDE 0.9 % IV SOLN: Freq: Once | INTRAVENOUS | Status: DC | PRN

## 2020-11-27 NOTE — Progress Notes (Signed)
Diagnosis: Asthma  Provider:  Marshell Garfinkel, MD  Procedure: Injection  Fasenra (Benralizumab), Dose: 30 mg, Site: subcutaneous  Discharge: Condition: Good, Destination: Home . AVS provided to patient.   Performed by:  Koren Shiver, RN

## 2020-12-03 DIAGNOSIS — Z419 Encounter for procedure for purposes other than remedying health state, unspecified: Secondary | ICD-10-CM | POA: Diagnosis not present

## 2020-12-04 ENCOUNTER — Other Ambulatory Visit: Payer: Self-pay | Admitting: Obstetrics

## 2020-12-04 DIAGNOSIS — B3731 Acute candidiasis of vulva and vagina: Secondary | ICD-10-CM

## 2020-12-04 DIAGNOSIS — B373 Candidiasis of vulva and vagina: Secondary | ICD-10-CM

## 2020-12-04 NOTE — Telephone Encounter (Signed)
Refill request from pharmacy:     Requested Prescriptions   Name from pharmacy: FLUCONAZOLE 150 MG TABLET       Will file in chart as: fluconazole (DIFLUCAN) 150 MG tablet   Sig: TAKE 1 TABLET BY MOUTH AS ONE DOSE   Disp:  1 tablet    Refills:  0 (Pharmacy requested: Not specified)   Start: 12/04/2020   Class: Normal   Non-formulary For: Candida vaginitis   Last ordered: 1 month ago by Shelly Bombard, MD Last refill: 10/19/2020   Rx #: [id number]     To be filled at: [id number] - [place], [place]

## 2020-12-08 ENCOUNTER — Telehealth: Payer: Self-pay | Admitting: Internal Medicine

## 2020-12-08 ENCOUNTER — Encounter: Payer: Self-pay | Admitting: Internal Medicine

## 2020-12-08 ENCOUNTER — Other Ambulatory Visit (HOSPITAL_COMMUNITY): Payer: BC Managed Care – PPO

## 2020-12-08 NOTE — Telephone Encounter (Signed)
Called patient twice, she did not answer. Left message for patient to call back.

## 2020-12-10 ENCOUNTER — Other Ambulatory Visit (HOSPITAL_COMMUNITY): Payer: Self-pay

## 2020-12-10 NOTE — Telephone Encounter (Signed)
Lmtcb for pt.  

## 2020-12-11 MED ORDER — PREDNISONE 10 MG PO TABS: ORAL_TABLET | ORAL | 0 refills | Status: AC

## 2020-12-11 NOTE — Telephone Encounter (Signed)
Called and spoke to pt. Pt c/o increase in SOB, prod cough with "off white" color, chest tightness, sore throat, wheezing x 1 week. Pt denies f/c/s and swelling. Pt states she is no longer taking Claritin and states she didn't feel like it helped. However, pt feels her s/s are much worse when she goes outside. Pt also is requesting a portable neb machine.    Dr. [name] please advise on pt's s/s and if ok to order a portable neb machine. Thanks.   Allergies  Allergen Reactions   Celebrex [Celecoxib] Swelling and Rash   Pregabalin Other (See Comments)    *LYRICA* REACTION: itching, swelling *LYRICA* REACTION: itching, swelling   Sulfa Antibiotics Other (See Comments)    Current Outpatient Medications on File Prior to Visit  Medication Sig Dispense Refill   albuterol (PROAIR HFA) 108 (90 Base) MCG/ACT inhaler TAKE 2 PUFFS EVERY 4 HOURS AS NEEDED -RESCUE 18 each 12   ALPRAZolam (XANAX) 0.5 MG tablet Take 0.5 mg by mouth 3 (three) times daily as needed (anxiety). Anxiety.     Benralizumab 30 MG/ML SOAJ INJECT 30 MG INTO THE SKIN EVERY 8 (EIGHT) WEEKS. DELIVERY TO [facility]: [address], [address], [place], [place] [address] 1 mL 2   EPINEPHRINE 0.3 mg/0.3 mL IJ SOAJ injection INJECT INTO THE THIGH ONCE FOR SEVERE ALLERGIC REACTION. (Patient taking differently: Inject 0.3 mg into the muscle as needed for anaphylaxis. Inject into the thigh once for severe allergic reaction.) 2 each 5   fluconazole (DIFLUCAN) 150 MG tablet TAKE 1 TABLET BY MOUTH AS ONE DOSE 1 tablet 0   ipratropium-albuterol (DUONEB) 0.5-2.5 (3) MG/3ML SOLN Inhale 3 mLs into the lungs every 4 (four) hours as needed (asthma). USE 1 VIAL VIA NEBULIZER EVERY 4 HRS AS NEEDED 360 mL 1   isosorbide mononitrate (IMDUR) 30 MG 24 hr tablet Take 30 mg by mouth daily.     methocarbamol (ROBAXIN) 500 MG tablet Take 1 tablet (500 mg total) by mouth 2 (two) times daily. May cause drowsiness 20 tablet 0   metoprolol tartrate (LOPRESSOR)  25 MG tablet Take 1 tablet (25 mg total) by mouth 2 (two) times daily. 180 tablet 3   metroNIDAZOLE (METROGEL) 0.75 % vaginal gel PLACE 1 APPLICATORFUL VAGINALLY 2 (TWO) TIMES DAILY. 70 g 2   montelukast (SINGULAIR) 10 MG tablet Take 1 tablet (10 mg total) by mouth daily. 90 tablet 4   omeprazole (PRILOSEC) 40 MG capsule Take 40 mg by mouth 2 (two) times daily.     ondansetron (ZOFRAN) 4 MG tablet Take 4 mg by mouth every 8 (eight) hours as needed for nausea or vomiting.   5   oxyCODONE-acetaminophen (PERCOCET) 5-325 MG tablet Take 1-2 tablets every 4 hours as needed for post operative pain. MAX 6/day 30 tablet 0   predniSONE (DELTASONE) 10 MG tablet Take 1 tablet (10 mg total) by mouth daily with breakfast. 14 tablet 0   spironolactone (ALDACTONE) 25 MG tablet Take 1 tablet (25 mg total) by mouth daily. 90 tablet 3   tiZANidine (ZANAFLEX) 4 MG tablet Take 1 tablet (4 mg total) by mouth every 8 (eight) hours as needed for muscle spasms. 30 tablet 1   torsemide (DEMADEX) 20 MG tablet Take 1 tablet (20 mg total) by mouth 2 (two) times daily. 180 tablet 3   umeclidinium-vilanterol (ANORO ELLIPTA) 62.5-25 MCG/INH AEPB TAKE 1 PUFF BY MOUTH EVERY DAY 60 each 12   zolpidem (AMBIEN) 10 MG tablet TAKE ONE HALF OR  1 TAB AT BEDTIME FOR SLEEP AS NEEDED 30 tablet 5   No current facility-administered medications on file prior to visit.

## 2020-12-11 NOTE — Telephone Encounter (Signed)
Please advise if any recs for pt's acute s/s. Thank you!

## 2020-12-11 NOTE — Telephone Encounter (Signed)
Deneise Lever, MD to Collier Salina, RN      4:22 PM  I know she doesn't like prednisone, but she already has the other tools.  Recommend prednisone  10 mg    # 10    2 dailt x 3 days, then one daily    Called and spoke with pt letting her know the recs stated by CY and she verbalized understanding and was fine with prednisone being sent to pharmacy for her. Verified preferred pharmacy and sent Rx for pt. Nothing further needed.

## 2020-12-15 ENCOUNTER — Telehealth: Payer: Self-pay | Admitting: Pharmacy Technician

## 2020-12-15 NOTE — Telephone Encounter (Signed)
Auth Submission: no auth required Payer: [employer]/MEDICAID Medication & CPT/J Code(s) submitted: [name] Public house manager) [phone] Route of submission (phone, fax, portal): PORTAL - [email or web address] Auth type: Pharmacy Benefit Units/visits requested: 6 MONTHS TRACKING Reference number:  NO AUTH IS REQUIRED.  BIV REF# [id number]

## 2020-12-22 ENCOUNTER — Telehealth: Payer: Medicaid Other | Admitting: Cardiovascular Disease

## 2020-12-24 ENCOUNTER — Other Ambulatory Visit (HOSPITAL_COMMUNITY): Payer: BC Managed Care – PPO

## 2020-12-25 ENCOUNTER — Other Ambulatory Visit: Payer: Self-pay

## 2020-12-25 DIAGNOSIS — I420 Dilated cardiomyopathy: Secondary | ICD-10-CM

## 2020-12-25 NOTE — Progress Notes (Signed)
Reordered echo, since previous ordered expired.

## 2021-01-02 DIAGNOSIS — Z419 Encounter for procedure for purposes other than remedying health state, unspecified: Secondary | ICD-10-CM | POA: Diagnosis not present

## 2021-01-13 ENCOUNTER — Other Ambulatory Visit (HOSPITAL_COMMUNITY): Payer: Self-pay

## 2021-01-13 MED FILL — Benralizumab Subcutaneous Soln Auto-injector 30 MG/ML: SUBCUTANEOUS | 56 days supply | Qty: 1 | Fill #1 | Status: CN

## 2021-01-17 ENCOUNTER — Other Ambulatory Visit: Payer: Self-pay | Admitting: Internal Medicine

## 2021-01-17 ENCOUNTER — Other Ambulatory Visit: Payer: Self-pay | Admitting: Cardiovascular Disease

## 2021-01-19 ENCOUNTER — Encounter (HOSPITAL_COMMUNITY): Payer: Self-pay

## 2021-01-19 ENCOUNTER — Telehealth: Payer: Self-pay

## 2021-01-19 ENCOUNTER — Other Ambulatory Visit (HOSPITAL_COMMUNITY): Payer: Self-pay

## 2021-01-19 ENCOUNTER — Other Ambulatory Visit (HOSPITAL_COMMUNITY): Payer: Medicaid Other

## 2021-01-19 MED FILL — Benralizumab Subcutaneous Soln Auto-injector 30 MG/ML: SUBCUTANEOUS | 56 days supply | Qty: 1 | Fill #1 | Status: AC

## 2021-01-19 NOTE — Telephone Encounter (Signed)
Submitted a Prior Authorization request to [facility] for [facility] via CoverMyMeds. Will update once we receive a response.   Key: [id number]

## 2021-01-19 NOTE — Telephone Encounter (Signed)
Received notification from [facility] regarding a prior authorization for [facility]. Authorization has been APPROVED from 01/19/2021 to 01/19/2022.   Patient can continue to fill through [facility]: [phone]   Authorization # [id number]

## 2021-01-22 ENCOUNTER — Ambulatory Visit: Payer: Medicaid Other

## 2021-01-27 DIAGNOSIS — F332 Major depressive disorder, recurrent severe without psychotic features: Secondary | ICD-10-CM | POA: Diagnosis not present

## 2021-01-27 DIAGNOSIS — F5101 Primary insomnia: Secondary | ICD-10-CM | POA: Diagnosis not present

## 2021-01-27 DIAGNOSIS — F4310 Post-traumatic stress disorder, unspecified: Secondary | ICD-10-CM | POA: Diagnosis not present

## 2021-01-29 ENCOUNTER — Ambulatory Visit (INDEPENDENT_AMBULATORY_CARE_PROVIDER_SITE_OTHER): Payer: Medicaid Other

## 2021-01-29 ENCOUNTER — Other Ambulatory Visit: Payer: Self-pay

## 2021-01-29 VITALS — BP 141/78 | HR 93 | Temp 98.6°F | Resp 16

## 2021-01-29 DIAGNOSIS — J4550 Severe persistent asthma, uncomplicated: Secondary | ICD-10-CM

## 2021-01-29 MED ORDER — SODIUM CHLORIDE 0.9 % IV SOLN: Freq: Once | INTRAVENOUS | Status: DC | PRN

## 2021-01-29 MED ORDER — FAMOTIDINE IN NACL 20-0.9 MG/50ML-% IV SOLN: 20.0000 mg | Freq: Once | INTRAVENOUS | Status: DC | PRN

## 2021-01-29 MED ORDER — ALBUTEROL SULFATE HFA 108 (90 BASE) MCG/ACT IN AERS: 2.0000 | INHALATION_SPRAY | Freq: Once | RESPIRATORY_TRACT | Status: DC | PRN

## 2021-01-29 MED ORDER — DIPHENHYDRAMINE HCL 50 MG/ML IJ SOLN: 50.0000 mg | Freq: Once | INTRAMUSCULAR | Status: DC | PRN

## 2021-01-29 MED ORDER — METHYLPREDNISOLONE SODIUM SUCC 125 MG IJ SOLR: 125.0000 mg | Freq: Once | INTRAMUSCULAR | Status: DC | PRN

## 2021-01-29 MED ORDER — BENRALIZUMAB 30 MG/ML ~~LOC~~ SOSY
30.0000 mg | PREFILLED_SYRINGE | Freq: Once | SUBCUTANEOUS | Status: AC
  Administered 2021-01-29: 30 mg via SUBCUTANEOUS
  Filled 2021-01-29: qty 1

## 2021-01-29 MED ORDER — EPINEPHRINE 0.3 MG/0.3ML IJ SOAJ: 0.3000 mg | Freq: Once | INTRAMUSCULAR | Status: DC | PRN

## 2021-01-29 NOTE — Progress Notes (Cosign Needed)
Diagnosis: Asthma  Provider:  Marshell Garfinkel, MD  Procedure: Injection  Fasenra (Benralizumab), Dose: 30 mg, Site: subcutaneous left arm  Discharge: Condition: Good, Destination: Home . AVS provided to patient.   Performed by:  Maimuna Leaman, Sherlon Handing, LPN

## 2021-02-02 DIAGNOSIS — Z419 Encounter for procedure for purposes other than remedying health state, unspecified: Secondary | ICD-10-CM | POA: Diagnosis not present

## 2021-02-03 ENCOUNTER — Other Ambulatory Visit (HOSPITAL_COMMUNITY): Payer: Medicaid Other

## 2021-02-06 ENCOUNTER — Other Ambulatory Visit: Payer: Self-pay | Admitting: Cardiovascular Disease

## 2021-02-10 ENCOUNTER — Other Ambulatory Visit (HOSPITAL_COMMUNITY): Payer: Medicaid Other

## 2021-02-18 ENCOUNTER — Encounter: Payer: Self-pay | Admitting: Obstetrics

## 2021-02-18 ENCOUNTER — Other Ambulatory Visit: Payer: Self-pay | Admitting: Internal Medicine

## 2021-02-18 ENCOUNTER — Other Ambulatory Visit: Payer: Self-pay

## 2021-02-18 ENCOUNTER — Other Ambulatory Visit (HOSPITAL_COMMUNITY)
Admission: RE | Admit: 2021-02-18 | Discharge: 2021-02-18 | Disposition: A | Discharge status: Home / Self Care | Payer: Medicaid Other | Source: Ambulatory Visit | Attending: Obstetrics | Admitting: Obstetrics

## 2021-02-18 ENCOUNTER — Other Ambulatory Visit: Payer: Self-pay | Admitting: Physician Assistant

## 2021-02-18 ENCOUNTER — Ambulatory Visit (INDEPENDENT_AMBULATORY_CARE_PROVIDER_SITE_OTHER): Payer: Medicaid Other | Admitting: Obstetrics

## 2021-02-18 VITALS — BP 164/88 | HR 101 | Ht 60.0 in | Wt 183.5 lb

## 2021-02-18 DIAGNOSIS — R52 Pain, unspecified: Secondary | ICD-10-CM | POA: Diagnosis not present

## 2021-02-18 DIAGNOSIS — Z01419 Encounter for gynecological examination (general) (routine) without abnormal findings: Secondary | ICD-10-CM

## 2021-02-18 DIAGNOSIS — R300 Dysuria: Secondary | ICD-10-CM

## 2021-02-18 DIAGNOSIS — E669 Obesity, unspecified: Secondary | ICD-10-CM

## 2021-02-18 DIAGNOSIS — N898 Other specified noninflammatory disorders of vagina: Secondary | ICD-10-CM

## 2021-02-18 DIAGNOSIS — B356 Tinea cruris: Secondary | ICD-10-CM

## 2021-02-18 DIAGNOSIS — Z1231 Encounter for screening mammogram for malignant neoplasm of breast: Secondary | ICD-10-CM

## 2021-02-18 DIAGNOSIS — Z113 Encounter for screening for infections with a predominantly sexual mode of transmission: Secondary | ICD-10-CM | POA: Diagnosis not present

## 2021-02-18 LAB — POCT URINALYSIS DIPSTICK
Bilirubin, UA: NEGATIVE
Glucose, UA: NEGATIVE
Ketones, UA: NEGATIVE
Leukocytes, UA: NEGATIVE
Nitrite, UA: NEGATIVE
Protein, UA: POSITIVE — AB
Spec Grav, UA: 1.01 (ref 1.010–1.025)
Urobilinogen, UA: 0.2 E.U./dL
pH, UA: 6 (ref 5.0–8.0)

## 2021-02-18 MED ORDER — IBUPROFEN 800 MG PO TABS: 800.0000 mg | ORAL_TABLET | Freq: Three times a day (TID) | ORAL | 5 refills | Status: DC | PRN

## 2021-02-18 MED ORDER — CLOTRIMAZOLE-BETAMETHASONE 1-0.05 % EX CREA: 1.0000 "application " | TOPICAL_CREAM | Freq: Two times a day (BID) | CUTANEOUS | 1 refills | Status: DC
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
## 2021-02-18 NOTE — Progress Notes (Signed)
Pt presents for annual, pap, and all STD bloodwork. She c/o pruritus vaginal rash and thick discharge. Mammogram completed 04/24/20 Colonoscopy UTD per pt - unsure last screening.

## 2021-02-19 DIAGNOSIS — H401131 Primary open-angle glaucoma, bilateral, mild stage: Secondary | ICD-10-CM | POA: Diagnosis not present

## 2021-02-19 LAB — CERVICOVAGINAL ANCILLARY ONLY
Bacterial Vaginitis (gardnerella): NEGATIVE
Candida Glabrata: POSITIVE — AB
Candida Vaginitis: POSITIVE — AB
Chlamydia: NEGATIVE
Comment: NEGATIVE
Comment: NEGATIVE
Comment: NEGATIVE
Comment: NEGATIVE
Comment: NEGATIVE
Comment: NORMAL
Neisseria Gonorrhea: NEGATIVE
Trichomonas: NEGATIVE

## 2021-02-19 LAB — HEPATITIS B SURFACE ANTIGEN: Hepatitis B Surface Ag: NEGATIVE

## 2021-02-19 LAB — CYTOLOGY - PAP
Comment: NEGATIVE
Diagnosis: NEGATIVE
High risk HPV: NEGATIVE

## 2021-02-19 LAB — HEPATITIS C ANTIBODY: Hep C Virus Ab: <0.1 s/co ratio (ref 0.0–0.9)

## 2021-02-19 LAB — RPR: RPR Ser Ql: NONREACTIVE

## 2021-02-19 LAB — HIV ANTIBODY (ROUTINE TESTING W REFLEX): HIV Screen 4th Generation wRfx: NONREACTIVE

## 2021-02-20 LAB — URINE CULTURE

## 2021-02-23 ENCOUNTER — Other Ambulatory Visit: Payer: Self-pay | Admitting: Obstetrics

## 2021-02-23 ENCOUNTER — Telehealth: Payer: Self-pay

## 2021-02-23 DIAGNOSIS — B373 Candidiasis of vulva and vagina: Secondary | ICD-10-CM

## 2021-02-23 DIAGNOSIS — B3731 Acute candidiasis of vulva and vagina: Secondary | ICD-10-CM

## 2021-02-23 MED ORDER — AZO BORIC ACID 600 MG VA SUPP: 1.0000 | Freq: Every evening | VAGINAL | 2 refills | Status: DC

## 2021-02-23 NOTE — Telephone Encounter (Signed)
Rx for Boric Acid supp is not covered by pt ins. Pt states she is not able to get Rx please advise.

## 2021-02-25 ENCOUNTER — Other Ambulatory Visit: Payer: Self-pay | Admitting: Internal Medicine

## 2021-02-25 ENCOUNTER — Other Ambulatory Visit: Payer: Self-pay | Admitting: Obstetrics

## 2021-02-25 DIAGNOSIS — B3731 Acute candidiasis of vulva and vagina: Secondary | ICD-10-CM

## 2021-02-25 DIAGNOSIS — B373 Candidiasis of vulva and vagina: Secondary | ICD-10-CM

## 2021-02-25 NOTE — Telephone Encounter (Signed)
Patient requesting refill for Ambien. Last office visit 09/15/20. Last refill 10/12/20 x 5 RF. Is it ok to send in?  Dr. [name] please advise  Thank you

## 2021-02-26 ENCOUNTER — Ambulatory Visit (HOSPITAL_COMMUNITY): Payer: Medicaid Other | Attending: Cardiovascular Disease

## 2021-02-26 ENCOUNTER — Other Ambulatory Visit: Payer: Self-pay

## 2021-02-26 DIAGNOSIS — I420 Dilated cardiomyopathy: Secondary | ICD-10-CM | POA: Insufficient documentation

## 2021-02-26 LAB — ECHOCARDIOGRAM COMPLETE
AR max vel: 1.57 cm2
AV Area VTI: 1.56 cm2
AV Area mean vel: 1.47 cm2
AV Mean grad: 8 mmHg
AV Peak grad: 13.9 mmHg
Ao pk vel: 1.86 m/s
Area-P 1/2: 4.49 cm2
P 1/2 time: 371 msec
S' Lateral: 3.5 cm

## 2021-02-26 MED ORDER — PERFLUTREN LIPID MICROSPHERE
1.0000 mL | INTRAVENOUS | Status: AC | PRN
  Administered 2021-02-26: 2 mL via INTRAVENOUS

## 2021-02-26 NOTE — Telephone Encounter (Signed)
Ambien refilled

## 2021-02-27 NOTE — Progress Notes (Signed)
Pt has been made aware of normal result and verbalized understanding.  jw

## 2021-03-02 NOTE — Progress Notes (Signed)
Diagnosis: Asthma  Provider:  Marshell Garfinkel, MD  Procedure: Injection  Fasenra (Benralizumab), Dose: 30 mg, Site: subcutaneous left arm  Discharge: Condition: Good, Destination: Home . AVS provided to patient.   Performed by:  Durward Parcel LPN

## 2021-03-04 ENCOUNTER — Other Ambulatory Visit: Payer: Self-pay | Admitting: Obstetrics

## 2021-03-04 ENCOUNTER — Telehealth: Payer: Self-pay | Admitting: *Deleted

## 2021-03-04 NOTE — Telephone Encounter (Signed)
Returned TC regarding RX denied and need for new RX. No answer. Left message for patient to call the office for clarification of whether prior authorization is needed for original RX or if she would prefer a different RX.

## 2021-03-05 DIAGNOSIS — M25562 Pain in left knee: Secondary | ICD-10-CM | POA: Diagnosis not present

## 2021-03-05 DIAGNOSIS — Z96651 Presence of right artificial knee joint: Secondary | ICD-10-CM | POA: Diagnosis not present

## 2021-03-05 DIAGNOSIS — M25561 Pain in right knee: Secondary | ICD-10-CM | POA: Diagnosis not present

## 2021-03-05 DIAGNOSIS — Z419 Encounter for procedure for purposes other than remedying health state, unspecified: Secondary | ICD-10-CM | POA: Diagnosis not present

## 2021-03-05 DIAGNOSIS — M79641 Pain in right hand: Secondary | ICD-10-CM | POA: Diagnosis not present

## 2021-03-05 DIAGNOSIS — G8929 Other chronic pain: Secondary | ICD-10-CM | POA: Diagnosis not present

## 2021-03-09 ENCOUNTER — Other Ambulatory Visit: Payer: Self-pay | Admitting: Obstetrics

## 2021-03-09 DIAGNOSIS — B373 Candidiasis of vulva and vagina: Secondary | ICD-10-CM

## 2021-03-09 DIAGNOSIS — B3731 Acute candidiasis of vulva and vagina: Secondary | ICD-10-CM

## 2021-03-09 NOTE — Progress Notes (Signed)
Virtual Visit via Video Note   This visit type was conducted due to national recommendations for restrictions regarding the COVID-19 Pandemic (e.g. social distancing) in an effort to limit this patient's exposure and mitigate transmission in our community.  Due to her co-morbid illnesses, this patient is at least at moderate risk for complications without adequate follow up.  This format is felt to be most appropriate for this patient at this time.  All issues noted in this document were discussed and addressed.  A limited physical exam was performed with this format.  Please refer to the patient's chart for her consent to telehealth for [facility].   Patient Location: Home Physician Location: Office    Date:  03/12/2021   ID:  Kelly Butler, DOB May 04, 1961, MRN [id number]  PCP:  Beal, Sheri, [place]  Cardiologist:  Jenkins Rouge, MD   Advanced Practice Provider:  No care team member to display      Patient Profile:    Kelly Butler is a 60 y.o. female with:  HFimpEF (heart failure with improved ejection fraction)  Non-ischemic cardiomyopathy  EF 2018: 40-45 Echocardiogram 07/2018: EF 50-55 Cath 2018: normal coronary arteries  Asthma, OHS/restriction (Dr. [name]) Chronic respiratory failure  Home O2 for sleep and prn Hypertension  GERD  Prior CV studies:   Echo:  02/26/21 EF 40-45% trivial MR    RIGHT/LEFT HEART CATH AND CORONARY ANGIOGRAPHY 06/17/2017 1. Normal coronary anatomy 2. Mild to moderate LV dysfunction. EF estimated at 40-45% 3. Moderate pulmonary HTN 4. Moderately elevated LV filling pressures. 5. Reduced cardiac output.   History of Present Illness:    60 y.o. history of non ischemic DCM EF 40-45% Cath 06/17/17 no obstructive CAD Myovue done 09/08/20 to clear for shoulder surgery no ischemia or infarct EF estimated 52% Monitor done 10/10/20 no PAF or significant arrhythmias  Had uncomplicated right rotator cuff  surgery by Dr Tamera Punt on 09/18/20.    Energy level low still with some tendon issues in her elbow area Not working currently     Past Medical History:  Diagnosis Date   Abnormal heart rhythm    Allergic rhinitis    skin test POS 11/08/08   Asthma    Disorder of vocal cord    Dyspnea    Fibromyalgia    GERD (gastroesophageal reflux disease)    History of nuclear stress test    Myoview 3/22: EF 52, normal perfusion, low risk   Hypertension    Sleep apnea    borderline     Current Medications: No outpatient medications have been marked as taking for the 03/12/21 encounter (Appointment) with Josue Hector, MD.     Allergies:   Celebrex [celecoxib], Pregabalin, and Sulfa antibiotics   Social History   Tobacco Use   Smoking status: Former    Packs/day: 0.30    Years: 6.00    Pack years: 1.80    Types: Cigarettes    Quit date: 07/05/1989    Years since quitting: 31.7   Smokeless tobacco: Never  Vaping Use   Vaping Use: Never used  Substance Use Topics   Alcohol use: No   Drug use: No     Family Hx: The patient's family history includes Asthma in her mother and son; Breast cancer in her mother; Coronary artery disease in her father and mother; Depression in her mother; Diabetes in her brother; Heart disease in her father and mother; Hypertension in her  brother, father, and mother; Stroke in her father and maternal grandfather.  ROS   EKGs/Labs/Other Test Reviewed:    EKG:   08/29/20 NSR, HR 91, left axis deviation, no ST-T wave changes, QTC 437  Recent Labs: 09/11/2020: BUN 11; Creatinine, Ser 0.64; Hemoglobin 12.5; Platelets 332; Potassium 3.5; Sodium 140   Recent Lipid Panel Lab Results  Component Value Date/Time   CHOL 126 01/03/2018 10:43 AM   TRIG 167 (H) 01/03/2018 10:43 AM   HDL 42 01/03/2018 10:43 AM   CHOLHDL 3.0 01/03/2018 10:43 AM   LDLCALC 51 01/03/2018 10:43 AM      Risk Assessment/Calculations:      Physical Exam:    VS:  LMP 10/17/2017  Comment: tubal ligation    Wt Readings from Last 3 Encounters:  02/18/21 83.2 kg  09/18/20 85.7 kg  09/15/20 93.3 kg     Overweight black female No distress No tachypnea    ASSESSMENT & PLAN:    CHF:  non ischemic DCM no obstructive disease on cath 2019 and normal myovue 09/08/20 continue current medical Rx Discussed changing AR[facility] to [facility] She will call if affordable to change  Asthma:  f/u with pulmonary Using Duoneb and Anoro Ellipta  Palpitations:  benign monitor 10/10/20 rare PVCls no significant arrhythmia Ortho:  shoulder better but still with elbow tendon issues trying to arrange ortho f/u   5. Allergic asthma, moderate persistent, uncomplicated She may benefit from pulmonary consultation prior to her surgical procedure.  6. Palpitations Etiology not entirely clear.  Arrange 14-day Zio patch monitor.  She will not need to complete her monitor prior to her surgery.  F/U in a year unless able to change to entresto then Pharm D with BMET in 3-4 weeks   Time:  reviewing chart last echo op note ortho direct patient interview and composing note 21 minutes     Signed, Jenkins Rouge, MD  03/12/2021 8:42 AM    [facility] [place], [place],   [address] Phone: [phone]; Fax: [phone]

## 2021-03-11 ENCOUNTER — Other Ambulatory Visit (HOSPITAL_COMMUNITY): Payer: Self-pay

## 2021-03-12 ENCOUNTER — Telehealth: Payer: Self-pay

## 2021-03-12 ENCOUNTER — Other Ambulatory Visit: Payer: Self-pay

## 2021-03-12 ENCOUNTER — Telehealth (INDEPENDENT_AMBULATORY_CARE_PROVIDER_SITE_OTHER): Payer: Medicaid Other | Admitting: Cardiovascular Disease

## 2021-03-12 DIAGNOSIS — I420 Dilated cardiomyopathy: Secondary | ICD-10-CM

## 2021-03-12 DIAGNOSIS — R002 Palpitations: Secondary | ICD-10-CM

## 2021-03-12 DIAGNOSIS — J4540 Moderate persistent asthma, uncomplicated: Secondary | ICD-10-CM | POA: Diagnosis not present

## 2021-03-12 NOTE — Telephone Encounter (Signed)
Called patient about Entresto. Patient stated that her pharmacy said that her insurance would not cover the [facility] and it would cost her $700. Informed patient that we do have a 30 day free trail card that she can use and she could apply for patient assistance. Patient would like to think about doing that, but would like to know if Dr. [name] has an alternative medication she could try besides the [facility].

## 2021-03-12 NOTE — Telephone Encounter (Signed)
Called patient back to let her know that losartan is the medication she will stay on if she does not switch to [facility]. Patient stated she will stay on the losartan for now and will give our office a call if she changes her mind.

## 2021-03-12 NOTE — Patient Instructions (Signed)
Medication Instructions:  *If you need a refill on your cardiac medications before your next appointment, please call your pharmacy*  Lab Work: If you have labs (blood work) drawn today and your tests are completely normal, you will receive your results only by: [place] (if you have MyChart) OR A paper copy in the mail If you have any lab test that is abnormal or we need to change your treatment, we will call you to review the results.  Follow-Up: At [facility], you and your health needs are our priority.  As part of our continuing mission to provide you with exceptional heart care, we have created designated Provider Care Teams.  These Care Teams include your primary Cardiologist (physician) and Advanced Practice Providers (APPs -  Physician Assistants and Nurse Practitioners) who all work together to provide you with the care you need, when you need it.  We recommend signing up for the patient portal called "MyChart".  Sign up information is provided on this After Visit Summary.  MyChart is used to connect with patients for Virtual Visits (Telemedicine).  Patients are able to view lab/test results, encounter notes, upcoming appointments, etc.  Non-urgent messages can be sent to your provider as well.   To learn more about what you can do with MyChart, go to [email or web address].    Your next appointment:   6 month(s)  The format for your next appointment:   In Person  Provider:   You may see Jenkins Rouge, MD or one of the following Advanced Practice Providers on your designated Care Team:   Cecilie Kicks, NP    Other Instructions Dr. [name] is wanting to start you on Entresto. Please give our office a call if you want to start this medication.

## 2021-03-13 ENCOUNTER — Other Ambulatory Visit: Payer: Self-pay | Admitting: Internal Medicine

## 2021-03-13 ENCOUNTER — Other Ambulatory Visit (HOSPITAL_COMMUNITY): Payer: Self-pay

## 2021-03-13 DIAGNOSIS — J4551 Severe persistent asthma with (acute) exacerbation: Secondary | ICD-10-CM

## 2021-03-13 DIAGNOSIS — J4540 Moderate persistent asthma, uncomplicated: Secondary | ICD-10-CM

## 2021-03-19 ENCOUNTER — Other Ambulatory Visit: Payer: Self-pay

## 2021-03-19 ENCOUNTER — Ambulatory Visit (INDEPENDENT_AMBULATORY_CARE_PROVIDER_SITE_OTHER): Payer: Medicaid Other

## 2021-03-19 VITALS — BP 180/74 | HR 82 | Temp 99.1°F | Resp 18

## 2021-03-19 DIAGNOSIS — J4550 Severe persistent asthma, uncomplicated: Secondary | ICD-10-CM | POA: Diagnosis not present

## 2021-03-19 MED ORDER — ALBUTEROL SULFATE HFA 108 (90 BASE) MCG/ACT IN AERS: 2.0000 | INHALATION_SPRAY | Freq: Once | RESPIRATORY_TRACT | Status: DC | PRN

## 2021-03-19 MED ORDER — BENRALIZUMAB 30 MG/ML ~~LOC~~ SOSY
30.0000 mg | PREFILLED_SYRINGE | Freq: Once | SUBCUTANEOUS | Status: AC
  Administered 2021-03-19: 30 mg via SUBCUTANEOUS
  Filled 2021-03-19: qty 1

## 2021-03-19 MED ORDER — METHYLPREDNISOLONE SODIUM SUCC 125 MG IJ SOLR: 125.0000 mg | Freq: Once | INTRAMUSCULAR | Status: DC | PRN

## 2021-03-19 MED ORDER — DIPHENHYDRAMINE HCL 50 MG/ML IJ SOLN: 50.0000 mg | Freq: Once | INTRAMUSCULAR | Status: DC | PRN

## 2021-03-19 MED ORDER — FAMOTIDINE IN NACL 20-0.9 MG/50ML-% IV SOLN: 20.0000 mg | Freq: Once | INTRAVENOUS | Status: DC | PRN

## 2021-03-19 MED ORDER — SODIUM CHLORIDE 0.9 % IV SOLN: Freq: Once | INTRAVENOUS | Status: DC | PRN

## 2021-03-19 MED ORDER — EPINEPHRINE 0.3 MG/0.3ML IJ SOAJ: 0.3000 mg | Freq: Once | INTRAMUSCULAR | Status: DC | PRN

## 2021-03-19 NOTE — Progress Notes (Signed)
Diagnosis: Asthma  Provider:  Marshell Garfinkel, MD  Procedure: Injection  Fasenra (Benralizumab), Dose: 30 mg, Site: subcutaneous left arm.  Discharge: Condition: Good, Destination: Home . AVS provided to patient.   Performed by:  Alga Southall, Sherlon Handing, LPN

## 2021-03-20 ENCOUNTER — Other Ambulatory Visit: Payer: Self-pay | Admitting: Obstetrics

## 2021-03-20 ENCOUNTER — Other Ambulatory Visit: Payer: Self-pay | Admitting: Internal Medicine

## 2021-03-20 DIAGNOSIS — B373 Candidiasis of vulva and vagina: Secondary | ICD-10-CM

## 2021-03-20 DIAGNOSIS — B3731 Acute candidiasis of vulva and vagina: Secondary | ICD-10-CM

## 2021-03-24 ENCOUNTER — Other Ambulatory Visit (HOSPITAL_COMMUNITY): Payer: Self-pay

## 2021-03-24 ENCOUNTER — Telehealth: Payer: Self-pay | Admitting: Cardiovascular Disease

## 2021-03-24 DIAGNOSIS — R0602 Shortness of breath: Secondary | ICD-10-CM

## 2021-03-24 DIAGNOSIS — I420 Dilated cardiomyopathy: Secondary | ICD-10-CM

## 2021-03-24 DIAGNOSIS — R600 Localized edema: Secondary | ICD-10-CM

## 2021-03-24 MED ORDER — FASENRA PEN 30 MG/ML ~~LOC~~ SOAJ
SUBCUTANEOUS | 2 refills | Status: DC
  Filled 2021-03-24 – 2021-05-08 (×2): qty 1, 56d supply, fill #0
  Filled 2021-07-01: qty 1, 56d supply, fill #1
  Filled 2021-08-31: qty 1, 56d supply, fill #2

## 2021-03-24 NOTE — Telephone Encounter (Signed)
Pt reporting BPs are running high.  180/85-90 Discussed medications listed.  She reports taking all as ordered Does not seem to get same effect from torsemide and spironolactone as she did when she first started it.  Her SOB is worse than normal.  She is SOB on phone walking and talking w me.  She gets an every other month injection for asthma at pulmonary.  Usually helps breathing.  Injection was 9/15 and breathing got better only for a few days.  We discussed the potential switch to [facility] again, she will check into eligibility for patient assistance and let us know.  Pt aware I will forward to Dr. [name] who had video visit with pt 03/13/21 for recommendations and will call her back.

## 2021-03-24 NOTE — Telephone Encounter (Signed)
Message received from Dr. [name]: Check BMET/BNP and CXR suspect this is more pulmonary agree with change to entresto if possible      Pt has been notified.  Will come for labs and go to [place] for CXR on 03/26/21.  She also reports BLE swelling that is a little worse than normal.

## 2021-03-24 NOTE — Telephone Encounter (Signed)
Pt c/o medication issue:  1. Name of Medication: Spironolactone 25 mg and torsemide   2. How are you currently taking this medication (dosage and times per day)? Once a day   3. Are you having a reaction (difficulty breathing--STAT)? No   4. What is your medication issue? BP still going up.

## 2021-03-25 ENCOUNTER — Ambulatory Visit
Admission: RE | Admit: 2021-03-25 | Discharge: 2021-03-25 | Disposition: A | Discharge status: Home / Self Care | Payer: Medicaid Other | Source: Ambulatory Visit | Attending: Cardiovascular Disease | Admitting: Cardiovascular Disease

## 2021-03-25 DIAGNOSIS — I420 Dilated cardiomyopathy: Secondary | ICD-10-CM

## 2021-03-25 DIAGNOSIS — R0600 Dyspnea, unspecified: Secondary | ICD-10-CM | POA: Diagnosis not present

## 2021-03-25 DIAGNOSIS — R600 Localized edema: Secondary | ICD-10-CM

## 2021-03-25 DIAGNOSIS — R0602 Shortness of breath: Secondary | ICD-10-CM | POA: Diagnosis not present

## 2021-03-26 ENCOUNTER — Other Ambulatory Visit: Payer: Self-pay

## 2021-03-26 ENCOUNTER — Other Ambulatory Visit: Payer: Medicaid Other | Admitting: *Deleted

## 2021-03-26 DIAGNOSIS — R0602 Shortness of breath: Secondary | ICD-10-CM | POA: Diagnosis not present

## 2021-03-26 DIAGNOSIS — I420 Dilated cardiomyopathy: Secondary | ICD-10-CM | POA: Diagnosis not present

## 2021-03-26 DIAGNOSIS — R600 Localized edema: Secondary | ICD-10-CM | POA: Diagnosis not present

## 2021-03-26 NOTE — Telephone Encounter (Signed)
Patient calling to let Dr. Kyla Balzarine Nurse know that she did not qualify for the patient assistance program and is not sure what to do next. Please advise

## 2021-03-26 NOTE — Telephone Encounter (Signed)
Called patient back. Patient stated she did not qualify for patient assistance for entresto. Patient stated she will not be able to afford it without the assistance. Encouraged patient to continue on her losartan for now and a message would be sent to Dr. [name] for further advisement.

## 2021-03-27 LAB — BASIC METABOLIC PANEL
BUN/Creatinine Ratio: 13 (ref 9–23)
BUN: 8 mg/dL (ref 6–24)
CO2: 23 mmol/L (ref 20–29)
Calcium: 9.7 mg/dL (ref 8.7–10.2)
Chloride: 106 mmol/L (ref 96–106)
Creatinine, Ser: 0.61 mg/dL (ref 0.57–1.00)
Glucose: 113 mg/dL — ABNORMAL HIGH (ref 65–99)
Potassium: 3.8 mmol/L (ref 3.5–5.2)
Sodium: 142 mmol/L (ref 134–144)
eGFR: 104 mL/min/{1.73_m2} (ref >59)

## 2021-03-27 LAB — PRO B NATRIURETIC PEPTIDE: NT-Pro BNP: 35 pg/mL (ref 0–287)

## 2021-03-30 NOTE — Telephone Encounter (Signed)
Patient's BNP was normal, patient aware of results.

## 2021-04-04 DIAGNOSIS — Z419 Encounter for procedure for purposes other than remedying health state, unspecified: Secondary | ICD-10-CM | POA: Diagnosis not present

## 2021-04-07 ENCOUNTER — Telehealth: Payer: Self-pay | Admitting: Cardiovascular Disease

## 2021-04-07 ENCOUNTER — Other Ambulatory Visit: Payer: Self-pay | Admitting: Internal Medicine

## 2021-04-07 NOTE — Telephone Encounter (Signed)
Called pt back in regards to potential weight loss medication.  Pt does not know the name of medication.  She expresses that PCP just wants a clearance before starting medication.  I informed her that its hard to clear when we don't know which medication will be prescribed.  Pt expressed she will call PCP office back.

## 2021-04-07 NOTE — Telephone Encounter (Signed)
Pt's PCP is wanting to prescribe the pt a Weight Loss pill but advised the pt that it would need to be cleared by Dr. [name] first. Please advise the pt further

## 2021-04-08 ENCOUNTER — Telehealth: Payer: Self-pay | Admitting: Internal Medicine

## 2021-04-08 MED ORDER — PREDNISONE 10 MG PO TABS: ORAL_TABLET | ORAL | 0 refills | Status: DC

## 2021-04-08 NOTE — Telephone Encounter (Signed)
Called patient back with Dr. Kyla Balzarine advisement. "This is a stimulant and amphetamine like compound and not good for her heart or BP would try to avoid if possible It is only approved for use for a few weeks at a time".  Patient verbalized understanding.

## 2021-04-08 NOTE — Telephone Encounter (Signed)
Patient was returning call and to let Dr. [name] know the name of the diet medication. It is called Diethylpropion

## 2021-04-08 NOTE — Telephone Encounter (Signed)
Called and spoke with patient. She verbalized understanding of CY's recommendations. RX for prednisone has been sent in.   She also stated on the phone that she was trying to get scheduled for knee replacement surgery with Dr. Ronnie Derby but she has decided to hold off on this for now.   Nothing further needed at time of call.

## 2021-04-08 NOTE — Telephone Encounter (Signed)
Pt states she's not sure if the xray Dr [name] just did was shared with CY or not, but it showed pt had a partially collapsed lung. Pt wanting to know if there's anything CY can do, pt is having chest pain, feels like it's more lung than heart. Has tried nebulizer and inhaler with no relief. Please advise [phone]

## 2021-04-08 NOTE — Telephone Encounter (Signed)
Spoke with pt who states CXR from 03/25/21 showed collapsed lung and pt is asking if Dr. [name] had time to review CXR. Pt states she has been having SOB x 1 month as well as chest pain. Pt states she has attempted to use albuterol HFA and nebulizer which did not provide any relief. Pt states current O2 saturation is 93% on 2L. Pt states she was unable to refill prednisone and would like Dr. [name] to consider doing that. Pt also states that she feels injections are lasting long enough and that fact may contribute to her SOB. Dr. [name] please advise.

## 2021-04-08 NOTE — Telephone Encounter (Signed)
CXR shows only very mild abnormality. We can discuss that and her injection therapy at her next office appointment later this month. Ok to send prednisone taper   prednisone 10 mg, # 20, 4 X 2 DAYS, 3 X 2 DAYS, 2 X 2 DAYS, 1 X 2 DAYS

## 2021-04-13 ENCOUNTER — Other Ambulatory Visit: Payer: Self-pay | Admitting: Internal Medicine

## 2021-04-13 ENCOUNTER — Other Ambulatory Visit: Payer: Self-pay | Admitting: Obstetrics

## 2021-04-13 DIAGNOSIS — B3731 Acute candidiasis of vulva and vagina: Secondary | ICD-10-CM

## 2021-04-13 NOTE — Telephone Encounter (Signed)
Ambien refilled

## 2021-04-16 ENCOUNTER — Other Ambulatory Visit: Payer: Self-pay | Admitting: Internal Medicine

## 2021-04-20 ENCOUNTER — Other Ambulatory Visit: Payer: Self-pay | Admitting: Obstetrics

## 2021-04-20 DIAGNOSIS — B3731 Acute candidiasis of vulva and vagina: Secondary | ICD-10-CM

## 2021-04-24 ENCOUNTER — Other Ambulatory Visit: Payer: Self-pay | Admitting: Obstetrics

## 2021-04-24 ENCOUNTER — Other Ambulatory Visit: Payer: Self-pay | Admitting: Internal Medicine

## 2021-04-24 DIAGNOSIS — B3731 Acute candidiasis of vulva and vagina: Secondary | ICD-10-CM

## 2021-04-24 DIAGNOSIS — B356 Tinea cruris: Secondary | ICD-10-CM

## 2021-04-26 DIAGNOSIS — J449 Chronic obstructive pulmonary disease, unspecified: Secondary | ICD-10-CM | POA: Diagnosis not present
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
## 2021-04-30 ENCOUNTER — Ambulatory Visit: Payer: Medicaid Other | Admitting: Internal Medicine

## 2021-05-04 DIAGNOSIS — F5101 Primary insomnia: Secondary | ICD-10-CM | POA: Diagnosis not present

## 2021-05-04 DIAGNOSIS — F332 Major depressive disorder, recurrent severe without psychotic features: Secondary | ICD-10-CM | POA: Diagnosis not present

## 2021-05-04 DIAGNOSIS — F4310 Post-traumatic stress disorder, unspecified: Secondary | ICD-10-CM | POA: Diagnosis not present

## 2021-05-05 ENCOUNTER — Other Ambulatory Visit (HOSPITAL_COMMUNITY): Payer: Self-pay

## 2021-05-05 ENCOUNTER — Ambulatory Visit: Payer: Medicaid Other

## 2021-05-05 DIAGNOSIS — Z419 Encounter for procedure for purposes other than remedying health state, unspecified: Secondary | ICD-10-CM | POA: Diagnosis not present
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
## 2021-05-07 ENCOUNTER — Other Ambulatory Visit: Payer: Self-pay

## 2021-05-07 ENCOUNTER — Ambulatory Visit: Payer: Medicaid Other | Admitting: Internal Medicine

## 2021-05-07 DIAGNOSIS — M79641 Pain in right hand: Secondary | ICD-10-CM | POA: Diagnosis not present

## 2021-05-08 ENCOUNTER — Other Ambulatory Visit (HOSPITAL_COMMUNITY): Payer: Self-pay

## 2021-05-12 ENCOUNTER — Other Ambulatory Visit: Payer: Self-pay | Admitting: Internal Medicine

## 2021-05-12 ENCOUNTER — Other Ambulatory Visit: Payer: Self-pay | Admitting: Obstetrics

## 2021-05-12 DIAGNOSIS — R52 Pain, unspecified: Secondary | ICD-10-CM

## 2021-05-12 NOTE — Telephone Encounter (Deleted)
PROAIR HFA 108 (90 Base) MCG/ACT inhaler       Changed from: albuterol (PROAIR HFA) 108 (90 Base) MCG/ACT inhaler   Sig: TAKE 2 PUFFS EVERY 4 HOURS AS NEEDED -RESCUE   Disp:  8.5 each    Refills:  12   Start: 05/12/2021   Class: Normal   Non-formulary   Last ordered: 7 months ago by Deneise Lever, MD Last refill: 05/12/2021   Rx #: [id number]   Pharmacy comment: Alternative Requested:PROAIR DISCONTINUED ISSUE RX FOR VENTOLIN.    Dr. [name] please advise if switch of inhaler is okay. Thanks!

## 2021-05-14 ENCOUNTER — Ambulatory Visit: Payer: Self-pay
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
## 2021-05-18 ENCOUNTER — Other Ambulatory Visit: Payer: Self-pay

## 2021-05-18 ENCOUNTER — Encounter: Payer: Self-pay | Admitting: Internal Medicine

## 2021-05-18 ENCOUNTER — Ambulatory Visit (INDEPENDENT_AMBULATORY_CARE_PROVIDER_SITE_OTHER): Payer: Medicaid Other

## 2021-05-18 ENCOUNTER — Ambulatory Visit (INDEPENDENT_AMBULATORY_CARE_PROVIDER_SITE_OTHER): Payer: Medicaid Other | Admitting: Internal Medicine

## 2021-05-18 VITALS — BP 132/80 | HR 90 | Temp 98.5°F | Ht 60.0 in | Wt 192.8 lb

## 2021-05-18 VITALS — BP 146/78 | HR 96 | Temp 99.1°F | Resp 18 | Ht 60.0 in | Wt 192.8 lb

## 2021-05-18 DIAGNOSIS — J9611 Chronic respiratory failure with hypoxia: Secondary | ICD-10-CM

## 2021-05-18 DIAGNOSIS — J4550 Severe persistent asthma, uncomplicated: Secondary | ICD-10-CM | POA: Diagnosis not present

## 2021-05-18 DIAGNOSIS — J4540 Moderate persistent asthma, uncomplicated: Secondary | ICD-10-CM

## 2021-05-18 DIAGNOSIS — J4551 Severe persistent asthma with (acute) exacerbation: Secondary | ICD-10-CM | POA: Diagnosis not present

## 2021-05-18 DIAGNOSIS — G4733 Obstructive sleep apnea (adult) (pediatric): Secondary | ICD-10-CM

## 2021-05-18 MED ORDER — SODIUM CHLORIDE 0.9 % IV SOLN: Freq: Once | INTRAVENOUS | Status: DC | PRN

## 2021-05-18 MED ORDER — METHYLPREDNISOLONE SODIUM SUCC 125 MG IJ SOLR: 125.0000 mg | Freq: Once | INTRAMUSCULAR | Status: DC | PRN

## 2021-05-18 MED ORDER — ALBUTEROL SULFATE HFA 108 (90 BASE) MCG/ACT IN AERS: 2.0000 | INHALATION_SPRAY | Freq: Once | RESPIRATORY_TRACT | Status: DC | PRN

## 2021-05-18 MED ORDER — EPINEPHRINE 0.3 MG/0.3ML IJ SOAJ: 0.3000 mg | Freq: Once | INTRAMUSCULAR | Status: DC | PRN

## 2021-05-18 MED ORDER — BENRALIZUMAB 30 MG/ML ~~LOC~~ SOSY
30.0000 mg | PREFILLED_SYRINGE | Freq: Once | SUBCUTANEOUS | Status: AC
  Administered 2021-05-18: 30 mg via SUBCUTANEOUS
  Filled 2021-05-18: qty 1

## 2021-05-18 MED ORDER — FAMOTIDINE IN NACL 20-0.9 MG/50ML-% IV SOLN: 20.0000 mg | Freq: Once | INTRAVENOUS | Status: DC | PRN

## 2021-05-18 MED ORDER — DIPHENHYDRAMINE HCL 50 MG/ML IJ SOLN: 50.0000 mg | Freq: Once | INTRAMUSCULAR | Status: DC | PRN

## 2021-05-18 NOTE — Patient Instructions (Signed)
Order- DME [facility]   small travel nebulizer machine dx Asthma, severe persistent  Order- CXR  dx Asthma severe persistent  Please call as needed

## 2021-05-18 NOTE — Progress Notes (Signed)
Diagnosis: Asthma  Provider:  Marshell Garfinkel, MD  Procedure: Injection  Fasenra (Benralizumab), Dose: 30 mg, Site: subcutaneous  Discharge: Condition: Good, Destination: Home . AVS provided to patient.   Performed by:  Koren Shiver, RN

## 2021-05-20 ENCOUNTER — Encounter: Payer: Self-pay | Admitting: *Deleted

## 2021-05-20 ENCOUNTER — Telehealth: Payer: Self-pay | Admitting: Pharmacy Technician

## 2021-05-20 NOTE — Telephone Encounter (Addendum)
Auth Submission: NO AUTH REQUIRED - 6 month tracking  Payer: [employer]ICAID Medication & CPT/J Code(s) submitted: [name] (Benralizumab) [phone] Route of submission (phone, fax, portal): [phone] REF# [id number] 6 month tracking only. 05/20/21 - 11/02/21

## 2021-05-25 ENCOUNTER — Encounter: Payer: Self-pay | Admitting: Cardiovascular Disease

## 2021-05-25 ENCOUNTER — Other Ambulatory Visit: Payer: Self-pay | Admitting: Obstetrics

## 2021-05-25 DIAGNOSIS — Z96651 Presence of right artificial knee joint: Secondary | ICD-10-CM | POA: Diagnosis not present

## 2021-05-25 DIAGNOSIS — G8929 Other chronic pain: Secondary | ICD-10-CM | POA: Diagnosis not present

## 2021-05-25 DIAGNOSIS — B3731 Acute candidiasis of vulva and vagina: Secondary | ICD-10-CM

## 2021-05-25 DIAGNOSIS — M25562 Pain in left knee: Secondary | ICD-10-CM | POA: Diagnosis not present

## 2021-05-25 DIAGNOSIS — M25561 Pain in right knee: Secondary | ICD-10-CM | POA: Diagnosis not present

## 2021-05-25 NOTE — Telephone Encounter (Signed)
error 

## 2021-05-29 DIAGNOSIS — S46211A Strain of muscle, fascia and tendon of other parts of biceps, right arm, initial encounter: Secondary | ICD-10-CM | POA: Diagnosis not present

## 2021-06-01 ENCOUNTER — Telehealth: Payer: Self-pay | Admitting: Internal Medicine

## 2021-06-01 NOTE — Telephone Encounter (Signed)
Fax received from Dr. Lara Mulch with [facility] to perform a Rt total knee arthroplasty- conversion from partial on patient.  Patient needs surgery clearance. Patient was seen on 05/18/2021. Office protocol is a risk assessment can be sent to surgeon if patient has been seen in 60 days or less.   Sending to Dr. [name] for risk assessment since patient was just seen

## 2021-06-02 NOTE — Assessment & Plan Note (Signed)
She continues to sleep with O2 2l, but rarely uses during day.  Arrival O2 sat today on room air 95%

## 2021-06-02 NOTE — Assessment & Plan Note (Signed)
Encouraged to strive for weight loss.

## 2021-06-02 NOTE — Assessment & Plan Note (Signed)
Fasenra injections have helped. We feel her current meds will be sufficient. Plan- CXR, small portable nebulizer machine for use when needed

## 2021-06-02 NOTE — Telephone Encounter (Signed)
Office note from 11/14 includes preoperative clearance statement for Dr Ruel Favors office

## 2021-06-03 NOTE — Telephone Encounter (Signed)
Ov notes and clearance form have been faxed back to [facility]. Nothing further needed at this time.

## 2021-06-04 DIAGNOSIS — M79641 Pain in right hand: Secondary | ICD-10-CM | POA: Diagnosis not present

## 2021-06-04 DIAGNOSIS — Z419 Encounter for procedure for purposes other than remedying health state, unspecified: Secondary | ICD-10-CM | POA: Diagnosis not present

## 2021-06-04 DIAGNOSIS — M25521 Pain in right elbow: Secondary | ICD-10-CM | POA: Diagnosis not present

## 2021-06-09 ENCOUNTER — Ambulatory Visit: Payer: Medicaid Other

## 2021-06-12 ENCOUNTER — Other Ambulatory Visit: Payer: Self-pay | Admitting: Obstetrics

## 2021-06-12 DIAGNOSIS — B3731 Acute candidiasis of vulva and vagina: Secondary | ICD-10-CM

## 2021-06-23 ENCOUNTER — Other Ambulatory Visit: Payer: Self-pay | Admitting: Internal Medicine

## 2021-06-23 ENCOUNTER — Other Ambulatory Visit: Payer: Self-pay | Admitting: Cardiovascular Disease

## 2021-06-24 DIAGNOSIS — M25521 Pain in right elbow: Secondary | ICD-10-CM | POA: Diagnosis not present

## 2021-06-24 DIAGNOSIS — M79641 Pain in right hand: Secondary | ICD-10-CM | POA: Diagnosis not present

## 2021-06-24 DIAGNOSIS — M25621 Stiffness of right elbow, not elsewhere classified: Secondary | ICD-10-CM | POA: Diagnosis not present

## 2021-06-24 DIAGNOSIS — T148XXA Other injury of unspecified body region, initial encounter: Secondary | ICD-10-CM | POA: Diagnosis not present

## 2021-06-24 DIAGNOSIS — M25631 Stiffness of right wrist, not elsewhere classified: Secondary | ICD-10-CM | POA: Diagnosis not present

## 2021-07-01 ENCOUNTER — Other Ambulatory Visit (HOSPITAL_COMMUNITY): Payer: Self-pay

## 2021-07-05 DIAGNOSIS — Z419 Encounter for procedure for purposes other than remedying health state, unspecified: Secondary | ICD-10-CM | POA: Diagnosis not present

## 2021-07-06 ENCOUNTER — Other Ambulatory Visit: Payer: Self-pay | Admitting: Obstetrics

## 2021-07-06 ENCOUNTER — Other Ambulatory Visit: Payer: Self-pay | Admitting: Internal Medicine

## 2021-07-06 DIAGNOSIS — B356 Tinea cruris: Secondary | ICD-10-CM

## 2021-07-06 DIAGNOSIS — B3731 Acute candidiasis of vulva and vagina: Secondary | ICD-10-CM

## 2021-07-08 ENCOUNTER — Telehealth: Payer: Self-pay | Admitting: Internal Medicine

## 2021-07-08 ENCOUNTER — Ambulatory Visit (INDEPENDENT_AMBULATORY_CARE_PROVIDER_SITE_OTHER): Payer: Medicaid Other | Admitting: *Deleted

## 2021-07-08 ENCOUNTER — Other Ambulatory Visit: Payer: Self-pay | Admitting: Internal Medicine

## 2021-07-08 ENCOUNTER — Other Ambulatory Visit: Payer: Self-pay

## 2021-07-08 ENCOUNTER — Other Ambulatory Visit (HOSPITAL_COMMUNITY): Payer: Self-pay

## 2021-07-08 VITALS — BP 168/92 | HR 98 | Temp 99.6°F | Resp 16 | Ht 60.0 in | Wt 193.4 lb

## 2021-07-08 DIAGNOSIS — J4550 Severe persistent asthma, uncomplicated: Secondary | ICD-10-CM

## 2021-07-08 MED ORDER — DIPHENHYDRAMINE HCL 50 MG/ML IJ SOLN: 50.0000 mg | Freq: Once | INTRAMUSCULAR | Status: DC | PRN

## 2021-07-08 MED ORDER — SODIUM CHLORIDE 0.9 % IV SOLN: Freq: Once | INTRAVENOUS | Status: DC | PRN

## 2021-07-08 MED ORDER — EPINEPHRINE 0.3 MG/0.3ML IJ SOAJ: 0.3000 mg | Freq: Once | INTRAMUSCULAR | Status: DC | PRN

## 2021-07-08 MED ORDER — FAMOTIDINE IN NACL 20-0.9 MG/50ML-% IV SOLN: 20.0000 mg | Freq: Once | INTRAVENOUS | Status: DC | PRN

## 2021-07-08 MED ORDER — BENRALIZUMAB 30 MG/ML ~~LOC~~ SOSY
30.0000 mg | PREFILLED_SYRINGE | Freq: Once | SUBCUTANEOUS | Status: AC
  Administered 2021-07-08: 30 mg via SUBCUTANEOUS
  Filled 2021-07-08: qty 1

## 2021-07-08 MED ORDER — METHYLPREDNISOLONE SODIUM SUCC 125 MG IJ SOLR: 125.0000 mg | Freq: Once | INTRAMUSCULAR | Status: DC | PRN

## 2021-07-08 MED ORDER — PREDNISONE 10 MG PO TABS
ORAL_TABLET | ORAL | 0 refills | Status: DC
Start: 2021-07-08 — End: 2021-09-15

## 2021-07-08 MED ORDER — ALBUTEROL SULFATE HFA 108 (90 BASE) MCG/ACT IN AERS: 2.0000 | INHALATION_SPRAY | Freq: Once | RESPIRATORY_TRACT | Status: DC | PRN

## 2021-07-08 NOTE — Telephone Encounter (Signed)
Offer prednisone 10 mg, # 20, 4 X 2 DAYS, 3 X 2 DAYS, 2 X 2 DAYS, 1 X 2 DAYS  

## 2021-07-08 NOTE — Progress Notes (Signed)
Diagnosis: Asthma  Provider:  Praveen Mannam, MD  Procedure: Injection  Fasenra (Benralizumab), Dose: 30 mg, Site: subcutaneous, Number of injections: 1  Discharge: Condition: Good, Destination: Home . AVS provided to patient.   Performed by:  Marsa Matteo A, RN       

## 2021-07-08 NOTE — Telephone Encounter (Signed)
Discontinued 05/16/21 by provider

## 2021-07-08 NOTE — Telephone Encounter (Signed)
Call ed and spoke to patient.  C/o increased sob with exertion and at rest, wheezing, chest discomfort with deep breathing and mild prod cough with grey to white sputum x9d. Denied f/c/s or additional sx. She gets about 4min of relief with albuterol solution.  She is using albuterol solution 4-5x daily, albuterol HFA QID and Anoro daily. Patient became very upset during call after asking vaccination status and disconnected call prematurely  Dr. [name], please advise.  Thanks  Current Outpatient Medications on File Prior to Visit  Medication Sig Dispense Refill   albuterol (PROAIR HFA) 108 (90 Base) MCG/ACT inhaler TAKE 2 PUFFS EVERY 4 HOURS AS NEEDED -RESCUE 8.5 each 5   ALPRAZolam (XANAX) 0.5 MG tablet Take 0.5 mg by mouth 3 (three) times daily as needed (anxiety). Anxiety.     Benralizumab (FASENRA PEN) 30 MG/ML SOAJ INJECT 30 MG INTO THE SKIN EVERY 8 (EIGHT) WEEKS. DELIVERY TO [facility]: [address], [address], [place], [place] [address] 1 mL 2   clotrimazole-betamethasone (LOTRISONE) cream APPLY TO AFFECTED AREA TWICE A DAY 45 g 1   EPINEPHRINE 0.3 mg/0.3 mL IJ SOAJ injection INJECT INTO THE THIGH ONCE FOR SEVERE ALLERGIC REACTION. 2 each 5   fluconazole (DIFLUCAN) 150 MG tablet TAKE 1 TABLET BY MOUTH AS ONE DOSE 1 tablet 0   ibuprofen (ADVIL) 800 MG tablet TAKE 1 TABLET BY MOUTH EVERY 8 HOURS AS NEEDED 30 tablet 5   ipratropium-albuterol (DUONEB) 0.5-2.5 (3) MG/3ML SOLN INHALE 3 MLS INTO THE LUNGS EVERY 4 (FOUR) HOURS AS NEEDED (ASTHMA). USE 1 VIAL VIA NEBULIZER EVERY 4 HRS AS NEEDED 360 mL 1   isosorbide mononitrate (IMDUR) 30 MG 24 hr tablet Take 30 mg by mouth daily.     losartan (COZAAR) 100 MG tablet TAKE 1 TABLET BY MOUTH EVERY DAY 90 tablet 1   metoprolol tartrate (LOPRESSOR) 25 MG tablet TAKE 1 TABLET BY MOUTH TWICE A DAY 180 tablet 2   montelukast (SINGULAIR) 10 MG tablet Take 1 tablet (10 mg total) by mouth daily. 90 tablet 4   omeprazole (PRILOSEC) 40 MG capsule  Take 40 mg by mouth 2 (two) times daily.     ondansetron (ZOFRAN) 4 MG tablet Take 4 mg by mouth every 8 (eight) hours as needed for nausea or vomiting.   5   spironolactone (ALDACTONE) 25 MG tablet TAKE 1 TABLET BY MOUTH EVERY DAY 90 tablet 2   torsemide (DEMADEX) 20 MG tablet TAKE 1 TABLET BY MOUTH TWICE A DAY 180 tablet 1   umeclidinium-vilanterol (ANORO ELLIPTA) 62.5-25 MCG/INH AEPB TAKE 1 PUFF BY MOUTH EVERY DAY 60 each 12   No current facility-administered medications on file prior to visit.    Allergies  Allergen Reactions   Celebrex [Celecoxib] Swelling and Rash   Pregabalin Other (See Comments), Rash and Swelling    *LYRICA* REACTION: itching, swelling *LYRICA* REACTION: itching, swelling *LYRICA* REACTION: itching, swelling   Sulfa Antibiotics Other (See Comments)

## 2021-07-08 NOTE — Telephone Encounter (Signed)
Patient is aware of recommendations and voiced her understanding.  Prednisone sent to preferred pharmacy.  Nothing further needed at this time.

## 2021-07-10 ENCOUNTER — Other Ambulatory Visit (HOSPITAL_COMMUNITY): Payer: Self-pay

## 2021-07-10 ENCOUNTER — Telehealth: Payer: Self-pay

## 2021-07-10 NOTE — Telephone Encounter (Signed)
Patient Advocate Encounter   Received notification from patient calls that prior authorization for Epinephrine 0.3mg /0.5ml pen injectors is required by his/her insurance [employer].   PA submitted on 07/09/21  Key#: [id number]  Status is pending    [facility] will continue to follow:  Patient Advocate Fax: [phone]

## 2021-07-13 ENCOUNTER — Ambulatory Visit: Payer: Medicaid Other

## 2021-07-13 NOTE — Telephone Encounter (Signed)
Received a fax regarding Prior Authorization from [facility]) for Masthope 0.3MG . Authorization has been DENIED because EXCEEDS QUANTITY LIMIT OF 6 PENS PER 180 DAYS IT MAY BE REFILLED ON 1.29.23.

## 2021-07-14 ENCOUNTER — Other Ambulatory Visit (HOSPITAL_COMMUNITY): Payer: Self-pay

## 2021-07-14 NOTE — Telephone Encounter (Signed)
Called patient about Epipen refills since she has been filling monthly with [facility]. I called patient to inquire if she has been having anaphylactic reactions to [facility] or any medications/foods. She states she does not. I again asked if she is using Epipen every month but she states that pharmacy fills it every month. I advised her that she does not need to fill it if she is not using it.  She stated "if he doesn't want me on it then take me off of it."  I advised that we are comfortable with her continuing to have Epipen on hand. She stated "thank you for the information" and hung up call on me. Patient was upset with me regarding conversation.   Screenshot of filling history below. Routing to Dr. [name] for discussion at f/u visit on 09/15/21. I think in-person conversation with provider she trusts is warranted to ensure she is not using Epipen inappropriately.     Knox Saliva, PharmD, MPH, BCPS Clinical Pharmacist (Rheumatology and Pulmonology)

## 2021-07-21 ENCOUNTER — Other Ambulatory Visit (HOSPITAL_COMMUNITY): Payer: Self-pay

## 2021-07-21 NOTE — Telephone Encounter (Signed)
Ok to refill this time. She needs to let us know if she uses Epipen. There should be no need for frequent refills.

## 2021-07-21 NOTE — Telephone Encounter (Signed)
Please advise on this if we need to approve this or deny this.

## 2021-07-23 DIAGNOSIS — F332 Major depressive disorder, recurrent severe without psychotic features: Secondary | ICD-10-CM | POA: Diagnosis not present

## 2021-07-23 DIAGNOSIS — F4310 Post-traumatic stress disorder, unspecified: Secondary | ICD-10-CM | POA: Diagnosis not present

## 2021-07-23 DIAGNOSIS — F5101 Primary insomnia: Secondary | ICD-10-CM | POA: Diagnosis not present

## 2021-07-23 DIAGNOSIS — M25521 Pain in right elbow: Secondary | ICD-10-CM | POA: Diagnosis not present

## 2021-07-28 ENCOUNTER — Inpatient Hospital Stay: Admission: RE | Admit: 2021-07-28 | Payer: Medicaid Other | Source: Ambulatory Visit

## 2021-08-03 ENCOUNTER — Other Ambulatory Visit: Payer: Self-pay | Admitting: Pharmacy Technician

## 2021-08-04 DIAGNOSIS — M25621 Stiffness of right elbow, not elsewhere classified: Secondary | ICD-10-CM | POA: Diagnosis not present

## 2021-08-04 DIAGNOSIS — T148XXA Other injury of unspecified body region, initial encounter: Secondary | ICD-10-CM | POA: Diagnosis not present

## 2021-08-04 DIAGNOSIS — M25521 Pain in right elbow: Secondary | ICD-10-CM | POA: Diagnosis not present

## 2021-08-04 DIAGNOSIS — M25631 Stiffness of right wrist, not elsewhere classified: Secondary | ICD-10-CM | POA: Diagnosis not present

## 2021-08-04 DIAGNOSIS — M79641 Pain in right hand: Secondary | ICD-10-CM | POA: Diagnosis not present

## 2021-08-05 DIAGNOSIS — Z419 Encounter for procedure for purposes other than remedying health state, unspecified: Secondary | ICD-10-CM | POA: Diagnosis not present

## 2021-08-14 DIAGNOSIS — R7301 Impaired fasting glucose: Secondary | ICD-10-CM | POA: Diagnosis not present

## 2021-08-27 DIAGNOSIS — M170 Bilateral primary osteoarthritis of knee: Secondary | ICD-10-CM | POA: Diagnosis not present

## 2021-08-29 ENCOUNTER — Other Ambulatory Visit: Payer: Self-pay | Admitting: Obstetrics

## 2021-08-29 DIAGNOSIS — B3731 Acute candidiasis of vulva and vagina: Secondary | ICD-10-CM

## 2021-08-31 ENCOUNTER — Other Ambulatory Visit (HOSPITAL_COMMUNITY): Payer: Self-pay

## 2021-09-02 ENCOUNTER — Other Ambulatory Visit: Payer: Self-pay

## 2021-09-02 ENCOUNTER — Ambulatory Visit (INDEPENDENT_AMBULATORY_CARE_PROVIDER_SITE_OTHER): Payer: Medicaid Other

## 2021-09-02 VITALS — BP 158/68 | HR 111 | Temp 99.2°F | Resp 18 | Ht 60.0 in | Wt 186.8 lb

## 2021-09-02 DIAGNOSIS — J4550 Severe persistent asthma, uncomplicated: Secondary | ICD-10-CM

## 2021-09-02 DIAGNOSIS — Z419 Encounter for procedure for purposes other than remedying health state, unspecified: Secondary | ICD-10-CM | POA: Diagnosis not present

## 2021-09-02 MED ORDER — FAMOTIDINE IN NACL 20-0.9 MG/50ML-% IV SOLN: 20.0000 mg | Freq: Once | INTRAVENOUS | Status: DC | PRN

## 2021-09-02 MED ORDER — BENRALIZUMAB 30 MG/ML ~~LOC~~ SOSY
30.0000 mg | PREFILLED_SYRINGE | Freq: Once | SUBCUTANEOUS | Status: AC
  Administered 2021-09-02: 30 mg via SUBCUTANEOUS
  Filled 2021-09-02: qty 1

## 2021-09-02 MED ORDER — ALBUTEROL SULFATE HFA 108 (90 BASE) MCG/ACT IN AERS: 2.0000 | INHALATION_SPRAY | Freq: Once | RESPIRATORY_TRACT | Status: DC | PRN

## 2021-09-02 MED ORDER — METHYLPREDNISOLONE SODIUM SUCC 125 MG IJ SOLR: 125.0000 mg | Freq: Once | INTRAMUSCULAR | Status: DC | PRN

## 2021-09-02 MED ORDER — EPINEPHRINE 0.3 MG/0.3ML IJ SOAJ: 0.3000 mg | Freq: Once | INTRAMUSCULAR | Status: DC | PRN

## 2021-09-02 MED ORDER — SODIUM CHLORIDE 0.9 % IV SOLN: Freq: Once | INTRAVENOUS | Status: DC | PRN

## 2021-09-02 MED ORDER — DIPHENHYDRAMINE HCL 50 MG/ML IJ SOLN: 50.0000 mg | Freq: Once | INTRAMUSCULAR | Status: DC | PRN

## 2021-09-02 NOTE — Progress Notes (Signed)
Diagnosis: Asthma  Provider:  Praveen Mannam, MD  Procedure: Injection  Fasenra (Benralizumab), Dose: 30 mg, Site: subcutaneous, Number of injections: 1  Discharge: Condition: Good, Destination: Home . AVS provided to patient.   Performed by:  Marri Mcneff, RN       

## 2021-09-03 ENCOUNTER — Other Ambulatory Visit: Payer: Self-pay | Admitting: Obstetrics

## 2021-09-03 DIAGNOSIS — R52 Pain, unspecified: Secondary | ICD-10-CM
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
## 2021-09-15 ENCOUNTER — Other Ambulatory Visit: Payer: Self-pay

## 2021-09-15 ENCOUNTER — Encounter: Payer: Self-pay | Admitting: Internal Medicine

## 2021-09-15 ENCOUNTER — Ambulatory Visit (INDEPENDENT_AMBULATORY_CARE_PROVIDER_SITE_OTHER): Payer: Medicaid Other | Admitting: Internal Medicine

## 2021-09-15 VITALS — BP 138/80 | HR 88 | Temp 98.6°F | Ht 60.0 in | Wt 186.6 lb

## 2021-09-15 DIAGNOSIS — J4551 Severe persistent asthma with (acute) exacerbation: Secondary | ICD-10-CM | POA: Diagnosis not present

## 2021-09-15 DIAGNOSIS — J9611 Chronic respiratory failure with hypoxia: Secondary | ICD-10-CM | POA: Diagnosis not present

## 2021-09-15 DIAGNOSIS — J4550 Severe persistent asthma, uncomplicated: Secondary | ICD-10-CM | POA: Diagnosis not present

## 2021-09-15 MED ORDER — IPRATROPIUM-ALBUTEROL 0.5-2.5 (3) MG/3ML IN SOLN: 3.0000 mL | RESPIRATORY_TRACT | 12 refills | Status: DC | PRN

## 2021-09-15 MED ORDER — ALBUTEROL SULFATE HFA 108 (90 BASE) MCG/ACT IN AERS: INHALATION_SPRAY | RESPIRATORY_TRACT | 12 refills | Status: DC

## 2021-09-15 MED ORDER — MONTELUKAST SODIUM 10 MG PO TABS
10.0000 mg | ORAL_TABLET | Freq: Every day | ORAL | 4 refills | Status: DC
Start: 2021-09-15 — End: 2022-10-12

## 2021-09-15 NOTE — Assessment & Plan Note (Signed)
Kelly Butler is helpful but she still has breakthrough events.  Most recent episode might have been related to spring pollens.  We will watch this. ?Plan-refill meds today. ?

## 2021-09-15 NOTE — Assessment & Plan Note (Signed)
Benefits from O2 2 L for sleep.  We will going to do a walk test for portable oxygen consideration today but oximeter was not functioning.  We will try to arrange O2 qualifying walk test. ?

## 2021-09-15 NOTE — Patient Instructions (Signed)
Refills sent for Singulair, albuterol rescue inhaler and Duoneb neb solution ? ?Order- Walk test POC qualifying O2   dx Asthma severe persistent ? ?Please call if we can help ?

## 2021-09-21 ENCOUNTER — Other Ambulatory Visit: Payer: Self-pay | Admitting: Internal Medicine

## 2021-09-24 ENCOUNTER — Other Ambulatory Visit: Payer: Self-pay | Admitting: Internal Medicine

## 2021-09-28 ENCOUNTER — Other Ambulatory Visit: Payer: Self-pay | Admitting: Obstetrics

## 2021-09-28 DIAGNOSIS — B3731 Acute candidiasis of vulva and vagina: Secondary | ICD-10-CM

## 2021-10-03 DIAGNOSIS — Z419 Encounter for procedure for purposes other than remedying health state, unspecified: Secondary | ICD-10-CM | POA: Diagnosis not present

## 2021-10-10 ENCOUNTER — Other Ambulatory Visit: Payer: Self-pay | Admitting: Internal Medicine

## 2021-10-10 ENCOUNTER — Other Ambulatory Visit: Payer: Self-pay | Admitting: Cardiovascular Disease

## 2021-10-12 DIAGNOSIS — F332 Major depressive disorder, recurrent severe without psychotic features: Secondary | ICD-10-CM | POA: Diagnosis not present

## 2021-10-12 DIAGNOSIS — F4310 Post-traumatic stress disorder, unspecified: Secondary | ICD-10-CM | POA: Diagnosis not present

## 2021-10-12 DIAGNOSIS — F5101 Primary insomnia: Secondary | ICD-10-CM | POA: Diagnosis not present

## 2021-10-12 NOTE — Telephone Encounter (Signed)
Ambien refilled

## 2021-10-12 NOTE — Telephone Encounter (Signed)
Dr. [name], please advise on refill request. ? ?Allergies  ?Allergen Reactions  ? Celebrex [Celecoxib] Swelling and Rash  ? Pregabalin Other (See Comments), Rash and Swelling  ?  *LYRICA* REACTION: itching, swelling ?*LYRICA* REACTION: itching, swelling ?*LYRICA* REACTION: itching, swelling  ? Sulfa Antibiotics Other (See Comments)  ? ? ? ?Current Outpatient Medications:  ?  albuterol (PROAIR HFA) 108 (90 Base) MCG/ACT inhaler, TAKE 2 PUFFS EVERY 4 HOURS AS NEEDED -RESCUE, Disp: 8.5 each, Rfl: 12 ?  ALPRAZolam (XANAX) 0.5 MG tablet, Take 0.5 mg by mouth 3 (three) times daily as needed (anxiety). Anxiety., Disp: , Rfl:  ?  ANORO ELLIPTA 62.5-25 MCG/ACT AEPB, INHALE 1 PUFF BY MOUTH EVERY DAY, Disp: 60 each, Rfl: 12 ?  Benralizumab (FASENRA PEN) 30 MG/ML SOAJ, INJECT 30 MG INTO THE SKIN EVERY 8 (EIGHT) WEEKS. DELIVERY TO [facility]: [address], [address], [place], [place] [address], Disp: 1 mL, Rfl: 2 ?  clotrimazole-betamethasone (LOTRISONE) cream, APPLY TO AFFECTED AREA TWICE A DAY, Disp: 45 g, Rfl: 1 ?  EPINEPHrine 0.3 mg/0.3 mL IJ SOAJ injection, INJECT INTO THE THIGH ONCE FOR SEVERE ALLERGIC REACTION., Disp: 2 each, Rfl: 0 ?  fluconazole (DIFLUCAN) 150 MG tablet, TAKE 1 TABLET BY MOUTH AS ONE DOSE, Disp: 1 tablet, Rfl: 0 ?  ibuprofen (ADVIL) 800 MG tablet, TAKE 1 TABLET BY MOUTH EVERY 8 HOURS AS NEEDED, Disp: 30 tablet, Rfl: 5 ?  ipratropium-albuterol (DUONEB) 0.5-2.5 (3) MG/3ML SOLN, Inhale 3 mLs into the lungs every 4 (four) hours as needed (asthma). USE 1 VIAL VIA NEBULIZER EVERY 4 HRS AS NEEDED, Disp: 360 mL, Rfl: 12 ?  isosorbide mononitrate (IMDUR) 30 MG 24 hr tablet, Take 30 mg by mouth daily., Disp: , Rfl:  ?  losartan (COZAAR) 100 MG tablet, TAKE 1 TABLET BY MOUTH EVERY DAY, Disp: 90 tablet, Rfl: 1 ?  metoprolol tartrate (LOPRESSOR) 25 MG tablet, TAKE 1 TABLET BY MOUTH TWICE A DAY, Disp: 180 tablet, Rfl: 2 ?  montelukast (SINGULAIR) 10 MG tablet, Take 1 tablet (10 mg total) by mouth daily.,  Disp: 90 tablet, Rfl: 4 ?  omeprazole (PRILOSEC) 40 MG capsule, Take 40 mg by mouth 2 (two) times daily., Disp: , Rfl:  ?  ondansetron (ZOFRAN) 4 MG tablet, Take 4 mg by mouth every 8 (eight) hours as needed for nausea or vomiting. , Disp: , Rfl: 5 ?  spironolactone (ALDACTONE) 25 MG tablet, TAKE 1 TABLET BY MOUTH EVERY DAY, Disp: 90 tablet, Rfl: 2 ?  torsemide (DEMADEX) 20 MG tablet, TAKE 1 TABLET BY MOUTH TWICE A DAY, Disp: 180 tablet, Rfl: 1 ? ?

## 2021-10-20 ENCOUNTER — Other Ambulatory Visit (HOSPITAL_COMMUNITY): Payer: Self-pay

## 2021-10-21 ENCOUNTER — Other Ambulatory Visit (HOSPITAL_COMMUNITY): Payer: Self-pay

## 2021-10-26 ENCOUNTER — Telehealth: Payer: Self-pay | Admitting: Pharmacist

## 2021-10-26 ENCOUNTER — Other Ambulatory Visit (HOSPITAL_COMMUNITY): Payer: Self-pay

## 2021-10-26 ENCOUNTER — Other Ambulatory Visit: Payer: Self-pay | Admitting: Internal Medicine

## 2021-10-26 DIAGNOSIS — J4551 Severe persistent asthma with (acute) exacerbation: Secondary | ICD-10-CM

## 2021-10-26 DIAGNOSIS — J4540 Moderate persistent asthma, uncomplicated: Secondary | ICD-10-CM

## 2021-10-26 MED ORDER — FASENRA PEN 30 MG/ML ~~LOC~~ SOAJ
SUBCUTANEOUS | 2 refills | Status: DC
  Filled 2021-10-26: qty 1, 56d supply, fill #0
  Filled 2021-12-16: qty 1, 56d supply, fill #1
  Filled 2022-03-15: qty 1, 56d supply, fill #2

## 2021-10-26 NOTE — Telephone Encounter (Signed)
Received notification from [facility] regarding Chicken refill. Pharmacy staff notified us that patient refused to confirm name and DOB as is required by pharmacy to dispense medication and courier to infusion center. I reached out to patient today to help troubleshoot so that medication could be sent to the clinic before her appt on 10/28/21. ? ?Patient's tone of voice quickly escalated to yelling - she began yelling that she had confirmed this information multiple times and did not want to receive another call about this. She did not say to whom she provided this information to or when - when I inquired if it was last week, she did not confirm and continued to yell at me that she won't take it anymore then. I advised that the pharmacy is required to confirm this information with each fill to prevent medication errors. She stated that "if they won't ship it, then I won't take it anymore." ? ?I sent e-mail to [facility] advising that they can fill her [name] for appt on 10/28/21 however if patient becomes verbally aggressive on the phone again, they should notify us ASAP as her yelling at anyone on the care team including the pharmacy is unwarranted and unacceptable behavior. ? ?Knox Saliva, PharmD, MPH, BCPS ?Clinical Pharmacist (Rheumatology and Pulmonology) ?

## 2021-10-26 NOTE — Telephone Encounter (Signed)
Refill sent for [facility] to [facility]: [phone]  ? ?Dose: 30 mg SQ every 8 weeks ? ?Last OV: 09/15/21 ?Provider: Dr. [name] ? ?Next OV: not yet scheduled - should be around 03/18/22 ? ?Knox Saliva, PharmD, MPH, BCPS ?Clinical Pharmacist (Rheumatology and Pulmonology) ? ?

## 2021-10-27 ENCOUNTER — Other Ambulatory Visit (HOSPITAL_COMMUNITY): Payer: Self-pay

## 2021-10-28 ENCOUNTER — Ambulatory Visit: Payer: Medicaid Other

## 2021-11-02 DIAGNOSIS — Z419 Encounter for procedure for purposes other than remedying health state, unspecified: Secondary | ICD-10-CM | POA: Diagnosis not present

## 2021-11-12 ENCOUNTER — Encounter: Payer: Self-pay | Admitting: Internal Medicine

## 2021-11-16 ENCOUNTER — Ambulatory Visit: Payer: Medicaid Other

## 2021-11-17 ENCOUNTER — Other Ambulatory Visit: Payer: Self-pay | Admitting: Internal Medicine

## 2021-11-24 ENCOUNTER — Ambulatory Visit: Payer: Medicaid Other

## 2021-11-25 ENCOUNTER — Ambulatory Visit (INDEPENDENT_AMBULATORY_CARE_PROVIDER_SITE_OTHER): Payer: Medicaid Other

## 2021-11-25 VITALS — BP 176/74 | HR 86 | Temp 99.5°F | Resp 18 | Ht 60.0 in | Wt 178.6 lb

## 2021-11-25 DIAGNOSIS — J4550 Severe persistent asthma, uncomplicated: Secondary | ICD-10-CM | POA: Diagnosis not present

## 2021-11-25 MED ORDER — BENRALIZUMAB 30 MG/ML ~~LOC~~ SOSY
30.0000 mg | PREFILLED_SYRINGE | Freq: Once | SUBCUTANEOUS | Status: AC
  Administered 2021-11-25: 30 mg via SUBCUTANEOUS
  Filled 2021-11-25: qty 1

## 2021-11-25 NOTE — Progress Notes (Signed)
Diagnosis: Asthma  Provider:  Marshell Garfinkel, MD  Procedure: Injection  Benralizumab [name]), Dose: 30 mg, Site: subcutaneous, Number of injections: 1  Discharge: Condition: Good, Destination: Home . AVS provided to patient.   Performed by:  Cleophus Molt, RN

## 2021-11-29 ENCOUNTER — Other Ambulatory Visit: Payer: Self-pay | Admitting: Internal Medicine

## 2021-12-03 ENCOUNTER — Ambulatory Visit: Payer: Medicaid Other | Admitting: Physician Assistant

## 2021-12-03 DIAGNOSIS — Z419 Encounter for procedure for purposes other than remedying health state, unspecified: Secondary | ICD-10-CM | POA: Diagnosis not present

## 2021-12-10 ENCOUNTER — Telehealth: Payer: Self-pay | Admitting: Internal Medicine

## 2021-12-10 DIAGNOSIS — M170 Bilateral primary osteoarthritis of knee: Secondary | ICD-10-CM | POA: Diagnosis not present

## 2021-12-10 DIAGNOSIS — J4551 Severe persistent asthma with (acute) exacerbation: Secondary | ICD-10-CM

## 2021-12-10 NOTE — Telephone Encounter (Signed)
Please advise if ok to send in a new order for a nebulizer machine?

## 2021-12-11 NOTE — Telephone Encounter (Signed)
Place new nebulizer machine order through [employer]. Placing order as urgent because pt's machine has not worked since yesterday. Pt was updated. Nothing further needed at this time.

## 2021-12-11 NOTE — Telephone Encounter (Signed)
Ok to replace compressor nebulizer

## 2021-12-14 ENCOUNTER — Other Ambulatory Visit (HOSPITAL_COMMUNITY): Payer: Self-pay

## 2021-12-15 DIAGNOSIS — J4551 Severe persistent asthma with (acute) exacerbation: Secondary | ICD-10-CM | POA: Diagnosis not present

## 2021-12-16 ENCOUNTER — Other Ambulatory Visit (HOSPITAL_COMMUNITY): Payer: Self-pay

## 2021-12-21 ENCOUNTER — Other Ambulatory Visit (HOSPITAL_COMMUNITY): Payer: Self-pay

## 2021-12-22 ENCOUNTER — Other Ambulatory Visit: Payer: Self-pay | Admitting: Obstetrics

## 2021-12-22 DIAGNOSIS — B356 Tinea cruris: Secondary | ICD-10-CM

## 2021-12-28 DIAGNOSIS — F4310 Post-traumatic stress disorder, unspecified: Secondary | ICD-10-CM | POA: Diagnosis not present

## 2021-12-28 DIAGNOSIS — F5101 Primary insomnia: Secondary | ICD-10-CM | POA: Diagnosis not present

## 2021-12-28 DIAGNOSIS — F332 Major depressive disorder, recurrent severe without psychotic features: Secondary | ICD-10-CM | POA: Diagnosis not present

## 2021-12-29 ENCOUNTER — Ambulatory Visit: Payer: Medicaid Other | Admitting: Physician Assistant

## 2022-01-02 DIAGNOSIS — Z419 Encounter for procedure for purposes other than remedying health state, unspecified: Secondary | ICD-10-CM | POA: Diagnosis not present

## 2022-01-06 ENCOUNTER — Other Ambulatory Visit (HOSPITAL_COMMUNITY): Payer: Self-pay

## 2022-01-07 ENCOUNTER — Other Ambulatory Visit (HOSPITAL_COMMUNITY): Payer: Self-pay

## 2022-01-08 ENCOUNTER — Telehealth: Payer: Self-pay | Admitting: Pharmacist

## 2022-01-08 NOTE — Telephone Encounter (Signed)
Submitted a Prior Authorization RENEWAL request to [facility] Medicaid for [facility] via CoverMyMeds. Will update once we receive a response.  Key: [id number]  Knox Saliva, PharmD, MPH, BCPS, CPP Clinical Pharmacist (Rheumatology and Pulmonology)

## 2022-01-11 NOTE — Telephone Encounter (Signed)
Received notification from [facility] regarding a prior authorization for [facility]. Authorization has been APPROVED from 01/08/22 to 01/08/23.   Patient can continue to fill through [place]: [phone]   Authorization # [id number]  Knox Saliva, PharmD, MPH, BCPS, CPP Clinical Pharmacist (Rheumatology and Pulmonology)

## 2022-01-15 ENCOUNTER — Other Ambulatory Visit (HOSPITAL_COMMUNITY): Payer: Self-pay

## 2022-01-15 ENCOUNTER — Ambulatory Visit: Payer: Medicaid Other | Admitting: Physician Assistant

## 2022-01-18 ENCOUNTER — Other Ambulatory Visit (HOSPITAL_COMMUNITY): Payer: Self-pay

## 2022-01-20 ENCOUNTER — Ambulatory Visit (INDEPENDENT_AMBULATORY_CARE_PROVIDER_SITE_OTHER): Payer: Medicaid Other

## 2022-01-20 ENCOUNTER — Other Ambulatory Visit: Payer: Self-pay | Admitting: Obstetrics

## 2022-01-20 VITALS — BP 171/88 | HR 73 | Temp 98.9°F | Resp 18 | Ht 60.0 in | Wt 171.8 lb

## 2022-01-20 DIAGNOSIS — J4550 Severe persistent asthma, uncomplicated: Secondary | ICD-10-CM

## 2022-01-20 DIAGNOSIS — R52 Pain, unspecified: Secondary | ICD-10-CM

## 2022-01-20 MED ORDER — BENRALIZUMAB 30 MG/ML ~~LOC~~ SOSY
30.0000 mg | PREFILLED_SYRINGE | Freq: Once | SUBCUTANEOUS | Status: AC
  Administered 2022-01-20: 30 mg via SUBCUTANEOUS
  Filled 2022-01-20: qty 1

## 2022-01-20 NOTE — Progress Notes (Signed)
Diagnosis: Asthma  Provider:  Marshell Garfinkel, MD  Procedure: Injection  Fasenra (Benralizumab), Dose: 30 mg, Site: subcutaneous, Number of injections: 1  Discharge: Condition: Good, Destination: Home . AVS provided to patient.   Performed by:  Cleophus Molt, RN

## 2022-01-25 ENCOUNTER — Ambulatory Visit: Payer: Medicaid Other | Admitting: Physician Assistant

## 2022-01-26 ENCOUNTER — Other Ambulatory Visit: Payer: Self-pay | Admitting: Obstetrics & Gynecology

## 2022-01-26 DIAGNOSIS — B356 Tinea cruris: Secondary | ICD-10-CM
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
## 2022-01-28 ENCOUNTER — Ambulatory Visit: Payer: Medicaid Other | Admitting: Physician Assistant

## 2022-01-28 DIAGNOSIS — R072 Precordial pain: Secondary | ICD-10-CM

## 2022-01-28 DIAGNOSIS — I10 Essential (primary) hypertension: Secondary | ICD-10-CM

## 2022-01-28 DIAGNOSIS — J4540 Moderate persistent asthma, uncomplicated: Secondary | ICD-10-CM

## 2022-01-28 DIAGNOSIS — I5020 Unspecified systolic (congestive) heart failure: Secondary | ICD-10-CM

## 2022-01-28 DIAGNOSIS — R002 Palpitations: Secondary | ICD-10-CM

## 2022-02-02 ENCOUNTER — Other Ambulatory Visit: Payer: Self-pay | Admitting: Cardiovascular Disease

## 2022-02-02 DIAGNOSIS — Z419 Encounter for procedure for purposes other than remedying health state, unspecified: Secondary | ICD-10-CM | POA: Diagnosis not present
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
## 2022-02-10 ENCOUNTER — Encounter: Payer: Self-pay | Admitting: Physician Assistant

## 2022-02-10 ENCOUNTER — Ambulatory Visit (INDEPENDENT_AMBULATORY_CARE_PROVIDER_SITE_OTHER): Payer: Medicaid Other | Admitting: Physician Assistant

## 2022-02-10 VITALS — BP 132/74 | HR 87 | Ht 60.0 in | Wt 165.6 lb

## 2022-02-10 DIAGNOSIS — R0602 Shortness of breath: Secondary | ICD-10-CM

## 2022-02-10 DIAGNOSIS — R072 Precordial pain: Secondary | ICD-10-CM | POA: Diagnosis not present

## 2022-02-10 DIAGNOSIS — J4540 Moderate persistent asthma, uncomplicated: Secondary | ICD-10-CM | POA: Diagnosis not present

## 2022-02-10 DIAGNOSIS — I5020 Unspecified systolic (congestive) heart failure: Secondary | ICD-10-CM | POA: Diagnosis not present

## 2022-02-10 DIAGNOSIS — R002 Palpitations: Secondary | ICD-10-CM | POA: Diagnosis not present

## 2022-02-10 DIAGNOSIS — I10 Essential (primary) hypertension: Secondary | ICD-10-CM

## 2022-02-10 MED ORDER — ISOSORBIDE MONONITRATE ER 60 MG PO TB24: 60.0000 mg | ORAL_TABLET | Freq: Every day | ORAL | 3 refills | Status: DC

## 2022-02-10 NOTE — Patient Instructions (Signed)
Medication Instructions:  1.Increase isosorbide mononitrate (Imdur) to 60 mg daily *If you need a refill on your cardiac medications before your next appointment, please call your pharmacy*   Lab Work: None If you have labs (blood work) drawn today and your tests are completely normal, you will receive your results only by: [place] (if you have MyChart) OR A paper copy in the mail If you have any lab test that is abnormal or we need to change your treatment, we will call you to review the results.  Follow-Up: At [facility], you and your health needs are our priority.  As part of our continuing mission to provide you with exceptional heart care, we have created designated Provider Care Teams.  These Care Teams include your primary Cardiologist (physician) and Advanced Practice Providers (APPs -  Physician Assistants and Nurse Practitioners) who all work together to provide you with the care you need, when you need it.   Your next appointment:   3 month(s)  The format for your next appointment:   In Person  Provider:   Jenkins Rouge, MD or APP  Other Instructions 1.Wear compression hose during the day to help eliminate lower extremity swelling 2.Call and let us know if you decide to proceed with the CT as discussed at today's visit  Important Information About Sugar

## 2022-02-16 ENCOUNTER — Telehealth: Payer: Self-pay | Admitting: Cardiovascular Disease

## 2022-02-16 ENCOUNTER — Telehealth: Payer: Self-pay | Admitting: Physician Assistant

## 2022-02-16 NOTE — Telephone Encounter (Signed)
Patient called to follow-up on her OV and stated she would like to have the MRI test done.

## 2022-02-16 NOTE — Telephone Encounter (Signed)
    Pre-operative Risk Assessment    Patient Name: Kelly Butler  DOB: May 27, 1961 MRN: [id number]      Request for Surgical Clearance    Procedure:   Left knee surgery  Date of Surgery:  Clearance TBD                                 Surgeon:    Dr. Arvilla Meres Group or Practice Name:  [facility] Phone number:  Not provided Fax number:  Not provided   Type of Clearance Requested:   - Medical    Type of Anesthesia:  Not Indicated   Additional requests/questions:    Patient stated she had brought in paperwork to the office to get cardiac clearance.  Signed, Heloise Beecham   02/16/2022, 3:54 PM

## 2022-02-16 NOTE — Telephone Encounter (Signed)
   Name: Kelly Butler  DOB: 07/09/1960  MRN: [id number]  Primary Cardiologist: Jenkins Rouge, MD  Chart reviewed as part of pre-operative protocol coverage. Because of [name]'s past medical history and time since last visit, she will require a follow-up in-office visit in order to better assess preoperative cardiovascular risk.  Recent OV on 02/10/2022 patient reported chest discomfort.  Follow-up testing was recommended.  Pre-op covering staff: - Please schedule appointment and call patient to inform them. If patient already had an upcoming appointment within acceptable timeframe, please add "pre-op clearance" to the appointment notes so provider is aware. - Please contact requesting surgeon's office via preferred method (i.e, phone, fax) to inform them of need for appointment prior to surgery.   Lenna Sciara, NP  02/16/2022, 5:08 PM

## 2022-02-17 NOTE — Telephone Encounter (Signed)
Called patient to let her know [name] PA was suggesting either a cardiac CT or stress test. Patient stated she had someone call her stating that she would need an office visit for clearance for surgery. Patient stated she just saw someone. Patient stated she was wanting the test were the heart is checked for blockage. Informed her that she is probably referring to the CT. Informed patient that the CT could be ordered, but we might need lab work due to IV contrast. At this point patient stated she did not want to do anything.

## 2022-02-17 NOTE — Telephone Encounter (Signed)
   Patient Name: Kelly Butler  DOB: May 09, 1961 MRN: [id number]  Primary Cardiologist: Jenkins Rouge, MD  Chart reviewed as part of pre-operative protocol coverage.  It was recommended at most recent office visit that patient undergo ischemic evaluation prior to providing surgical clearance.  Patient has declined ischemic evaluation.  Therefore, cardiology is unable to provide clearance for surgery at this time.  Preop coverage team, please notify patient and requesting party.  I will remove this request from the preop coverage pool.   Thank you.  Lenna Sciara, NP 02/17/2022, 4:26 PM

## 2022-02-17 NOTE — Telephone Encounter (Signed)
   Patient Name: Kelly Butler  DOB: [birth year] MRN: [id number]  Primary Cardiologist: Jenkins Rouge, MD  Chart reviewed as part of pre-operative protocol coverage.   Received the following message from [place], [place] regarding preop clearance:   "So, this patient was just seen by me so she doesn't need another office visit. She needs a coronary CT scan before clearance can be granted and we have been trying to arrange but patient has been resistant."  Preop coverage team, please clarify with Sharolyn Douglas, [place] arrangements for coronary CTA.   Patient will not be cleared for surgery prior to ischemic evaluation.  Thank you.   Lenna Sciara, NP 02/17/2022, 3:38 PM

## 2022-02-17 NOTE — Telephone Encounter (Signed)
1st attempt to reach pt regarding surgical clearance and the need for an in-office appointment.  Left a message for pt to call back and get that scheduled.

## 2022-02-17 NOTE — Telephone Encounter (Signed)
It is documented today, pt has refused the testing that has been recommended.  Will send back to preop to address.

## 2022-02-18 NOTE — Telephone Encounter (Signed)
Routed surgical clearance request to [facility] to fax# [phone]

## 2022-02-24 DIAGNOSIS — R110 Nausea: Secondary | ICD-10-CM | POA: Diagnosis not present

## 2022-02-24 DIAGNOSIS — M549 Dorsalgia, unspecified: Secondary | ICD-10-CM | POA: Diagnosis not present

## 2022-02-24 DIAGNOSIS — R140 Abdominal distension (gaseous): Secondary | ICD-10-CM | POA: Diagnosis not present

## 2022-02-24 DIAGNOSIS — G8929 Other chronic pain: Secondary | ICD-10-CM | POA: Diagnosis not present

## 2022-02-24 DIAGNOSIS — M79601 Pain in right arm: Secondary | ICD-10-CM | POA: Diagnosis not present

## 2022-02-24 DIAGNOSIS — M545 Low back pain, unspecified: Secondary | ICD-10-CM | POA: Diagnosis not present

## 2022-03-05 ENCOUNTER — Other Ambulatory Visit (HOSPITAL_COMMUNITY): Payer: Self-pay

## 2022-03-05 DIAGNOSIS — Z419 Encounter for procedure for purposes other than remedying health state, unspecified: Secondary | ICD-10-CM | POA: Diagnosis not present
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
## 2022-03-09 ENCOUNTER — Ambulatory Visit: Payer: Medicaid Other | Admitting: Internal Medicine

## 2022-03-09 ENCOUNTER — Other Ambulatory Visit (HOSPITAL_COMMUNITY): Payer: Self-pay

## 2022-03-12 ENCOUNTER — Telehealth: Payer: Self-pay | Admitting: Cardiovascular Disease

## 2022-03-12 MED ORDER — NITROGLYCERIN 0.4 MG SL SUBL: SUBLINGUAL_TABLET | SUBLINGUAL | 3 refills | Status: DC

## 2022-03-12 NOTE — Telephone Encounter (Signed)
Josue Hector, MD to Me     03/12/22 11:36 AM Patient is being irresponsible notifying us but not following instructions can call in SL nitro.  Called patient and left message that nitro was being sent in and to call if she had any questions.

## 2022-03-12 NOTE — Telephone Encounter (Signed)
.  Pt c/o of Chest Pain: STAT if CP now or developed within 24 hours  1. Are you having CP right now? No  2. Are you experiencing any other symptoms (ex. SOB, nausea, vomiting, sweating)? Has had these symptoms for a few days  3. How long have you been experiencing CP? Three weeks  4. Is your CP continuous or coming and going? Comes and goes  5. Have you taken Nitroglycerin?  No  Pt c/o swelling: STAT is pt has developed SOB within 24 hours  If swelling, where is the swelling located? Right ankle  How much weight have you gained and in what time span? No  Have you gained 3 pounds in a day or 5 pounds in a week? No  Do you have a log of your daily weights (if so, list)? No  Are you currently taking a fluid pill?  Yes  Are you currently SOB? No  Have you traveled recently? No   Patient stated she is having numbness in right side and her leg has stayed numb and has a heaviness to it.  ?

## 2022-03-12 NOTE — Telephone Encounter (Signed)
Called patient about her symptoms. Patient stated she has been having chest pain off and on for 3 weeks, patient is on isosorbide mononitrate, not sure if patient would need nitroglycerin sublingual as well. Patient also has been having numbness and heaviness in her right leg for several months, and stated it just has gotten worse today. Informed patient of signs and symptoms of stroke and that she really needs to get evaluated for all her symptoms. Patient stated she just needed something to take to make her symptoms go away. Informed patient that there is nitroglycerin that helps with chest pain that is fast acting, and informed her that her imdur does the same thing but over a longer period of time. Tired to get patient to get patient to come in and see someone today, and if not go to the ED. Patient refused both stating she does not have time and has too much to do. Patient stated she will go home tonight and get some rest and if she does not feel better then she will go to ED. Tired to get patient to see someone next week, but patient refused and stated she just wants to let someone know what is going on. Will forward to Dr. [name] for further advisement.

## 2022-03-15 ENCOUNTER — Other Ambulatory Visit (HOSPITAL_COMMUNITY): Payer: Self-pay

## 2022-03-17 ENCOUNTER — Telehealth: Payer: Self-pay | Admitting: Physician Assistant

## 2022-03-17 ENCOUNTER — Ambulatory Visit: Payer: Medicaid Other

## 2022-03-17 NOTE — Telephone Encounter (Signed)
..   Medicaid Managed Care   Unsuccessful Outreach Note  03/17/2022 Name: Tenise Stetler MRN: [id number] DOB: 08/22/1960  Referred by: [name], PA-C Reason for referral : High Risk Managed Medicaid (I called the patient today to get her scheduled with the MM Team. I left my name and number on her VM.)   An unsuccessful telephone outreach was attempted today. The patient was referred to the case management team for assistance with care management and care coordination.   Follow Up Plan: The care management team will reach out to the patient again over the next 14 days.    [place]

## 2022-03-18 ENCOUNTER — Ambulatory Visit (INDEPENDENT_AMBULATORY_CARE_PROVIDER_SITE_OTHER): Payer: Medicaid Other | Admitting: *Deleted

## 2022-03-18 VITALS — BP 156/88 | HR 71 | Temp 99.1°F | Resp 16 | Ht 60.0 in | Wt 166.2 lb

## 2022-03-18 DIAGNOSIS — J4550 Severe persistent asthma, uncomplicated: Secondary | ICD-10-CM | POA: Diagnosis not present

## 2022-03-18 MED ORDER — BENRALIZUMAB 30 MG/ML ~~LOC~~ SOSY
30.0000 mg | PREFILLED_SYRINGE | Freq: Once | SUBCUTANEOUS | Status: AC
  Administered 2022-03-18: 30 mg via SUBCUTANEOUS
  Filled 2022-03-18: qty 1

## 2022-03-18 NOTE — Progress Notes (Signed)
Diagnosis: Asthma  Provider:  Marshell Garfinkel MD  Procedure: Injection  Fasenra (Benralizumab), Dose: 30 mg, Site: subcutaneous, Number of injections: 1  Discharge: Condition: Good, Destination: Home . AVS provided to patient.   Performed by:  Oren Beckmann, RN

## 2022-03-25 ENCOUNTER — Other Ambulatory Visit: Payer: Self-pay | Admitting: Cardiovascular Disease

## 2022-03-31 ENCOUNTER — Other Ambulatory Visit (HOSPITAL_COMMUNITY): Payer: Self-pay

## 2022-04-01 ENCOUNTER — Telehealth: Payer: Self-pay | Admitting: Pharmacy Technician

## 2022-04-01 NOTE — Telephone Encounter (Addendum)
Auth Submission: NO AUTH NEEDED 6 MONTH TRACKING Payer: [facility] MEDICAID Medication & CPT/J Code(s) submitted: Harrington Challenger Pharmacologist) [phone] Route of submission (phone, fax, portal):  Phone # [phone] Fax # Auth type: PHARMACY - [facility] Units/visits requested:  Reference number: [id number] Approval from: 03/12/22 to 07/05/23 at [facility] as PHARMACY     @yatin : Excell sheet and fyi flag has been updated

## 2022-04-04 DIAGNOSIS — Z419 Encounter for procedure for purposes other than remedying health state, unspecified: Secondary | ICD-10-CM | POA: Diagnosis not present

## 2022-04-05 ENCOUNTER — Telehealth: Payer: Self-pay | Admitting: Physician Assistant

## 2022-04-05 DIAGNOSIS — R072 Precordial pain: Secondary | ICD-10-CM

## 2022-04-05 NOTE — Telephone Encounter (Signed)
New Message:     Patient said her surgery was denied, because she would not take  some test that require using dye. She said she will take any test as long it does not require the use of dye. She can not take the dye,.She says it makes her so sick.Kelly Butler

## 2022-04-05 NOTE — Telephone Encounter (Signed)
Called patient back about message. Patient does not want to do the cardiac CT, due to feeling "funny" 3 days after test. Patient was agreeable to do lexiscan Myoview. Per [name], okay to order lexiscan. Placed order for test. Will send message to scheduling to see if test can be done sooner than later. Will send message through mychart of instructions for stress test.

## 2022-04-05 NOTE — Telephone Encounter (Signed)
Kelly Butler, would you please call patient to clarify this message. Also, I noted that you had talked with her recently about chest pain. Her surgical clearance is null and void if she is experiencing chest pain and she will need an in-person office visit.  Thank you, Sharyn Lull
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
## 2022-04-12 ENCOUNTER — Encounter: Payer: Self-pay | Admitting: Internal Medicine

## 2022-04-12 ENCOUNTER — Ambulatory Visit (INDEPENDENT_AMBULATORY_CARE_PROVIDER_SITE_OTHER): Payer: Medicaid Other

## 2022-04-12 ENCOUNTER — Other Ambulatory Visit: Payer: Self-pay | Admitting: Physician Assistant

## 2022-04-12 ENCOUNTER — Ambulatory Visit (INDEPENDENT_AMBULATORY_CARE_PROVIDER_SITE_OTHER): Payer: Medicaid Other | Admitting: Internal Medicine

## 2022-04-12 ENCOUNTER — Telehealth (HOSPITAL_COMMUNITY): Payer: Self-pay

## 2022-04-12 VITALS — BP 146/80 | HR 83 | Temp 98.7°F | Ht 60.0 in | Wt 166.0 lb

## 2022-04-12 DIAGNOSIS — J449 Chronic obstructive pulmonary disease, unspecified: Secondary | ICD-10-CM

## 2022-04-12 DIAGNOSIS — R0789 Other chest pain: Secondary | ICD-10-CM | POA: Diagnosis not present

## 2022-04-12 DIAGNOSIS — J4540 Moderate persistent asthma, uncomplicated: Secondary | ICD-10-CM

## 2022-04-12 DIAGNOSIS — Z23 Encounter for immunization: Secondary | ICD-10-CM | POA: Diagnosis not present

## 2022-04-12 MED ORDER — BREZTRI AEROSPHERE 160-9-4.8 MCG/ACT IN AERO: 2.0000 | INHALATION_SPRAY | Freq: Two times a day (BID) | RESPIRATORY_TRACT | 0 refills | Status: DC

## 2022-04-12 MED ORDER — IPRATROPIUM-ALBUTEROL 0.5-2.5 (3) MG/3ML IN SOLN: 3.0000 mL | RESPIRATORY_TRACT | 5 refills | Status: DC | PRN

## 2022-04-12 NOTE — Patient Instructions (Addendum)
Order- cxr    dx COPD mixed type  Order- flu vax- standard  Refill sent for nebulizer solution  Order- sample x 2 Breztri inhaler     inhale 2 puff, then rinse mouth, twice daily Try this instead of Anoro. We can change to [facility] if you like it better.

## 2022-04-12 NOTE — Assessment & Plan Note (Signed)
Fasenra not lasting. We will see how she does with Breztri samples. If no better consider change to Dupixent. Update CXR. Flu vax.

## 2022-04-12 NOTE — Assessment & Plan Note (Signed)
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 Looking forward to clarification by stress test if cardiology goes ahead.

## 2022-04-12 NOTE — Telephone Encounter (Signed)
Detailed instructions left on the patient's answering machine. Asked to call back with any questions. S.Jaslynn Thome EMTP 

## 2022-04-13 ENCOUNTER — Telehealth: Payer: Self-pay | Admitting: Internal Medicine

## 2022-04-13 ENCOUNTER — Encounter (HOSPITAL_COMMUNITY): Payer: Medicaid Other

## 2022-04-13 NOTE — Telephone Encounter (Signed)
Kelly Lever, MD  04/12/2022  4:35 PM EDT     CXR- stable with no obvious explanation for chest pain.  Called and spoke with patient. She verbalized understanding. Nothing further needed at time of call.

## 2022-04-14 ENCOUNTER — Ambulatory Visit: Payer: Medicaid Other | Admitting: Obstetrics

## 2022-04-15 ENCOUNTER — Other Ambulatory Visit: Payer: Self-pay | Admitting: Internal Medicine

## 2022-04-15 DIAGNOSIS — M25562 Pain in left knee: Secondary | ICD-10-CM | POA: Diagnosis not present

## 2022-04-15 DIAGNOSIS — G8929 Other chronic pain: Secondary | ICD-10-CM | POA: Diagnosis not present

## 2022-04-15 DIAGNOSIS — Z96651 Presence of right artificial knee joint: Secondary | ICD-10-CM | POA: Diagnosis not present

## 2022-04-16 DIAGNOSIS — H2513 Age-related nuclear cataract, bilateral: Secondary | ICD-10-CM | POA: Diagnosis not present

## 2022-04-16 DIAGNOSIS — H35033 Hypertensive retinopathy, bilateral: Secondary | ICD-10-CM | POA: Diagnosis not present

## 2022-04-16 DIAGNOSIS — I10 Essential (primary) hypertension: Secondary | ICD-10-CM | POA: Diagnosis not present

## 2022-04-16 DIAGNOSIS — H401131 Primary open-angle glaucoma, bilateral, mild stage: Secondary | ICD-10-CM | POA: Diagnosis not present

## 2022-04-16 DIAGNOSIS — H401130 Primary open-angle glaucoma, bilateral, stage unspecified: Secondary | ICD-10-CM | POA: Diagnosis not present

## 2022-04-16 MED ORDER — EPINEPHRINE 0.3 MG/0.3ML IJ SOAJ: INTRAMUSCULAR | 0 refills | Status: DC

## 2022-04-16 NOTE — Telephone Encounter (Signed)
Zolpidem refilled.

## 2022-04-16 NOTE — Telephone Encounter (Signed)
Please advise on med refill.  Allergies  Allergen Reactions   Celebrex [Celecoxib] Swelling and Rash   Pregabalin Other (See Comments), Rash and Swelling    *LYRICA* REACTION: itching, swelling *LYRICA* REACTION: itching, swelling *LYRICA* REACTION: itching, swelling   Sulfa Antibiotics Other (See Comments)   '  Current Outpatient Medications:    albuterol (VENTOLIN HFA) 108 (90 Base) MCG/ACT inhaler, TAKE 2 PUFFS EVERY 4 HOURS AS NEEDED -RESCUE, Disp: 18 each, Rfl: 5   ALPRAZolam (XANAX) 0.5 MG tablet, Take 0.5 mg by mouth 3 (three) times daily as needed (anxiety). Anxiety., Disp: , Rfl:    ANORO ELLIPTA 62.5-25 MCG/ACT AEPB, INHALE 1 PUFF BY MOUTH EVERY DAY, Disp: 60 each, Rfl: 12   Benralizumab (FASENRA PEN) 30 MG/ML SOAJ, INJECT 30 MG INTO THE SKIN EVERY 8 (EIGHT) WEEKS. DELIVERY TO [facility]: [address], [address], , [place] [address], Disp: 1 mL, Rfl: 2   Budeson-Glycopyrrol-Formoterol (BREZTRI AEROSPHERE) 160-9-4.8 MCG/ACT AERO, Inhale 2 puffs into the lungs 2 (two) times daily., Disp: 5.9 g, Rfl: 0   clotrimazole-betamethasone (LOTRISONE) cream, APPLY TO AFFECTED AREA TWICE A DAY, Disp: 45 g, Rfl: 0   EPINEPHrine 0.3 mg/0.3 mL IJ SOAJ injection, INJECT INTO THE THIGH ONCE FOR SEVERE ALLERGIC REACTION., Disp: 2 each, Rfl: 0   fluconazole (DIFLUCAN) 150 MG tablet, TAKE 1 TABLET BY MOUTH AS ONE DOSE, Disp: 1 tablet, Rfl: 0   ibuprofen (ADVIL) 800 MG tablet, TAKE 1 TABLET BY MOUTH EVERY 8 HOURS AS NEEDED, Disp: 30 tablet, Rfl: 5   ipratropium-albuterol (DUONEB) 0.5-2.5 (3) MG/3ML SOLN, Inhale 3 mLs into the lungs every 4 (four) hours as needed (asthma). USE 1 VIAL VIA NEBULIZER EVERY 4 HRS AS NEEDED, Disp: 360 mL, Rfl: 5   isosorbide mononitrate (IMDUR) 60 MG 24 hr tablet, Take 1 tablet (60 mg total) by mouth daily., Disp: 90 tablet, Rfl: 3   losartan (COZAAR) 100 MG tablet, TAKE 1 TABLET BY MOUTH EVERY DAY, Disp: 90 tablet, Rfl: 0   metoprolol tartrate (LOPRESSOR)  25 MG tablet, TAKE 1 TABLET BY MOUTH TWICE A DAY, Disp: 180 tablet, Rfl: 2   montelukast (SINGULAIR) 10 MG tablet, Take 1 tablet (10 mg total) by mouth daily., Disp: 90 tablet, Rfl: 4   nitroGLYCERIN (NITROSTAT) 0.4 MG SL tablet, Take 1 tablet under your tongue for chest pain, while sitting.  If no relief of pain may repeat Nitroglycerin, one tab every 5 minutes up to 3 tablets total over 15 minutes.  If no relief CALL 911, Disp: 25 tablet, Rfl: 3   omeprazole (PRILOSEC) 40 MG capsule, Take 40 mg by mouth 2 (two) times daily., Disp: , Rfl:    ondansetron (ZOFRAN) 4 MG tablet, Take 4 mg by mouth every 8 (eight) hours as needed for nausea or vomiting. , Disp: , Rfl: 5   spironolactone (ALDACTONE) 25 MG tablet, TAKE 1 TABLET BY MOUTH EVERY DAY, Disp: 90 tablet, Rfl: 2   torsemide (DEMADEX) 20 MG tablet, TAKE 1 TABLET BY MOUTH TWICE A DAY, Disp: 180 tablet, Rfl: 3   zolpidem (AMBIEN) 10 MG tablet, TAKE 1/2 TO 1 TAB AT BEDTIME FOR SLEEP AS NEEDED, Disp: 30 tablet, Rfl: 5

## 2022-04-19 ENCOUNTER — Other Ambulatory Visit: Payer: Self-pay | Admitting: Internal Medicine

## 2022-04-19 ENCOUNTER — Telehealth: Payer: Self-pay | Admitting: Physician Assistant

## 2022-04-19 ENCOUNTER — Other Ambulatory Visit: Payer: Self-pay | Admitting: Obstetrics

## 2022-04-19 DIAGNOSIS — R52 Pain, unspecified: Secondary | ICD-10-CM

## 2022-04-19 NOTE — Telephone Encounter (Signed)
..   Medicaid Managed Care   Unsuccessful Outreach Note  04/19/2022 Name: Kelly Butler MRN: [id number] DOB: 04-03-1961  Referred by: [name], PA-C Reason for referral : High Risk Managed Medicaid (I called the patient today to get her scheduled with the MM team. I left my name and number on her VM.)   A second unsuccessful telephone outreach was attempted today. The patient was referred to the case management team for assistance with care management and care coordination.   Follow Up Plan: The care management team will reach out to the patient again over the next 14 days.     [place]

## 2022-04-22 ENCOUNTER — Telehealth: Payer: Self-pay | Admitting: Internal Medicine

## 2022-04-22 NOTE — Telephone Encounter (Signed)
I think she is describing an irritant effect and not a true "allergy" to [facility]. If it is not helping, then stop using it and instead use nebulizer every 4-6 hours.

## 2022-04-22 NOTE — Telephone Encounter (Signed)
Called and spoke with pt who states everytime she uses the [employer] inhaler which she was given a sample of at last visit, she feels like she is unable to catch her breath. Due to this, pt wants to know what else Dr. [name] could recommend in regards to an inhaler. Dr. [name], please advise.  Please route this back to triage pool.  Allergies  Allergen Reactions   Celebrex [Celecoxib] Swelling and Rash   Pregabalin Other (See Comments), Rash and Swelling    *LYRICA* REACTION: itching, swelling *LYRICA* REACTION: itching, swelling *LYRICA* REACTION: itching, swelling   Sulfa Antibiotics Other (See Comments)     Current Outpatient Medications:    albuterol (VENTOLIN HFA) 108 (90 Base) MCG/ACT inhaler, TAKE 2 PUFFS EVERY 4 HOURS AS NEEDED -RESCUE, Disp: 18 each, Rfl: 5   ALPRAZolam (XANAX) 0.5 MG tablet, Take 0.5 mg by mouth 3 (three) times daily as needed (anxiety). Anxiety., Disp: , Rfl:    ANORO ELLIPTA 62.5-25 MCG/ACT AEPB, INHALE 1 PUFF BY MOUTH EVERY DAY, Disp: 60 each, Rfl: 12   Benralizumab (FASENRA PEN) 30 MG/ML SOAJ, INJECT 30 MG INTO THE SKIN EVERY 8 (EIGHT) WEEKS. DELIVERY TO [facility]: [address], [address], [place], [place] [address], Disp: 1 mL, Rfl: 2   Budeson-Glycopyrrol-Formoterol (BREZTRI AEROSPHERE) 160-9-4.8 MCG/ACT AERO, Inhale 2 puffs into the lungs 2 (two) times daily., Disp: 5.9 g, Rfl: 0   clotrimazole-betamethasone (LOTRISONE) cream, APPLY TO AFFECTED AREA TWICE A DAY, Disp: 45 g, Rfl: 0   EPINEPHrine 0.3 mg/0.3 mL IJ SOAJ injection, Inject into the thigh once for severe allergic reaction., Disp: 2 each, Rfl: 0   fluconazole (DIFLUCAN) 150 MG tablet, TAKE 1 TABLET BY MOUTH AS ONE DOSE, Disp: 1 tablet, Rfl: 0   ibuprofen (ADVIL) 800 MG tablet, TAKE 1 TABLET BY MOUTH EVERY 8 HOURS AS NEEDED, Disp: 30 tablet, Rfl: 5   ipratropium-albuterol (DUONEB) 0.5-2.5 (3) MG/3ML SOLN, Inhale 3 mLs into the lungs every 4 (four) hours as needed (asthma). USE 1 VIAL VIA  NEBULIZER EVERY 4 HRS AS NEEDED, Disp: 360 mL, Rfl: 5   isosorbide mononitrate (IMDUR) 60 MG 24 hr tablet, Take 1 tablet (60 mg total) by mouth daily., Disp: 90 tablet, Rfl: 3   losartan (COZAAR) 100 MG tablet, TAKE 1 TABLET BY MOUTH EVERY DAY, Disp: 90 tablet, Rfl: 0   metoprolol tartrate (LOPRESSOR) 25 MG tablet, TAKE 1 TABLET BY MOUTH TWICE A DAY, Disp: 180 tablet, Rfl: 2   montelukast (SINGULAIR) 10 MG tablet, Take 1 tablet (10 mg total) by mouth daily., Disp: 90 tablet, Rfl: 4   nitroGLYCERIN (NITROSTAT) 0.4 MG SL tablet, Take 1 tablet under your tongue for chest pain, while sitting.  If no relief of pain may repeat Nitroglycerin, one tab every 5 minutes up to 3 tablets total over 15 minutes.  If no relief CALL 911, Disp: 25 tablet, Rfl: 3   omeprazole (PRILOSEC) 40 MG capsule, Take 40 mg by mouth 2 (two) times daily., Disp: , Rfl:    ondansetron (ZOFRAN) 4 MG tablet, Take 4 mg by mouth every 8 (eight) hours as needed for nausea or vomiting. , Disp: , Rfl: 5   spironolactone (ALDACTONE) 25 MG tablet, TAKE 1 TABLET BY MOUTH EVERY DAY, Disp: 90 tablet, Rfl: 2   torsemide (DEMADEX) 20 MG tablet, TAKE 1 TABLET BY MOUTH TWICE A DAY, Disp: 180 tablet, Rfl: 3   zolpidem (AMBIEN) 10 MG tablet, TAKE 1/2 TO 1 TAB AT BEDTIME FOR SLEEP AS  NEEDED, Disp: 30 tablet, Rfl: 5

## 2022-04-22 NOTE — Telephone Encounter (Signed)
Patient stated that she cannot take the new haler that the doctor prescribed at her last visit.  She stated that every time she uses it, she cannot breath.  Please advise and call patient to discuss further at [phone]

## 2022-04-22 NOTE — Telephone Encounter (Signed)
Attempted to call pt but unable to reach. Left message for her to return call. 

## 2022-04-23 DIAGNOSIS — M5136 Other intervertebral disc degeneration, lumbar region: Secondary | ICD-10-CM | POA: Diagnosis not present

## 2022-04-23 DIAGNOSIS — M4316 Spondylolisthesis, lumbar region: Secondary | ICD-10-CM | POA: Diagnosis not present

## 2022-04-23 DIAGNOSIS — M5134 Other intervertebral disc degeneration, thoracic region: Secondary | ICD-10-CM | POA: Diagnosis not present

## 2022-04-23 DIAGNOSIS — M546 Pain in thoracic spine: Secondary | ICD-10-CM | POA: Diagnosis not present

## 2022-04-23 DIAGNOSIS — M545 Low back pain, unspecified: Secondary | ICD-10-CM | POA: Diagnosis not present

## 2022-04-23 DIAGNOSIS — M47816 Spondylosis without myelopathy or radiculopathy, lumbar region: Secondary | ICD-10-CM | POA: Diagnosis not present

## 2022-04-26 NOTE — Telephone Encounter (Signed)
ATC LVMTCB X2

## 2022-04-26 NOTE — Telephone Encounter (Signed)
Due to multiple times trying to reach out to pt and unable to do so, per protocol encounter will be closed.

## 2022-04-27 ENCOUNTER — Other Ambulatory Visit: Payer: Self-pay | Admitting: Cardiovascular Disease

## 2022-04-30 ENCOUNTER — Ambulatory Visit: Payer: Medicaid Other | Admitting: Obstetrics

## 2022-04-30 DIAGNOSIS — M797 Fibromyalgia: Secondary | ICD-10-CM | POA: Diagnosis not present

## 2022-04-30 DIAGNOSIS — G8929 Other chronic pain: Secondary | ICD-10-CM | POA: Diagnosis not present

## 2022-04-30 DIAGNOSIS — M79601 Pain in right arm: Secondary | ICD-10-CM | POA: Diagnosis not present

## 2022-04-30 DIAGNOSIS — L304 Erythema intertrigo: Secondary | ICD-10-CM | POA: Diagnosis not present

## 2022-04-30 DIAGNOSIS — M545 Low back pain, unspecified: Secondary | ICD-10-CM | POA: Diagnosis not present

## 2022-05-03 ENCOUNTER — Other Ambulatory Visit (HOSPITAL_COMMUNITY): Payer: Self-pay

## 2022-05-05 DIAGNOSIS — Z419 Encounter for procedure for purposes other than remedying health state, unspecified: Secondary | ICD-10-CM | POA: Diagnosis not present

## 2022-05-07 ENCOUNTER — Other Ambulatory Visit (HOSPITAL_COMMUNITY): Payer: Self-pay

## 2022-05-07 ENCOUNTER — Other Ambulatory Visit: Payer: Self-pay | Admitting: Internal Medicine

## 2022-05-07 DIAGNOSIS — J4540 Moderate persistent asthma, uncomplicated: Secondary | ICD-10-CM

## 2022-05-07 DIAGNOSIS — J4551 Severe persistent asthma with (acute) exacerbation: Secondary | ICD-10-CM

## 2022-05-07 MED ORDER — FASENRA PEN 30 MG/ML ~~LOC~~ SOAJ
SUBCUTANEOUS | 2 refills | Status: DC
  Filled 2022-05-07: qty 1, 56d supply, fill #0
  Filled 2022-06-24: qty 1, 56d supply, fill #1
  Filled 2022-07-20 – 2022-08-30 (×2): qty 1, 56d supply, fill #2

## 2022-05-07 NOTE — Telephone Encounter (Signed)
Refill sent for [facility] to [place]: [phone]   Dose: 30 mg SQ every 8 weeks  Last OV: 04/12/22 Provider: Dr. [name]  Next OV: 10/12/2022  Knox Saliva, PharmD, MPH, BCPS Clinical Pharmacist (Rheumatology and Pulmonology)

## 2022-05-10 ENCOUNTER — Other Ambulatory Visit (HOSPITAL_COMMUNITY): Payer: Self-pay

## 2022-05-11 ENCOUNTER — Other Ambulatory Visit: Payer: Self-pay | Admitting: Internal Medicine

## 2022-05-12 NOTE — Telephone Encounter (Signed)
Epipen refilled

## 2022-05-13 ENCOUNTER — Ambulatory Visit (INDEPENDENT_AMBULATORY_CARE_PROVIDER_SITE_OTHER): Payer: Medicaid Other | Admitting: *Deleted

## 2022-05-13 ENCOUNTER — Ambulatory Visit: Payer: Medicaid Other | Admitting: Physician Assistant

## 2022-05-13 VITALS — BP 162/81 | HR 83 | Temp 97.9°F | Resp 16 | Ht 60.0 in | Wt 166.6 lb

## 2022-05-13 DIAGNOSIS — J4550 Severe persistent asthma, uncomplicated: Secondary | ICD-10-CM

## 2022-05-13 MED ORDER — BENRALIZUMAB 30 MG/ML ~~LOC~~ SOSY
30.0000 mg | PREFILLED_SYRINGE | Freq: Once | SUBCUTANEOUS | Status: AC
  Administered 2022-05-13: 30 mg via SUBCUTANEOUS

## 2022-05-13 NOTE — Progress Notes (Signed)
Diagnosis: Asthma  Provider:  Marshell Garfinkel MD  Procedure: Injection  Fasenra (Benralizumab), Dose: 30 mg, Site: subcutaneous, Number of injections: 1  Post Care: Observation period completed  Discharge: Condition: Good, Destination: Home . AVS provided to patient.   Performed by:  Oren Beckmann, RN

## 2022-05-14 DIAGNOSIS — M47816 Spondylosis without myelopathy or radiculopathy, lumbar region: Secondary | ICD-10-CM | POA: Diagnosis not present

## 2022-05-24 DIAGNOSIS — Z96651 Presence of right artificial knee joint: Secondary | ICD-10-CM | POA: Diagnosis not present

## 2022-05-24 DIAGNOSIS — M170 Bilateral primary osteoarthritis of knee: Secondary | ICD-10-CM | POA: Diagnosis not present

## 2022-05-25 ENCOUNTER — Other Ambulatory Visit: Payer: Self-pay | Admitting: Cardiovascular Disease

## 2022-05-28 ENCOUNTER — Encounter: Payer: Self-pay | Admitting: Internal Medicine

## 2022-05-28 ENCOUNTER — Ambulatory Visit (HOSPITAL_COMMUNITY)
Admission: RE | Admit: 2022-05-28 | Discharge: 2022-05-28 | Disposition: A | Discharge status: Home / Self Care | Payer: Medicaid Other | Source: Ambulatory Visit | Attending: Family Medicine | Admitting: Family Medicine

## 2022-05-28 ENCOUNTER — Ambulatory Visit (INDEPENDENT_AMBULATORY_CARE_PROVIDER_SITE_OTHER): Payer: Medicaid Other

## 2022-05-28 ENCOUNTER — Encounter (HOSPITAL_COMMUNITY): Payer: Self-pay

## 2022-05-28 VITALS — BP 161/78 | HR 67 | Temp 98.4°F | Resp 18

## 2022-05-28 DIAGNOSIS — M542 Cervicalgia: Secondary | ICD-10-CM

## 2022-05-28 DIAGNOSIS — M545 Low back pain, unspecified: Secondary | ICD-10-CM | POA: Diagnosis not present

## 2022-05-28 DIAGNOSIS — M79631 Pain in right forearm: Secondary | ICD-10-CM

## 2022-05-28 DIAGNOSIS — M79601 Pain in right arm: Secondary | ICD-10-CM | POA: Diagnosis not present

## 2022-05-28 MED ORDER — HYDROCODONE-ACETAMINOPHEN 5-325 MG PO TABS: 1.0000 | ORAL_TABLET | Freq: Four times a day (QID) | ORAL | 0 refills | Status: DC | PRN

## 2022-05-28 MED ORDER — TIZANIDINE HCL 4 MG PO TABS: 4.0000 mg | ORAL_TABLET | Freq: Three times a day (TID) | ORAL | 0 refills | Status: AC | PRN

## 2022-05-28 MED ORDER — KETOROLAC TROMETHAMINE 30 MG/ML IJ SOLN
INTRAMUSCULAR | Status: AC
  Filled 2022-05-28: qty 1

## 2022-05-28 MED ORDER — KETOROLAC TROMETHAMINE 10 MG PO TABS: 10.0000 mg | ORAL_TABLET | Freq: Four times a day (QID) | ORAL | 0 refills | Status: DC | PRN

## 2022-05-28 MED ORDER — KETOROLAC TROMETHAMINE 30 MG/ML IJ SOLN
30.0000 mg | Freq: Once | INTRAMUSCULAR | Status: AC
  Administered 2022-05-28: 30 mg via INTRAMUSCULAR
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
## 2022-05-28 NOTE — Discharge Instructions (Signed)
X-rays do not show any broken bones  You have been given a shot of Toradol 30 mg today.  Take Toradol/ketorolac 10 mg--1 tablet every 6 hours as needed for pain.  This is an anti-inflammatory and pain medicine  Hydrocodone 5 mg--1 tablet every 6 hours as needed for pain.  This is best taken with food.  It can cause sleepiness or dizziness   Take tizanidine 4 mg--1 every 8 hours as needed for muscle spasms; this can also cause sleepiness.  You may want to reserve it for bedtime  Please follow-up with your primary care and your pain management about this neck and back pain
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
## 2022-06-03 ENCOUNTER — Ambulatory Visit: Payer: Medicaid Other | Admitting: Physician Assistant

## 2022-06-03 DIAGNOSIS — R072 Precordial pain: Secondary | ICD-10-CM

## 2022-06-03 DIAGNOSIS — I5020 Unspecified systolic (congestive) heart failure: Secondary | ICD-10-CM

## 2022-06-03 DIAGNOSIS — I10 Essential (primary) hypertension: Secondary | ICD-10-CM

## 2022-06-03 DIAGNOSIS — R002 Palpitations: Secondary | ICD-10-CM

## 2022-06-03 DIAGNOSIS — J4540 Moderate persistent asthma, uncomplicated: Secondary | ICD-10-CM

## 2022-06-04 DIAGNOSIS — Z419 Encounter for procedure for purposes other than remedying health state, unspecified: Secondary | ICD-10-CM | POA: Diagnosis not present

## 2022-06-04 NOTE — Progress Notes (Signed)
Cardiology Office Note:    Date:  06/08/2022   ID:  Kelly Butler, DOB [birth year], MRN [id number]  PCP:  [name], [place] [facility] Cardiologist:  Jenkins Rouge, MD     Referring MD: [name], PA-C   Chief Complaint:  No chief complaint on file.     History of Present Illness:   Kelly Butler is a 61 y.o. female with  a past medical history significant for HFimpEF, nonischemic cardiomyopathy (EF in 2018: 40-45, echo 07/2018: EF 50-55, 40-45% 02/2021), cardiac cath 2018 with normal coronary arteries, asthma, OHS/restriction, chronic respiratory failure (home oxygen for sleep and as needed), hypertension, GERD.  Patient comes in for f/u. She continues to have chest pain and is awaiting clearance for knee surgery. Having chest pain about twice a week, sometimes twice in a day relieved with SL NTG. She describes it as a sharp pain shooting all over and can't take a deep breath and some left arm numbness.  When she exercises she has a lot of chest pressure and heart racing and she has to stop. Also has asthma. She doesn't push herself anymore. Has occasional swelling but watches her salt closely. BP sometimes 166-170/80's at other doctor visits. Doesn't have a cuff at home.. Also has occasional fleeting dizziness.       Past Medical History:  Diagnosis Date   Abnormal heart rhythm    Allergic rhinitis    skin test POS 11/08/08   Asthma    Disorder of vocal cord    Dyspnea    Fibromyalgia    GERD (gastroesophageal reflux disease)    History of nuclear stress test    Myoview 3/22: EF 52, normal perfusion, low risk   Hypertension    Sleep apnea    borderline    Current Medications: Current Meds  Medication Sig   albuterol (VENTOLIN HFA) 108 (90 Base) MCG/ACT inhaler TAKE 2 PUFFS EVERY 4 HOURS AS NEEDED -RESCUE   ALPRAZolam (XANAX) 0.5 MG tablet Take 0.5 mg by mouth 3 (three) times daily as needed (anxiety). Anxiety.   amLODipine (NORVASC) 5 MG tablet Take 1 tablet  (5 mg total) by mouth daily.   baclofen (LIORESAL) 10 MG tablet Take 10 mg by mouth 2 (two) times daily.   Benralizumab (FASENRA PEN) 30 MG/ML SOAJ INJECT 30 MG INTO THE SKIN EVERY 8 (EIGHT) WEEKS. DELIVERY TO [facility]: [address], [address], [place], [place] [address]   cloNIDine (CATAPRES) 0.1 MG tablet Take by mouth daily.   clotrimazole-betamethasone (LOTRISONE) cream Apply topically.   Diethylpropion HCl CR 75 MG TB24 TAKE 1 TABLET DAILY IN THE MORNING, 20-30 MINUTES BEFORE BREAKFAST.   EPINEPHrine (EPIPEN 2-PAK) 0.3 mg/0.3 mL IJ SOAJ injection INJECT INTO THE THIGH ONCE FOR SEVERE ALLERGIC REACTION.   HYDROcodone-acetaminophen (NORCO/VICODIN) 5-325 MG tablet Take 1 tablet by mouth every 6 (six) hours as needed (pain).   hydrOXYzine (VISTARIL) 25 MG capsule Take by mouth.   ipratropium-albuterol (DUONEB) 0.5-2.5 (3) MG/3ML SOLN Inhale 3 mLs into the lungs every 4 (four) hours as needed (asthma). USE 1 VIAL VIA NEBULIZER EVERY 4 HRS AS NEEDED   isosorbide mononitrate (IMDUR) 60 MG 24 hr tablet Take 1 tablet (60 mg total) by mouth daily.   ketoconazole (NIZORAL) 2 % cream Apply topically 2 (two) times daily.   ketorolac (TORADOL) 10 MG tablet Take 1 tablet (10 mg total) by mouth every 6 (six) hours as needed (pain).   latanoprost (XALATAN) 0.005 % ophthalmic solution  SMARTSIG:In Eye(s)   losartan (COZAAR) 100 MG tablet Take 1 tablet (100 mg total) by mouth daily.   metoprolol tartrate (LOPRESSOR) 25 MG tablet TAKE 1 TABLET BY MOUTH TWICE A DAY   montelukast (SINGULAIR) 10 MG tablet Take 1 tablet (10 mg total) by mouth daily.   nitroGLYCERIN (NITROSTAT) 0.4 MG SL tablet Take 1 tablet under your tongue for chest pain, while sitting.  If no relief of pain may repeat Nitroglycerin, one tab every 5 minutes up to 3 tablets total over 15 minutes.  If no relief CALL 911   omeprazole (PRILOSEC) 40 MG capsule Take 40 mg by mouth 2 (two) times daily.   spironolactone (ALDACTONE) 25 MG  tablet TAKE 1 TABLET BY MOUTH EVERY DAY   tiZANidine (ZANAFLEX) 4 MG tablet Take 1 tablet (4 mg total) by mouth every 8 (eight) hours as needed for muscle spasms.   torsemide (DEMADEX) 20 MG tablet TAKE 1 TABLET BY MOUTH TWICE A DAY   traMADol (ULTRAM) 50 MG tablet Take by mouth.   zolpidem (AMBIEN) 10 MG tablet TAKE 1/2 TO 1 TAB AT BEDTIME FOR SLEEP AS NEEDED    Allergies:   Celebrex [celecoxib], Pregabalin, Sulfa antibiotics, and Cymbalta [duloxetine hcl]   Social History   Tobacco Use   Smoking status: Former    Packs/day: 0.30    Years: 6.00    Total pack years: 1.80    Types: Cigarettes    Quit date: 07/05/1989    Years since quitting: 32.9   Smokeless tobacco: Never  Vaping Use   Vaping Use: Never used  Substance Use Topics   Alcohol use: No   Drug use: No    Family Hx: The patient's family history includes Asthma in her mother and son; Breast cancer in her mother; Coronary artery disease in her father and mother; Depression in her mother; Diabetes in her brother; Heart disease in her father and mother; Hypertension in her brother, father, and mother; Stroke in her father and maternal grandfather.  ROS     Physical Exam:    VS:  BP (!) 145/82   Pulse 70   Ht 5' (1.524 m)   Wt 164 lb 6.4 oz (74.6 kg)   LMP 10/17/2017 Comment: tubal ligation  SpO2 98%   BMI 32.11 kg/m     Wt Readings from Last 3 Encounters:  06/08/22 164 lb 6.4 oz (74.6 kg)  05/13/22 166 lb 9.6 oz (75.6 kg)  04/12/22 166 lb (75.3 kg)    Physical Exam  GEN: Well nourished, well developed, in no acute distress  Neck: no JVD, carotid bruits, or masses Cardiac:RRR; no murmurs, rubs, or gallops  Respiratory:  clear to auscultation bilaterally, normal work of breathing GI: soft, nontender, nondistended, + BS Ext: without cyanosis, clubbing, or edema,  Neuro:  Alert and Oriented x 3, Strength and sensation are intact Psych: euthymic mood, full affect        EKGs/Labs/Other Test Reviewed:     EKG:  EKG is not ordered today.     Recent Labs: No results found for requested labs within last 365 days.   Recent Lipid Panel No results for input(s): "CHOL", "TRIG", "HDL", "VLDL", "LDLCALC", "LDLDIRECT" in the last 8760 hours.   Prior CV Studies:    Lexiscan Myoview 09/2020 The left ventricular ejection fraction is mildly decreased (45-54%). Nuclear stress EF: 52%. No T wave inversion was noted during stress. There was no ST segment deviation noted during stress. This is a low risk study.  Normal perfusion. LVEF 52% with normal wall motion. This is a low risk study. No prior for comparison.   ECHO COMPLETE WO IMAGING ENHANCING AGENT 07/24/2018 Mild LVH, EF 50-55, no RWMA, GR 1 DD, mild AI, mild MR, trivial pericardial effusion   RIGHT/LEFT HEART CATH AND CORONARY ANGIOGRAPHY 06/17/2017 1. Normal coronary anatomy 2. Mild to moderate LV dysfunction. EF estimated at 40-45% 3. Moderate pulmonary HTN 4. Moderately elevated LV filling pressures. 5. Reduced cardiac output.   ECHO COMPLETE WO IMAGING ENHANCING AGENT 04/08/2017 Mild concentric LVH, EF 45-50, anterolateral and inferolateral HK, GRII DD, mild AI, mild MR, mild LAE, normal RVSF, PASP 38, mild TR.   Risk Assessment/Calculations/Metrics:         HYPERTENSION CONTROL Vitals:   06/08/22 0805 06/08/22 0832  BP: (!) 144/80 (!) 145/82    The patient's blood pressure is elevated above target today.  In order to address the patient's elevated BP: A new medication was prescribed today.       ASSESSMENT & PLAN:   No problem-specific Assessment & Plan notes found for this encounter.   Chest pain -stress test negative from 3/22 -Normal cardiac catheterization in 2018 -Recurrent chest pain at rest and with exertion. Doesn't want to do a CTA because of dye and waiting on approval for lexiscan -add amlodipine -awaiting surgical clearance for knee surgery.   HFimpEF -Echo with EF 40-45% (8/22) -Euvolemic on  exam today -Exercise is mostly limited by her asthma -Continue Imdur, Cozaar, Lopressor, Aldactone, Demadex   Essential hypertension -slightly elevated today -add amlodipine   Allergic asthma, moderate persistent, uncomplicated -Continue inhaled medication   Palpitations -  14-day ZIO monitor 02/2021 did not show any dangerous arrhythmias -She explains it as an occasional fluttering in her chest that does not last very long, she had rare PVCs on the monitor which is likely the culprit of her fluttering        Shared Decision Making/Informed Consent The risks [chest pain, shortness of breath, cardiac arrhythmias, dizziness, blood pressure fluctuations, myocardial infarction, stroke/transient ischemic attack, nausea, vomiting, allergic reaction, radiation exposure, metallic taste sensation and life-threatening complications (estimated to be 1 in 10,000)], benefits (risk stratification, diagnosing coronary artery disease, treatment guidance) and alternatives of a nuclear stress test were discussed in detail with Ms. Raney and she agrees to proceed.   Dispo:  No follow-ups on file.   Medication Adjustments/Labs and Tests Ordered: Current medicines are reviewed at length with the patient today.  Concerns regarding medicines are outlined above.  Tests Ordered: Orders Placed This Encounter  Procedures   CBC   Basic metabolic panel   Lipid panel   Cardiac Stress Test: Informed Consent Details: Physician/Practitioner Attestation; Transcribe to consent form and obtain patient signature   MYOCARDIAL PERFUSION IMAGING   ECHOCARDIOGRAM COMPLETE   Medication Changes: Meds ordered this encounter  Medications   amLODipine (NORVASC) 5 MG tablet    Sig: Take 1 tablet (5 mg total) by mouth daily.    Dispense:  90 tablet    Refill:  3   Signed, Ermalinda Barrios, PA-C  06/08/2022 8:55 AM    [address], [place], [place]  [address] Phone: [phone]; Fax: [phone]

## 2022-06-08 ENCOUNTER — Ambulatory Visit: Payer: Medicaid Other | Attending: Physician Assistant | Admitting: Physician Assistant

## 2022-06-08 ENCOUNTER — Encounter: Payer: Self-pay | Admitting: Physician Assistant

## 2022-06-08 ENCOUNTER — Ambulatory Visit: Payer: Medicaid Other | Admitting: Obstetrics

## 2022-06-08 DIAGNOSIS — I10 Essential (primary) hypertension: Secondary | ICD-10-CM

## 2022-06-08 DIAGNOSIS — I2089 Other forms of angina pectoris: Secondary | ICD-10-CM | POA: Diagnosis not present

## 2022-06-08 DIAGNOSIS — Z01810 Encounter for preprocedural cardiovascular examination: Secondary | ICD-10-CM

## 2022-06-08 DIAGNOSIS — R002 Palpitations: Secondary | ICD-10-CM | POA: Diagnosis not present

## 2022-06-08 DIAGNOSIS — I5020 Unspecified systolic (congestive) heart failure: Secondary | ICD-10-CM

## 2022-06-08 DIAGNOSIS — J4540 Moderate persistent asthma, uncomplicated: Secondary | ICD-10-CM | POA: Diagnosis not present

## 2022-06-08 MED ORDER — AMLODIPINE BESYLATE 5 MG PO TABS: 5.0000 mg | ORAL_TABLET | Freq: Every day | ORAL | 3 refills | Status: DC

## 2022-06-08 NOTE — Patient Instructions (Addendum)
Medication Instructions:  Your physician has recommended you make the following change in your medication:  1) START taking amlodipine 5 mg daily   *If you need a refill on your cardiac medications before your next appointment, please call your pharmacy*  Labs: TODAY: CBC, FLP, and BMET  Testing/Procedures: Your physician has requested that you have an echocardiogram. Echocardiography is a painless test that uses sound waves to create images of your heart. It provides your doctor with information about the size and shape of your heart and how well your heart's chambers and valves are working. This procedure takes approximately one hour. There are no restrictions for this procedure. Please do NOT wear cologne, perfume, aftershave, or lotions (deodorant is allowed). Please arrive 15 minutes prior to your appointment time.  Follow-Up: At [facility], you and your health needs are our priority.  As part of our continuing mission to provide you with exceptional heart care, we have created designated Provider Care Teams.  These Care Teams include your primary Cardiologist (physician) and Advanced Practice Providers (APPs -  Physician Assistants and Nurse Practitioners) who all work together to provide you with the care you need, when you need it.  Your next appointment:   After echocardiogram  The format for your next appointment:   In Person  Provider:   [name], PA-C     Important Information About Sugar

## 2022-06-09 ENCOUNTER — Other Ambulatory Visit: Payer: Self-pay

## 2022-06-09 ENCOUNTER — Emergency Department (HOSPITAL_COMMUNITY)
Admission: EM | Admit: 2022-06-09 | Discharge: 2022-06-09 | Disposition: A | Discharge status: Home / Self Care | Payer: Medicaid Other | Attending: Emergency Medicine | Admitting: Emergency Medicine

## 2022-06-09 DIAGNOSIS — M545 Low back pain, unspecified: Secondary | ICD-10-CM | POA: Diagnosis not present

## 2022-06-09 DIAGNOSIS — M549 Dorsalgia, unspecified: Secondary | ICD-10-CM | POA: Diagnosis not present

## 2022-06-09 DIAGNOSIS — M7989 Other specified soft tissue disorders: Secondary | ICD-10-CM | POA: Diagnosis not present

## 2022-06-09 DIAGNOSIS — R0602 Shortness of breath: Secondary | ICD-10-CM | POA: Insufficient documentation

## 2022-06-09 LAB — BASIC METABOLIC PANEL
Anion gap: 10 (ref 5–15)
BUN/Creatinine Ratio: 18 (ref 9–23)
BUN: 12 mg/dL (ref 6–24)
BUN: 16 mg/dL (ref 6–20)
CO2: 27 mmol/L (ref 20–29)
CO2: 25 mmol/L (ref 22–32)
Calcium: 9.4 mg/dL (ref 8.7–10.2)
Calcium: 9.1 mg/dL (ref 8.9–10.3)
Chloride: 105 mmol/L (ref 96–106)
Chloride: 107 mmol/L (ref 98–111)
Creatinine, Ser: 0.65 mg/dL (ref 0.57–1.00)
Creatinine, Ser: 0.71 mg/dL (ref 0.44–1.00)
GFR, Estimated: >60 mL/min (ref >60)
Glucose, Bld: 81 mg/dL (ref 70–99)
Glucose: 104 mg/dL — ABNORMAL HIGH (ref 70–99)
Potassium: 4 mmol/L (ref 3.5–5.2)
Potassium: 3.1 mmol/L — ABNORMAL LOW (ref 3.5–5.1)
Sodium: 143 mmol/L (ref 134–144)
Sodium: 142 mmol/L (ref 135–145)
eGFR: 101 mL/min/{1.73_m2} (ref >59)

## 2022-06-09 LAB — CBC
Hematocrit: 32.7 % — ABNORMAL LOW (ref 34.0–46.6)
Hemoglobin: 10.9 g/dL — ABNORMAL LOW (ref 11.1–15.9)
MCH: 29 pg (ref 26.6–33.0)
MCHC: 33.3 g/dL (ref 31.5–35.7)
MCV: 87 fL (ref 79–97)
Platelets: 317 10*3/uL (ref 150–450)
RBC: 3.76 x10E6/uL — ABNORMAL LOW (ref 3.77–5.28)
RDW: 12.4 % (ref 11.7–15.4)
WBC: 11.3 10*3/uL — ABNORMAL HIGH (ref 3.4–10.8)

## 2022-06-09 LAB — CBC WITH DIFFERENTIAL/PLATELET
Abs Immature Granulocytes: 0.03 10*3/uL (ref 0.00–0.07)
Basophils Absolute: 0 10*3/uL (ref 0.0–0.1)
Basophils Relative: 0 %
Eosinophils Absolute: 0 10*3/uL (ref 0.0–0.5)
Eosinophils Relative: 0 %
HCT: 34.8 % — ABNORMAL LOW (ref 36.0–46.0)
Hemoglobin: 11.2 g/dL — ABNORMAL LOW (ref 12.0–15.0)
Immature Granulocytes: 0 %
Lymphocytes Relative: 35 %
Lymphs Abs: 4 10*3/uL (ref 0.7–4.0)
MCH: 27.9 pg (ref 26.0–34.0)
MCHC: 32.2 g/dL (ref 30.0–36.0)
MCV: 86.6 fL (ref 80.0–100.0)
Monocytes Absolute: 0.4 10*3/uL (ref 0.1–1.0)
Monocytes Relative: 4 %
Neutro Abs: 7 10*3/uL (ref 1.7–7.7)
Neutrophils Relative %: 61 %
Platelets: 325 10*3/uL (ref 150–400)
RBC: 4.02 MIL/uL (ref 3.87–5.11)
RDW: 12.8 % (ref 11.5–15.5)
WBC: 11.5 10*3/uL — ABNORMAL HIGH (ref 4.0–10.5)
nRBC: 0 % (ref 0.0–0.2)

## 2022-06-09 LAB — LIPID PANEL
Chol/HDL Ratio: 4.4 ratio (ref 0.0–4.4)
Cholesterol, Total: 185 mg/dL (ref 100–199)
HDL: 42 mg/dL (ref >39)
LDL Chol Calc (NIH): 118 mg/dL — ABNORMAL HIGH (ref 0–99)
Triglycerides: 137 mg/dL (ref 0–149)
VLDL Cholesterol Cal: 25 mg/dL (ref 5–40)

## 2022-06-09 LAB — BRAIN NATRIURETIC PEPTIDE: B Natriuretic Peptide: 31 pg/mL (ref 0.0–100.0)

## 2022-06-09 MED ORDER — PREDNISONE 10 MG PO TABS: 40.0000 mg | ORAL_TABLET | Freq: Every day | ORAL | 0 refills | Status: DC

## 2022-06-09 MED ORDER — KETOROLAC TROMETHAMINE 15 MG/ML IJ SOLN
15.0000 mg | Freq: Once | INTRAMUSCULAR | Status: AC
  Administered 2022-06-09: 15 mg via INTRAMUSCULAR
  Filled 2022-06-09: qty 1
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
## 2022-06-09 NOTE — ED Notes (Signed)
Pt ambulated to triage room w/o difficulty.
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
## 2022-06-09 NOTE — Discharge Instructions (Addendum)
Return for any problem.  ?
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
## 2022-06-09 NOTE — ED Notes (Signed)
Pt verbalized understanding of discharge instructions. Opportunity for questions provided.  

## 2022-06-10 ENCOUNTER — Other Ambulatory Visit: Payer: Self-pay | Admitting: Cardiovascular Disease

## 2022-06-10 ENCOUNTER — Encounter (HOSPITAL_COMMUNITY): Payer: Medicaid Other

## 2022-06-11 ENCOUNTER — Telehealth: Payer: Self-pay | Admitting: Physician Assistant

## 2022-06-11 NOTE — Telephone Encounter (Signed)
Attempted to return pts call. No answer. It appears that patient has viewed results via Mychart. Vm left making patient aware of results and letting her know that she can call or send [place] a Mychart message if she has any questions. Results sent to pcp

## 2022-06-11 NOTE — Telephone Encounter (Signed)
  Pt is returning to get lab result

## 2022-06-15 DIAGNOSIS — M797 Fibromyalgia: Secondary | ICD-10-CM | POA: Diagnosis not present

## 2022-06-15 DIAGNOSIS — M5412 Radiculopathy, cervical region: Secondary | ICD-10-CM | POA: Diagnosis not present

## 2022-06-22 ENCOUNTER — Other Ambulatory Visit (HOSPITAL_COMMUNITY): Payer: Self-pay

## 2022-06-22 DIAGNOSIS — M461 Sacroiliitis, not elsewhere classified: Secondary | ICD-10-CM | POA: Diagnosis not present

## 2022-06-22 DIAGNOSIS — M5412 Radiculopathy, cervical region: Secondary | ICD-10-CM | POA: Diagnosis not present

## 2022-06-22 DIAGNOSIS — M7918 Myalgia, other site: Secondary | ICD-10-CM | POA: Diagnosis not present

## 2022-06-23 ENCOUNTER — Ambulatory Visit: Payer: Medicaid Other | Admitting: Obstetrics

## 2022-06-24 ENCOUNTER — Other Ambulatory Visit: Payer: Self-pay

## 2022-06-25 ENCOUNTER — Other Ambulatory Visit (HOSPITAL_COMMUNITY): Payer: Self-pay

## 2022-06-29 ENCOUNTER — Other Ambulatory Visit: Payer: Self-pay

## 2022-06-29 ENCOUNTER — Ambulatory Visit (HOSPITAL_COMMUNITY): Payer: Medicaid Other | Attending: Physician Assistant

## 2022-06-29 DIAGNOSIS — R079 Chest pain, unspecified: Secondary | ICD-10-CM

## 2022-06-29 DIAGNOSIS — I2089 Other forms of angina pectoris: Secondary | ICD-10-CM | POA: Insufficient documentation

## 2022-06-29 LAB — ECHOCARDIOGRAM COMPLETE
AR max vel: 1.38 cm2
AV Area VTI: 1.62 cm2
AV Area mean vel: 1.55 cm2
AV Mean grad: 10 mmHg
AV Peak grad: 23 mmHg
Ao pk vel: 2.4 m/s
Area-P 1/2: 2.39 cm2
P 1/2 time: 422 msec
S' Lateral: 3.1 cm

## 2022-07-01 ENCOUNTER — Other Ambulatory Visit: Payer: Self-pay
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
## 2022-07-08 ENCOUNTER — Ambulatory Visit: Payer: Medicaid Other | Admitting: Physician Assistant

## 2022-07-08 ENCOUNTER — Ambulatory Visit: Payer: Medicaid Other

## 2022-07-08 DIAGNOSIS — E785 Hyperlipidemia, unspecified: Secondary | ICD-10-CM

## 2022-07-08 DIAGNOSIS — I10 Essential (primary) hypertension: Secondary | ICD-10-CM

## 2022-07-08 DIAGNOSIS — I5020 Unspecified systolic (congestive) heart failure: Secondary | ICD-10-CM

## 2022-07-08 DIAGNOSIS — R072 Precordial pain: Secondary | ICD-10-CM

## 2022-07-08 DIAGNOSIS — R002 Palpitations: Secondary | ICD-10-CM

## 2022-07-08 MED ORDER — BENRALIZUMAB 30 MG/ML ~~LOC~~ SOSY: 30.0000 mg | PREFILLED_SYRINGE | Freq: Once | SUBCUTANEOUS | Status: DC

## 2022-07-09 ENCOUNTER — Ambulatory Visit: Payer: Medicaid Other

## 2022-07-12 ENCOUNTER — Other Ambulatory Visit: Payer: Self-pay | Admitting: Obstetrics

## 2022-07-12 ENCOUNTER — Telehealth: Payer: Self-pay | Admitting: Cardiovascular Disease

## 2022-07-12 DIAGNOSIS — Z1231 Encounter for screening mammogram for malignant neoplasm of breast: Secondary | ICD-10-CM

## 2022-07-12 NOTE — Telephone Encounter (Signed)
  Patient has sent a MyChart message requesting that her pre-op clearance that was submitted on 02/16/22 be followed up on since she has had an appointment on 06/08/22 and Echo was done on 06/29/22.

## 2022-07-13 ENCOUNTER — Ambulatory Visit (INDEPENDENT_AMBULATORY_CARE_PROVIDER_SITE_OTHER): Payer: Medicaid Other

## 2022-07-13 ENCOUNTER — Ambulatory Visit: Payer: Medicaid Other | Admitting: Family Medicine

## 2022-07-13 VITALS — BP 162/91 | HR 91 | Temp 98.7°F | Resp 16 | Ht 60.0 in | Wt 166.0 lb

## 2022-07-13 DIAGNOSIS — J4550 Severe persistent asthma, uncomplicated: Secondary | ICD-10-CM

## 2022-07-13 MED ORDER — BENRALIZUMAB 30 MG/ML ~~LOC~~ SOSY
30.0000 mg | PREFILLED_SYRINGE | Freq: Once | SUBCUTANEOUS | Status: AC
  Administered 2022-07-13: 30 mg via SUBCUTANEOUS
  Filled 2022-07-13: qty 1

## 2022-07-13 NOTE — Telephone Encounter (Signed)
Returned call to pt, she has been scheduled to see [name], NP, 07/16/22.

## 2022-07-13 NOTE — Progress Notes (Signed)
Diagnosis: Asthma  Provider:  Marshell Garfinkel MD  Procedure: Injection  Fasenra (Benralizumab), Dose: 30 mg, Site: subcutaneous, Number of injections: 1  Discharge: Condition: Good, Destination: Home . AVS provided to patient.   Performed by:  Koren Shiver, RN
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
## 2022-07-16 ENCOUNTER — Ambulatory Visit: Payer: Medicaid Other | Admitting: Physician Assistant

## 2022-07-16 DIAGNOSIS — M170 Bilateral primary osteoarthritis of knee: Secondary | ICD-10-CM | POA: Diagnosis not present

## 2022-07-16 NOTE — Telephone Encounter (Signed)
Patient has been scheduled for 08/05/22.

## 2022-07-16 NOTE — Telephone Encounter (Signed)
Patient has been scheduled for 08/05/2022.

## 2022-07-19 DIAGNOSIS — I5042 Chronic combined systolic (congestive) and diastolic (congestive) heart failure: Secondary | ICD-10-CM | POA: Diagnosis not present

## 2022-07-19 DIAGNOSIS — R7301 Impaired fasting glucose: Secondary | ICD-10-CM | POA: Diagnosis not present

## 2022-07-19 DIAGNOSIS — I517 Cardiomegaly: Secondary | ICD-10-CM | POA: Diagnosis not present

## 2022-07-19 DIAGNOSIS — D638 Anemia in other chronic diseases classified elsewhere: Secondary | ICD-10-CM | POA: Diagnosis not present

## 2022-07-19 DIAGNOSIS — Z13220 Encounter for screening for lipoid disorders: Secondary | ICD-10-CM | POA: Diagnosis not present

## 2022-07-19 DIAGNOSIS — I10 Essential (primary) hypertension: Secondary | ICD-10-CM | POA: Diagnosis not present

## 2022-07-20 ENCOUNTER — Other Ambulatory Visit (HOSPITAL_COMMUNITY): Payer: Self-pay

## 2022-07-20 DIAGNOSIS — M50123 Cervical disc disorder at C6-C7 level with radiculopathy: Secondary | ICD-10-CM | POA: Diagnosis not present

## 2022-07-20 DIAGNOSIS — M4722 Other spondylosis with radiculopathy, cervical region: Secondary | ICD-10-CM | POA: Diagnosis not present

## 2022-07-20 DIAGNOSIS — M4802 Spinal stenosis, cervical region: Secondary | ICD-10-CM | POA: Diagnosis not present

## 2022-07-20 DIAGNOSIS — M2578 Osteophyte, vertebrae: Secondary | ICD-10-CM | POA: Diagnosis not present

## 2022-07-27 ENCOUNTER — Ambulatory Visit: Payer: Medicaid Other | Admitting: Family Medicine

## 2022-07-29 ENCOUNTER — Ambulatory Visit: Payer: Medicaid Other | Admitting: Obstetrics

## 2022-08-03 ENCOUNTER — Ambulatory Visit: Payer: Medicaid Other | Admitting: Obstetrics

## 2022-08-05 ENCOUNTER — Encounter: Payer: Self-pay | Admitting: Physician Assistant

## 2022-08-05 ENCOUNTER — Ambulatory Visit: Payer: Medicaid Other | Attending: Physician Assistant | Admitting: Physician Assistant

## 2022-08-05 VITALS — BP 130/68 | HR 98 | Ht 60.0 in | Wt 170.2 lb

## 2022-08-05 DIAGNOSIS — R002 Palpitations: Secondary | ICD-10-CM | POA: Diagnosis not present

## 2022-08-05 DIAGNOSIS — J4540 Moderate persistent asthma, uncomplicated: Secondary | ICD-10-CM

## 2022-08-05 DIAGNOSIS — I5022 Chronic systolic (congestive) heart failure: Secondary | ICD-10-CM | POA: Diagnosis not present

## 2022-08-05 DIAGNOSIS — I10 Essential (primary) hypertension: Secondary | ICD-10-CM

## 2022-08-05 DIAGNOSIS — R072 Precordial pain: Secondary | ICD-10-CM

## 2022-08-05 MED ORDER — SPIRONOLACTONE 25 MG PO TABS: 25.0000 mg | ORAL_TABLET | Freq: Every day | ORAL | 3 refills | Status: AC

## 2022-08-05 MED ORDER — LOSARTAN POTASSIUM 100 MG PO TABS: 100.0000 mg | ORAL_TABLET | Freq: Every day | ORAL | 3 refills | Status: DC

## 2022-08-05 MED ORDER — ISOSORBIDE MONONITRATE ER 60 MG PO TB24: 60.0000 mg | ORAL_TABLET | Freq: Every day | ORAL | 3 refills | Status: DC

## 2022-08-05 MED ORDER — TORSEMIDE 20 MG PO TABS: 20.0000 mg | ORAL_TABLET | Freq: Two times a day (BID) | ORAL | 3 refills | Status: AC

## 2022-08-05 MED ORDER — METOPROLOL TARTRATE 50 MG PO TABS: 50.0000 mg | ORAL_TABLET | Freq: Two times a day (BID) | ORAL | 3 refills | Status: AC

## 2022-08-05 MED ORDER — ONDANSETRON HCL 4 MG PO TABS: ORAL_TABLET | ORAL | 0 refills | Status: AC

## 2022-08-05 MED ORDER — AMLODIPINE BESYLATE 5 MG PO TABS: 5.0000 mg | ORAL_TABLET | Freq: Every day | ORAL | 3 refills | Status: DC
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
## 2022-08-05 NOTE — Patient Instructions (Addendum)
Medication Instructions:  1.Increase metoprolol tartrate to 50 mg twice a day 2.Take an extra torsemide 20 mg as needed for lower extremity swelling 3.Take zofran 4 mg, one tablet on the night before your CT, one tablet on the morning of your CT and as needed for nausea thereafter.  *If you need a refill on your cardiac medications before your next appointment, please call your pharmacy*   Lab Work: None ordered If you have labs (blood work) drawn today and your tests are completely normal, you will receive your results only by: [place] (if you have MyChart) OR A paper copy in the mail If you have any lab test that is abnormal or we need to change your treatment, we will call you to review the results.   Testing/Procedures:   Your cardiac CT will be scheduled at one of the below locations:   [facility] [address] [place], [place] [address] (336) [place] [address] [place], [place] [address] [phone]  [facility] [place], [place] [address] [phone]  If scheduled at [facility], please arrive at the [facility] and Children's Entrance (Entrance C2) of [facility] 30 minutes prior to test start time. You can use the FREE valet parking offered at entrance C (encouraged to control the heart rate for the test)  Proceed to the [facility] Radiology Department (first floor) to check-in and test prep.  All radiology patients and guests should use entrance C2 at [facility], accessed from [facility], even though the hospital's physical address listed is [address]    If scheduled at [facility] or [facility], please arrive 15 mins early for check-in and test prep.   Please follow these instructions carefully (unless otherwise  directed):  Hold all erectile dysfunction medications at least 3 days (72 hrs) prior to test. (Ie viagra, cialis, sildenafil, tadalafil, etc) We will administer nitroglycerin during this exam.   On the Night Before the Test: Be sure to Drink plenty of water. Do not consume any caffeinated/decaffeinated beverages or chocolate 12 hours prior to your test. Do not take any antihistamines 12 hours prior to your test. If the patient has contrast allergy: Patient will need a prescription for Prednisone and very clear instructions (as follows): Prednisone 50 mg - take 13 hours prior to test Take another Prednisone 50 mg 7 hours prior to test Take another Prednisone 50 mg 1 hour prior to test Take Benadryl 50 mg 1 hour prior to test Patient must complete all four doses of above prophylactic medications. Patient will need a ride after test due to Benadryl.  On the Day of the Test: Drink plenty of water until 1 hour prior to the test. Do not eat any food 1 hour prior to test. You may take your regular medications prior to the test.  Take metoprolol (Lopressor) 100 mg two hours prior to test. HOLD Furosemide/Hydrochlorothiazide morning of the test. FEMALES- please wear underwire-free bra if available, avoid dresses & tight clothing.       After the Test: Drink plenty of water. After receiving IV contrast, you may experience a mild flushed feeling. This is normal. On occasion, you may experience a mild rash up to 24 hours after the test. This is not dangerous. If this occurs, you can take Benadryl 25 mg and increase your fluid  intake. If you experience trouble breathing, this can be serious. If it is severe call 911 IMMEDIATELY. If it is mild, please call our office. If you take any of these medications: Glipizide/Metformin, Avandament, Glucavance, please do not take 48 hours after completing test unless otherwise instructed.  We will call to schedule your test 2-4 weeks out understanding that  some insurance companies will need an authorization prior to the service being performed.   For non-scheduling related questions, please contact the cardiac imaging nurse navigator should you have any questions/concerns: Marchia Bond, Cardiac Imaging Nurse Navigator Gordy Clement, Cardiac Imaging Nurse Navigator [facility] Direct Office Dial: [phone]   For scheduling needs, including cancellations and rescheduling, please call [place], [phone].    Follow-Up: At [facility], you and your health needs are our priority.  As part of our continuing mission to provide you with exceptional heart care, we have created designated Provider Care Teams.  These Care Teams include your primary Cardiologist (physician) and Advanced Practice Providers (APPs -  Physician Assistants and Nurse Practitioners) who all work together to provide you with the care you need, when you need it.   Your next appointment:   3 month(s)  Provider:   Jenkins Rouge, MD    Other instructions: Check your blood pressure daily, one hour after taking your morning medications for 2 weeks, keep a log and send [place] the readings through your mychart.

## 2022-08-10 ENCOUNTER — Ambulatory Visit: Payer: Medicaid Other | Admitting: Family Medicine

## 2022-08-13 ENCOUNTER — Other Ambulatory Visit (HOSPITAL_COMMUNITY): Payer: Self-pay

## 2022-08-13 ENCOUNTER — Encounter: Payer: Self-pay | Admitting: Internal Medicine

## 2022-08-16 ENCOUNTER — Other Ambulatory Visit (HOSPITAL_COMMUNITY): Payer: Self-pay

## 2022-08-16 ENCOUNTER — Telehealth: Payer: Self-pay

## 2022-08-16 NOTE — Telephone Encounter (Signed)
PA request received via CMM for Zolpidem Tartrate 10MG tablets  PA has been submitted to [facility] and is pending determination.  Key: [id number] Passe

## 2022-08-18 ENCOUNTER — Other Ambulatory Visit: Payer: Self-pay | Admitting: Obstetrics

## 2022-08-18 NOTE — Telephone Encounter (Signed)
PA has been APPROVED from 08/16/2022-02/12/2023

## 2022-08-20 ENCOUNTER — Ambulatory Visit (INDEPENDENT_AMBULATORY_CARE_PROVIDER_SITE_OTHER): Payer: Medicaid Other | Admitting: Obstetrics

## 2022-08-20 ENCOUNTER — Encounter: Payer: Self-pay | Admitting: Obstetrics

## 2022-08-20 ENCOUNTER — Other Ambulatory Visit (HOSPITAL_COMMUNITY)
Admission: RE | Admit: 2022-08-20 | Discharge: 2022-08-20 | Disposition: A | Discharge status: Home / Self Care | Payer: Medicaid Other | Source: Ambulatory Visit | Attending: Obstetrics | Admitting: Obstetrics

## 2022-08-20 VITALS — BP 160/86 | HR 82 | Ht 60.0 in | Wt 176.6 lb

## 2022-08-20 DIAGNOSIS — E669 Obesity, unspecified: Secondary | ICD-10-CM | POA: Diagnosis not present

## 2022-08-20 DIAGNOSIS — N898 Other specified noninflammatory disorders of vagina: Secondary | ICD-10-CM

## 2022-08-20 DIAGNOSIS — Z113 Encounter for screening for infections with a predominantly sexual mode of transmission: Secondary | ICD-10-CM | POA: Diagnosis present

## 2022-08-20 DIAGNOSIS — Z01419 Encounter for gynecological examination (general) (routine) without abnormal findings: Secondary | ICD-10-CM | POA: Diagnosis present

## 2022-08-20 DIAGNOSIS — I10 Essential (primary) hypertension: Secondary | ICD-10-CM

## 2022-08-20 NOTE — Progress Notes (Signed)
Pt is in the office for annual Last pap 02/18/2021 Mammogram scheduled for 08/31/2022
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
## 2022-08-21 LAB — HEPATITIS C ANTIBODY: Hep C Virus Ab: NONREACTIVE

## 2022-08-21 LAB — HIV ANTIBODY (ROUTINE TESTING W REFLEX): HIV Screen 4th Generation wRfx: NONREACTIVE

## 2022-08-21 LAB — RPR: RPR Ser Ql: NONREACTIVE

## 2022-08-21 LAB — HEPATITIS B SURFACE ANTIGEN: Hepatitis B Surface Ag: NEGATIVE

## 2022-08-23 LAB — CERVICOVAGINAL ANCILLARY ONLY
Bacterial Vaginitis (gardnerella): NEGATIVE
Candida Glabrata: POSITIVE — AB
Candida Vaginitis: NEGATIVE
Chlamydia: NEGATIVE
Comment: NEGATIVE
Comment: NEGATIVE
Comment: NEGATIVE
Comment: NEGATIVE
Comment: NEGATIVE
Comment: NORMAL
Neisseria Gonorrhea: NEGATIVE
Trichomonas: NEGATIVE

## 2022-08-24 ENCOUNTER — Other Ambulatory Visit (HOSPITAL_COMMUNITY): Payer: Self-pay

## 2022-08-24 ENCOUNTER — Encounter: Payer: Self-pay | Admitting: Emergency Medicine

## 2022-08-24 ENCOUNTER — Other Ambulatory Visit: Payer: Self-pay | Admitting: Obstetrics

## 2022-08-24 ENCOUNTER — Telehealth: Payer: Self-pay | Admitting: Emergency Medicine

## 2022-08-24 DIAGNOSIS — B379 Candidiasis, unspecified: Secondary | ICD-10-CM

## 2022-08-24 DIAGNOSIS — B9689 Other specified bacterial agents as the cause of diseases classified elsewhere: Secondary | ICD-10-CM

## 2022-08-24 MED ORDER — METRONIDAZOLE 500 MG PO TABS: 500.0000 mg | ORAL_TABLET | Freq: Two times a day (BID) | ORAL | 2 refills | Status: DC

## 2022-08-24 MED ORDER — AZO BORIC ACID 600 MG VA SUPP: 1.0000 | Freq: Every day | VAGINAL | 0 refills | Status: DC

## 2022-08-24 NOTE — Telephone Encounter (Signed)
Attempted TC to patient, unable to leave message. Mailbox is full. MyChart message sent.

## 2022-08-24 NOTE — Telephone Encounter (Signed)
-----   Message from Shelly Bombard, MD sent at 08/24/2022 12:01 PM EST ----- Boric Acid vaginal suppositories x for Kelly Butler   Rx sent to [facility].

## 2022-08-25 LAB — CYTOLOGY - PAP
Comment: NEGATIVE
Diagnosis: NEGATIVE
High risk HPV: NEGATIVE

## 2022-08-27 ENCOUNTER — Other Ambulatory Visit (HOSPITAL_COMMUNITY): Payer: Self-pay

## 2022-08-30 ENCOUNTER — Other Ambulatory Visit (HOSPITAL_COMMUNITY): Payer: Self-pay

## 2022-08-31 ENCOUNTER — Other Ambulatory Visit (HOSPITAL_COMMUNITY): Payer: Self-pay

## 2022-08-31 ENCOUNTER — Ambulatory Visit: Payer: Medicaid Other | Admitting: Family Medicine

## 2022-08-31 ENCOUNTER — Ambulatory Visit: Payer: Medicaid Other

## 2022-09-01 ENCOUNTER — Other Ambulatory Visit: Payer: Self-pay

## 2022-09-01 ENCOUNTER — Other Ambulatory Visit (HOSPITAL_COMMUNITY): Payer: Self-pay

## 2022-09-04 ENCOUNTER — Other Ambulatory Visit: Payer: Self-pay | Admitting: Physician Assistant

## 2022-09-07 ENCOUNTER — Ambulatory Visit: Payer: Medicaid Other

## 2022-09-07 MED ORDER — BENRALIZUMAB 30 MG/ML ~~LOC~~ SOSY: 30.0000 mg | PREFILLED_SYRINGE | Freq: Once | SUBCUTANEOUS | Status: DC

## 2022-09-12 ENCOUNTER — Ambulatory Visit (HOSPITAL_BASED_OUTPATIENT_CLINIC_OR_DEPARTMENT_OTHER): Payer: Medicaid Other

## 2022-09-16 ENCOUNTER — Ambulatory Visit: Payer: Medicaid Other

## 2022-09-16 MED ORDER — BENRALIZUMAB 30 MG/ML ~~LOC~~ SOSY
30.0000 mg | PREFILLED_SYRINGE | Freq: Once | SUBCUTANEOUS | Status: DC
  Filled 2022-09-16: qty 1

## 2022-09-21 ENCOUNTER — Telehealth (HOSPITAL_COMMUNITY): Payer: Self-pay | Admitting: *Deleted

## 2022-09-21 NOTE — Telephone Encounter (Signed)
Attempted to call patient regarding upcoming cardiac CT appointment. °Left message on voicemail with name and callback number ° °Artha Chiasson RN Navigator Cardiac Imaging °[facility] °[phone] Office °[phone] Cell ° °

## 2022-09-22 ENCOUNTER — Ambulatory Visit (HOSPITAL_COMMUNITY)
Admission: RE | Admit: 2022-09-22 | Discharge: 2022-09-22 | Disposition: A | Discharge status: Home / Self Care | Payer: Medicaid Other | Source: Ambulatory Visit | Attending: Physician Assistant | Admitting: Physician Assistant

## 2022-09-23 ENCOUNTER — Ambulatory Visit (INDEPENDENT_AMBULATORY_CARE_PROVIDER_SITE_OTHER): Payer: Medicaid Other | Admitting: *Deleted

## 2022-09-23 VITALS — BP 145/78 | HR 93 | Temp 98.1°F | Resp 16 | Ht 60.0 in | Wt 178.2 lb

## 2022-09-23 DIAGNOSIS — J4550 Severe persistent asthma, uncomplicated: Secondary | ICD-10-CM

## 2022-09-23 MED ORDER — BENRALIZUMAB 30 MG/ML ~~LOC~~ SOSY
30.0000 mg | PREFILLED_SYRINGE | Freq: Once | SUBCUTANEOUS | Status: AC
  Administered 2022-09-23: 30 mg via SUBCUTANEOUS

## 2022-09-23 NOTE — Progress Notes (Signed)
Diagnosis: Asthma  Provider:  Marshell Garfinkel MD  Procedure: Injection  Fasenra (Benralizumab), Dose: 30 mg, Site: subcutaneous, Number of injections: 1  Post Care: Observation period completed  Discharge: Condition: Good, Destination: Home . AVS Provided and AVS Declined  Performed by:  Oren Beckmann, RN

## 2022-10-01 ENCOUNTER — Other Ambulatory Visit (HOSPITAL_COMMUNITY): Payer: Self-pay

## 2022-10-01 ENCOUNTER — Other Ambulatory Visit: Payer: Self-pay | Admitting: Cardiovascular Disease

## 2022-10-01 ENCOUNTER — Telehealth: Payer: Self-pay

## 2022-10-01 NOTE — Telephone Encounter (Signed)
PA request received via CMM for EPINEPHrine 0.3MG /0.3ML auto-injectors  PA not submitted due to test claim showing that [place] is covered for $4.00  Quantity limit of 6 pens/144 days  Key: [id number]
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
## 2022-10-12 ENCOUNTER — Telehealth (HOSPITAL_COMMUNITY): Payer: Self-pay | Admitting: *Deleted

## 2022-10-12 ENCOUNTER — Encounter: Payer: Self-pay | Admitting: Internal Medicine

## 2022-10-12 ENCOUNTER — Ambulatory Visit (INDEPENDENT_AMBULATORY_CARE_PROVIDER_SITE_OTHER): Payer: Medicaid Other | Admitting: Internal Medicine

## 2022-10-12 VITALS — BP 130/68 | HR 95 | Ht 60.0 in | Wt 181.4 lb

## 2022-10-12 DIAGNOSIS — J9611 Chronic respiratory failure with hypoxia: Secondary | ICD-10-CM

## 2022-10-12 DIAGNOSIS — J4540 Moderate persistent asthma, uncomplicated: Secondary | ICD-10-CM

## 2022-10-12 MED ORDER — MONTELUKAST SODIUM 10 MG PO TABS: 10.0000 mg | ORAL_TABLET | Freq: Every day | ORAL | 4 refills | Status: DC

## 2022-10-12 MED ORDER — ZOLPIDEM TARTRATE 10 MG PO TABS: ORAL_TABLET | ORAL | 1 refills | Status: DC

## 2022-10-12 MED ORDER — ALBUTEROL SULFATE HFA 108 (90 BASE) MCG/ACT IN AERS: INHALATION_SPRAY | RESPIRATORY_TRACT | 4 refills | Status: DC

## 2022-10-12 MED ORDER — IPRATROPIUM-ALBUTEROL 0.5-2.5 (3) MG/3ML IN SOLN: 3.0000 mL | RESPIRATORY_TRACT | 5 refills | Status: DC | PRN

## 2022-10-12 NOTE — Telephone Encounter (Signed)
Reaching out to patient to offer assistance regarding upcoming cardiac imaging study; pt verbalizes understanding of appt date/time, parking situation and where to check in, pre-test NPO status and medications ordered, and verified current allergies; name and call back number provided for further questions should they arise  Mikaelyn Arthurs RN Navigator Cardiac Imaging [facility] [phone] office [phone] cell  Patient to take 100mg metoprolol tartrate two hours prior to her cardiac CT scan. She is aware to arrive at 11:30 am. 

## 2022-10-12 NOTE — Patient Instructions (Signed)
Oxygen is helpful for you- keep wearing it 2 l for sleep  Harrington Challenger is helping- we will continue that  Meds refilled.  Please call if we can help

## 2022-10-12 NOTE — Assessment & Plan Note (Signed)
Currently controlled well with her nebulizer machine, rescue inhaler and Fasenra.  We discussed resumption of a maintenance controller and decided to watch for now, saving cost. Plan-continues Fasenra.  Inhalers refilled.
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
## 2022-10-12 NOTE — Assessment & Plan Note (Signed)
To benefit from oxygen for sleep, noting that she sleeps better and more restfully with it. Plan-continue oxygen 2 L for sleep

## 2022-10-12 NOTE — Assessment & Plan Note (Signed)
Chronic chest pains, probably more than 1 type.  There does seem to be a musculoskeletal component aggravated by stretching and deep breath.  She continues to follow with cardiology.

## 2022-10-13 ENCOUNTER — Other Ambulatory Visit (HOSPITAL_COMMUNITY): Payer: Self-pay

## 2022-10-14 ENCOUNTER — Inpatient Hospital Stay: Admission: RE | Admit: 2022-10-14 | Payer: Medicaid Other | Source: Ambulatory Visit

## 2022-10-14 ENCOUNTER — Ambulatory Visit (HOSPITAL_COMMUNITY): Admission: RE | Admit: 2022-10-14 | Payer: Medicaid Other | Source: Ambulatory Visit

## 2022-10-18 ENCOUNTER — Telehealth (HOSPITAL_COMMUNITY): Payer: Self-pay | Admitting: Emergency Medicine

## 2022-10-18 NOTE — Telephone Encounter (Signed)
Attempted to call patient regarding upcoming cardiac CT appointment. °Left message on voicemail with name and callback number °Arath Kaigler RN Navigator Cardiac Imaging °[facility] °[phone] Office °[phone] Cell ° °

## 2022-10-19 ENCOUNTER — Encounter (HOSPITAL_COMMUNITY): Payer: Self-pay

## 2022-10-19 ENCOUNTER — Ambulatory Visit (HOSPITAL_COMMUNITY): Admission: RE | Admit: 2022-10-19 | Payer: Medicaid Other | Source: Ambulatory Visit

## 2022-10-25 ENCOUNTER — Other Ambulatory Visit (HOSPITAL_COMMUNITY): Payer: Self-pay

## 2022-10-25 ENCOUNTER — Other Ambulatory Visit: Payer: Self-pay

## 2022-10-25 ENCOUNTER — Other Ambulatory Visit: Payer: Self-pay | Admitting: Internal Medicine

## 2022-10-25 DIAGNOSIS — J4551 Severe persistent asthma with (acute) exacerbation: Secondary | ICD-10-CM

## 2022-10-25 DIAGNOSIS — J4540 Moderate persistent asthma, uncomplicated: Secondary | ICD-10-CM

## 2022-10-25 MED ORDER — FASENRA PEN 30 MG/ML ~~LOC~~ SOAJ
SUBCUTANEOUS | 3 refills | Status: DC
  Filled 2022-10-25 – 2022-11-09 (×2): qty 1, fill #0
  Filled 2022-11-17: qty 1, 56d supply, fill #0
  Filled 2022-12-29 – 2023-02-23 (×3): qty 1, 56d supply, fill #1
  Filled 2023-04-14: qty 1, 56d supply, fill #2
  Filled 2023-06-16: qty 1, 56d supply, fill #3

## 2022-10-25 NOTE — Telephone Encounter (Signed)
Refill sent for [facility] to [facility]: [phone]   Dose: 30 mg SQ every 8 weeks  Last OV: 10/12/22 Provider: Dr. Maple Hudson  Next OV: 04/15/2023  Chesley Mires, PharmD, MPH, BCPS Clinical Pharmacist (Rheumatology and Pulmonology)

## 2022-10-27 ENCOUNTER — Telehealth (HOSPITAL_COMMUNITY): Payer: Self-pay | Admitting: Emergency Medicine

## 2022-10-27 NOTE — Telephone Encounter (Signed)
Attempted to call patient regarding upcoming cardiac CT appointment. °Left message on voicemail with name and callback number °Carlisha Wisler RN Navigator Cardiac Imaging °[facility] °[phone] Office °[phone] Cell ° °

## 2022-10-28 ENCOUNTER — Ambulatory Visit (HOSPITAL_COMMUNITY)
Admission: RE | Admit: 2022-10-28 | Discharge: 2022-10-28 | Disposition: A | Discharge status: Home / Self Care | Payer: Medicaid Other | Source: Ambulatory Visit | Attending: Physician Assistant | Admitting: Physician Assistant

## 2022-10-28 DIAGNOSIS — R072 Precordial pain: Secondary | ICD-10-CM

## 2022-10-28 MED ORDER — NITROGLYCERIN 0.4 MG SL SUBL
0.8000 mg | SUBLINGUAL_TABLET | Freq: Once | SUBLINGUAL | Status: AC
  Administered 2022-10-28: 0.8 mg via SUBLINGUAL

## 2022-10-28 MED ORDER — IOHEXOL 350 MG/ML SOLN
100.0000 mL | Freq: Once | INTRAVENOUS | Status: AC | PRN
  Administered 2022-10-28: 100 mL via INTRAVENOUS

## 2022-10-28 MED ORDER — NITROGLYCERIN 0.4 MG SL SUBL
SUBLINGUAL_TABLET | SUBLINGUAL | Status: AC
  Filled 2022-10-28: qty 2

## 2022-11-05 ENCOUNTER — Other Ambulatory Visit (HOSPITAL_COMMUNITY): Payer: Self-pay

## 2022-11-08 ENCOUNTER — Other Ambulatory Visit (HOSPITAL_COMMUNITY): Payer: Self-pay

## 2022-11-09 ENCOUNTER — Other Ambulatory Visit: Payer: Self-pay

## 2022-11-12 ENCOUNTER — Other Ambulatory Visit: Payer: Self-pay

## 2022-11-12 ENCOUNTER — Other Ambulatory Visit (HOSPITAL_COMMUNITY): Payer: Self-pay

## 2022-11-15 ENCOUNTER — Other Ambulatory Visit (HOSPITAL_COMMUNITY): Payer: Self-pay

## 2022-11-17 ENCOUNTER — Other Ambulatory Visit (HOSPITAL_COMMUNITY): Payer: Self-pay

## 2022-11-17 ENCOUNTER — Other Ambulatory Visit: Payer: Self-pay

## 2022-11-18 ENCOUNTER — Ambulatory Visit: Payer: Medicaid Other

## 2022-11-19 ENCOUNTER — Ambulatory Visit: Payer: Medicaid Other

## 2022-11-25 ENCOUNTER — Other Ambulatory Visit: Payer: Self-pay | Admitting: Obstetrics

## 2022-11-30 ENCOUNTER — Other Ambulatory Visit (HOSPITAL_COMMUNITY): Payer: Self-pay

## 2022-12-07 ENCOUNTER — Telehealth: Payer: Self-pay | Admitting: Internal Medicine

## 2022-12-07 ENCOUNTER — Ambulatory Visit: Payer: Medicaid Other

## 2022-12-07 MED ORDER — BENRALIZUMAB 30 MG/ML ~~LOC~~ SOSY
30.0000 mg | PREFILLED_SYRINGE | Freq: Once | SUBCUTANEOUS | Status: DC
  Filled 2022-12-07: qty 1

## 2022-12-07 NOTE — Telephone Encounter (Signed)
Called out to pt in regards to getting her scheduled for a Acute visit with Dr. Young per her MyChart Message of having continuous SOB and sharp pains. I offered her a visit w a NP today but she stated she didn't want to come out and hung up the phone. Attempted to call back 3x and no answer, also sent MyChart message      

## 2022-12-07 NOTE — Telephone Encounter (Signed)
ATC X1 LVM for patient to call the office back 

## 2022-12-07 NOTE — Telephone Encounter (Signed)
Called out to pt in regards to getting her scheduled for a Acute visit with Dr. Maple Hudson per her MyChart Message of having continuous SOB and sharp pains. I offered her a visit w a NP today but she stated she didn't want to come out and hung up the phone. Attempted to call back 3x and no answer, also sent MyChart message

## 2022-12-08 NOTE — Telephone Encounter (Signed)
ATC X1 LVM for patient to call the office. Closing encounter since patient has been called multiple times

## 2022-12-10 ENCOUNTER — Ambulatory Visit: Payer: Medicaid Other

## 2022-12-14 ENCOUNTER — Ambulatory Visit: Payer: Medicaid Other

## 2022-12-17 ENCOUNTER — Ambulatory Visit: Payer: Medicaid Other

## 2022-12-27 ENCOUNTER — Telehealth: Payer: Self-pay | Admitting: Pharmacist

## 2022-12-27 NOTE — Telephone Encounter (Signed)
Submitted a Prior Authorization RENEWAL request to [facility] for [facility] via CoverMyMeds. Will update once we receive a response.  Key: Lelon Perla, PharmD, MPH, BCPS, CPP Clinical Pharmacist (Rheumatology and Pulmonology)

## 2022-12-27 NOTE — Telephone Encounter (Signed)
Received notification from [facility] regarding a prior authorization for [facility]. Authorization has been APPROVED from 12/27/22 to 12/27/23. Approval letter sent to scan center.  Patient can fill through [facility]: [phone]   Authorization # [id number] Phone # [phone]  Chesley Mires, PharmD, MPH, BCPS, CPP Clinical Pharmacist (Rheumatology and Pulmonology)
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
## 2022-12-29 ENCOUNTER — Encounter: Payer: Self-pay | Admitting: Internal Medicine

## 2022-12-30 ENCOUNTER — Other Ambulatory Visit (HOSPITAL_COMMUNITY): Payer: Self-pay

## 2023-01-03 MED ORDER — MONTELUKAST SODIUM 10 MG PO TABS: 10.0000 mg | ORAL_TABLET | Freq: Every day | ORAL | 4 refills | Status: DC

## 2023-01-03 MED ORDER — IPRATROPIUM-ALBUTEROL 0.5-2.5 (3) MG/3ML IN SOLN: 3.0000 mL | RESPIRATORY_TRACT | 5 refills | Status: AC | PRN

## 2023-01-03 MED ORDER — ALBUTEROL SULFATE HFA 108 (90 BASE) MCG/ACT IN AERS: INHALATION_SPRAY | RESPIRATORY_TRACT | 4 refills | Status: DC

## 2023-01-04 ENCOUNTER — Ambulatory Visit: Payer: Medicaid Other | Admitting: Cardiovascular Disease

## 2023-01-04 ENCOUNTER — Ambulatory Visit (INDEPENDENT_AMBULATORY_CARE_PROVIDER_SITE_OTHER): Payer: Medicaid Other

## 2023-01-04 VITALS — BP 177/81 | HR 104 | Temp 99.1°F | Resp 20 | Ht 60.0 in | Wt 177.6 lb

## 2023-01-04 DIAGNOSIS — J4550 Severe persistent asthma, uncomplicated: Secondary | ICD-10-CM

## 2023-01-04 MED ORDER — BENRALIZUMAB 30 MG/ML ~~LOC~~ SOSY
30.0000 mg | PREFILLED_SYRINGE | Freq: Once | SUBCUTANEOUS | Status: AC
  Administered 2023-01-04: 30 mg via SUBCUTANEOUS
  Filled 2023-01-04: qty 1

## 2023-01-04 NOTE — Progress Notes (Signed)
Diagnosis: Asthma  Provider:  Chilton Greathouse MD  Procedure: Injection  Fasenra (Benralizumab), Dose: 30 mg, Site: subcutaneous, Number of injections: 1  Post Care:  n/a  Discharge: Condition: Good, Destination: Home . AVS Declined  Performed by:  Loney Hering, LPN

## 2023-01-12 ENCOUNTER — Other Ambulatory Visit: Payer: Self-pay

## 2023-01-12 ENCOUNTER — Telehealth: Payer: Medicaid Other

## 2023-01-20 ENCOUNTER — Other Ambulatory Visit: Payer: Self-pay

## 2023-01-21 ENCOUNTER — Ambulatory Visit: Payer: Medicaid Other

## 2023-01-28 ENCOUNTER — Encounter: Payer: Self-pay | Admitting: Internal Medicine

## 2023-01-28 ENCOUNTER — Encounter: Payer: Self-pay | Admitting: Obstetrics

## 2023-02-01 ENCOUNTER — Other Ambulatory Visit: Payer: Self-pay | Admitting: Cardiovascular Disease

## 2023-02-01 ENCOUNTER — Other Ambulatory Visit: Payer: Self-pay | Admitting: Internal Medicine

## 2023-02-01 ENCOUNTER — Other Ambulatory Visit: Payer: Self-pay | Admitting: Physician Assistant

## 2023-02-01 NOTE — Telephone Encounter (Signed)
Patient of Dr. Nishan. Please review for refill. Thank you!  

## 2023-02-16 ENCOUNTER — Other Ambulatory Visit: Payer: Self-pay

## 2023-02-17 ENCOUNTER — Other Ambulatory Visit (HOSPITAL_COMMUNITY): Payer: Self-pay

## 2023-02-21 ENCOUNTER — Encounter: Payer: Self-pay | Admitting: Internal Medicine

## 2023-02-21 ENCOUNTER — Other Ambulatory Visit: Payer: Self-pay

## 2023-02-21 ENCOUNTER — Telehealth: Payer: Self-pay | Admitting: *Deleted

## 2023-02-21 ENCOUNTER — Encounter: Payer: Self-pay | Admitting: Cardiovascular Disease

## 2023-02-21 NOTE — Telephone Encounter (Signed)
   Name: Kelly Butler  DOB: 12/22/1960  MRN: [id number]  Primary Cardiologist: Charlton Haws, MD   Preoperative team, please contact this patient and set up a phone call appointment for further preoperative risk assessment. Please obtain consent and complete medication review. Thank you for your help.  I confirm that guidance regarding antiplatelet and oral anticoagulation therapy has been completed and, if necessary, noted below.  None requested    Ronney Asters, NP 02/21/2023, 12:39 PM [facility]

## 2023-02-21 NOTE — Telephone Encounter (Signed)
Pt is scheduled for in-office visit 08/30 with Jari Favre, PA. I updated appt notes.

## 2023-02-21 NOTE — Telephone Encounter (Signed)
   Pre-operative Risk Assessment    Patient Name: Kelly Butler  DOB: 1960/10/29 MRN: [id number]      Request for Surgical Clearance    Procedure:   TKA (TOTAL KNEE ARTHROPLASTY)  Date of Surgery:  Clearance TBD                                 Surgeon:  DR. Georgena Spurling Surgeon's Group or Practice Name:  [facility]  Phone number:  [phone] ATTN: CHERYL LOVE Fax number:  [phone]   Type of Clearance Requested:   - Medical ; NO MEDICATIONS LISTED AS NEEDING TO BE HELD   Type of Anesthesia:  Spinal   Additional requests/questions:    Elpidio Anis   02/21/2023, 12:30 PM

## 2023-02-23 ENCOUNTER — Other Ambulatory Visit (HOSPITAL_COMMUNITY): Payer: Self-pay

## 2023-02-23 ENCOUNTER — Other Ambulatory Visit: Payer: Self-pay

## 2023-03-01 ENCOUNTER — Ambulatory Visit (INDEPENDENT_AMBULATORY_CARE_PROVIDER_SITE_OTHER): Payer: Medicaid Other

## 2023-03-01 VITALS — BP 112/71 | HR 75 | Temp 98.7°F | Resp 20 | Ht 60.0 in | Wt 178.4 lb

## 2023-03-01 DIAGNOSIS — J4550 Severe persistent asthma, uncomplicated: Secondary | ICD-10-CM

## 2023-03-01 MED ORDER — BENRALIZUMAB 30 MG/ML ~~LOC~~ SOSY
30.0000 mg | PREFILLED_SYRINGE | Freq: Once | SUBCUTANEOUS | Status: AC
  Administered 2023-03-01: 30 mg via SUBCUTANEOUS
  Filled 2023-03-01: qty 1

## 2023-03-01 NOTE — Progress Notes (Signed)
Diagnosis: Asthma  Provider:  Chilton Greathouse MD  Procedure: Injection  Fasenra (Benralizumab), Dose: 30 mg, Site: subcutaneous, Number of injections: 1  Administered in left arm per patient request.  Post Care: Patient declined observation  Discharge: Condition: Stable, Destination: Home . AVS Provided  Performed by:  Wyvonne Lenz, RN

## 2023-03-03 NOTE — Telephone Encounter (Signed)
Routing to Van for surgical clearance review.
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
## 2023-03-04 ENCOUNTER — Encounter: Payer: Self-pay | Admitting: Physician Assistant

## 2023-03-04 ENCOUNTER — Encounter (HOSPITAL_COMMUNITY): Payer: Self-pay

## 2023-03-04 ENCOUNTER — Ambulatory Visit: Payer: Medicaid Other | Admitting: Physician Assistant

## 2023-03-04 ENCOUNTER — Encounter: Payer: Self-pay | Admitting: *Deleted

## 2023-03-04 VITALS — BP 124/68 | HR 84 | Ht 60.0 in | Wt 178.2 lb

## 2023-03-04 DIAGNOSIS — R0602 Shortness of breath: Secondary | ICD-10-CM | POA: Diagnosis not present

## 2023-03-04 DIAGNOSIS — I5020 Unspecified systolic (congestive) heart failure: Secondary | ICD-10-CM | POA: Diagnosis not present

## 2023-03-04 DIAGNOSIS — I420 Dilated cardiomyopathy: Secondary | ICD-10-CM

## 2023-03-04 DIAGNOSIS — E785 Hyperlipidemia, unspecified: Secondary | ICD-10-CM

## 2023-03-04 DIAGNOSIS — I10 Essential (primary) hypertension: Secondary | ICD-10-CM

## 2023-03-04 DIAGNOSIS — R072 Precordial pain: Secondary | ICD-10-CM

## 2023-03-04 DIAGNOSIS — R002 Palpitations: Secondary | ICD-10-CM

## 2023-03-04 MED ORDER — ISOSORBIDE MONONITRATE ER 60 MG PO TB24: 90.0000 mg | ORAL_TABLET | Freq: Every day | ORAL | 3 refills | Status: AC

## 2023-03-04 MED ORDER — NITROGLYCERIN 0.4 MG SL SUBL: SUBLINGUAL_TABLET | SUBLINGUAL | 3 refills | Status: AC

## 2023-03-04 NOTE — Patient Instructions (Signed)
Medication Instructions:  Increase imdur to 90 mg daily, this will be one and one-half of the 60 mg tablet daily. *If you need a refill on your cardiac medications before your next appointment, please call your pharmacy*  Lab Work: None If you have labs (blood work) drawn today and your tests are completely normal, you will receive your results only by: MyChart Message (if you have MyChart) OR A paper copy in the mail If you have any lab test that is abnormal or we need to change your treatment, we will call you to review the results.  Testing/Procedures: Your physician has requested that you have a lexiscan myoview. For further information please visit [email or web address] Please follow instruction sheet, as given.   Follow-Up: At [facility], you and your health needs are our priority.  As part of our continuing mission to provide you with exceptional heart care, we have created designated Provider Care Teams.  These Care Teams include your primary Cardiologist (physician) and Advanced Practice Providers (APPs -  Physician Assistants and Nurse Practitioners) who all work together to provide you with the care you need, when you need it.  Your next appointment:   Next available   Provider:   Charlton Haws, MD     Heart-Healthy Eating Plan Many factors influence your heart health, including eating and exercise habits. Heart health is also called coronary health. Coronary risk increases with abnormal blood fat (lipid) levels. A heart-healthy eating plan includes limiting unhealthy fats, increasing healthy fats, limiting salt (sodium) intake, and making other diet and lifestyle changes. What is my plan? Your health care provider may recommend that: You limit your fat intake to _________% or less of your total calories each day. You limit your saturated fat intake to _________% or less of your total calories each day. You limit the amount of cholesterol in your diet to less than  _________ mg per day. You limit the amount of sodium in your diet to less than _________ mg per day. What are tips for following this plan? Cooking Cook foods using methods other than frying. Baking, boiling, grilling, and broiling are all good options. Other ways to reduce fat include: Removing the skin from poultry. Removing all visible fats from meats. Steaming vegetables in water or broth. Meal planning  At meals, imagine dividing your plate into fourths: Fill one-half of your plate with vegetables and green salads. Fill one-fourth of your plate with whole grains. Fill one-fourth of your plate with lean protein foods. Eat 2-4 cups of vegetables per day. One cup of vegetables equals 1 cup (91 g) broccoli or cauliflower florets, 2 medium carrots, 1 large bell pepper, 1 large sweet potato, 1 large tomato, 1 medium white potato, 2 cups (150 g) raw leafy greens. Eat 1-2 cups of fruit per day. One cup of fruit equals 1 small apple, 1 large banana, 1 cup (237 g) mixed fruit, 1 large orange,  cup (82 g) dried fruit, 1 cup (240 mL) 100% fruit juice. Eat more foods that contain soluble fiber. Examples include apples, broccoli, carrots, beans, peas, and barley. Aim to get 25-30 g of fiber per day. Increase your consumption of legumes, nuts, and seeds to 4-5 servings per week. One serving of dried beans or legumes equals  cup (90 g) cooked, 1 serving of nuts is  oz (12 almonds, 24 pistachios, or 7 walnut halves), and 1 serving of seeds equals  oz (8 g). Fats Choose healthy fats more often. Choose monounsaturated and polyunsaturated  fats, such as olive and canola oils, avocado oil, flaxseeds, walnuts, almonds, and seeds. Eat more omega-3 fats. Choose salmon, mackerel, sardines, tuna, flaxseed oil, and ground flaxseeds. Aim to eat fish at least 2 times each week. Check food labels carefully to identify foods with trans fats or high amounts of saturated fat. Limit saturated fats. These are found in  animal products, such as meats, butter, and cream. Plant sources of saturated fats include palm oil, palm kernel oil, and coconut oil. Avoid foods with partially hydrogenated oils in them. These contain trans fats. Examples are stick margarine, some tub margarines, cookies, crackers, and other baked goods. Avoid fried foods. General information Eat more home-cooked food and less restaurant, buffet, and fast food. Limit or avoid alcohol. Limit foods that are high in added sugar and simple starches such as foods made using white refined flour (white breads, pastries, sweets). Lose weight if you are overweight. Losing just 5-10% of your body weight can help your overall health and prevent diseases such as diabetes and heart disease. Monitor your sodium intake, especially if you have high blood pressure. Talk with your health care provider about your sodium intake. Try to incorporate more vegetarian meals weekly. What foods should I eat? Fruits All fresh, canned (in natural juice), or frozen fruits. Vegetables Fresh or frozen vegetables (raw, steamed, roasted, or grilled). Green salads. Grains Most grains. Choose whole wheat and whole grains most of the time. Rice and pasta, including brown rice and pastas made with whole wheat. Meats and other proteins Lean, well-trimmed beef, veal, pork, and lamb. Chicken and [place] without skin. All fish and shellfish. Wild duck, rabbit, pheasant, and venison. Egg whites or low-cholesterol egg substitutes. Dried beans, peas, lentils, and tofu. Seeds and most nuts. Dairy Low-fat or nonfat cheeses, including ricotta and mozzarella. Skim or 1% milk (liquid, powdered, or evaporated). Buttermilk made with low-fat milk. Nonfat or low-fat yogurt. Fats and oils Non-hydrogenated (trans-free) margarines. Vegetable oils, including soybean, sesame, sunflower, olive, avocado, peanut, safflower, corn, canola, and cottonseed. Salad dressings or mayonnaise made with a vegetable  oil. Beverages Water (mineral or sparkling). Coffee and tea. Unsweetened ice tea. Diet beverages. Sweets and desserts Sherbet, gelatin, and fruit ice. Small amounts of dark chocolate. Limit all sweets and desserts. Seasonings and condiments All seasonings and condiments. The items listed above may not be a complete list of foods and beverages you can eat. Contact a dietitian for more options. What foods should I avoid? Fruits Canned fruit in heavy syrup. Fruit in cream or butter sauce. Fried fruit. Limit coconut. Vegetables Vegetables cooked in cheese, cream, or butter sauce. Fried vegetables. Grains Breads made with saturated or trans fats, oils, or whole milk. Croissants. Sweet rolls. Donuts. High-fat crackers, such as cheese crackers and chips. Meats and other proteins Fatty meats, such as hot dogs, ribs, sausage, bacon, rib-eye roast or steak. High-fat deli meats, such as salami and bologna. Caviar. Domestic duck and goose. Organ meats, such as liver. Dairy Cream, sour cream, cream cheese, and creamed cottage cheese. Whole-milk cheeses. Whole or 2% milk (liquid, evaporated, or condensed). Whole buttermilk. Cream sauce or high-fat cheese sauce. Whole-milk yogurt. Fats and oils Meat fat, or shortening. Cocoa butter, hydrogenated oils, palm oil, coconut oil, palm kernel oil. Solid fats and shortenings, including bacon fat, salt pork, lard, and butter. Nondairy cream substitutes. Salad dressings with cheese or sour cream. Beverages Regular sodas and any drinks with added sugar. Sweets and desserts Frosting. Pudding. Cookies. Cakes. Pies. Milk chocolate or white  chocolate. Buttered syrups. Full-fat ice cream or ice cream drinks. The items listed above may not be a complete list of foods and beverages to avoid. Contact a dietitian for more information. Summary Heart-healthy meal planning includes limiting unhealthy fats, increasing healthy fats, limiting salt (sodium) intake and making  other diet and lifestyle changes. Lose weight if you are overweight. Losing just 5-10% of your body weight can help your overall health and prevent diseases such as diabetes and heart disease. Focus on eating a balance of foods, including fruits and vegetables, low-fat or nonfat dairy, lean protein, nuts and legumes, whole grains, and heart-healthy oils and fats. This information is not intended to replace advice given to you by your health care provider. Make sure you discuss any questions you have with your health care provider. Document Revised: 07/27/2021 Document Reviewed: 07/27/2021 Elsevier Patient Education  2024 Elsevier Inc. Low-Sodium Eating Plan Salt (sodium) helps you keep a healthy balance of fluids in your body. Too much sodium can raise your blood pressure. It can also cause fluid and waste to be held in your body. Your health care provider or dietitian may recommend a low-sodium eating plan if you have high blood pressure (hypertension), kidney disease, liver disease, or heart failure. Eating less sodium can help lower your blood pressure and reduce swelling. It can also protect your heart, liver, and kidneys. What are tips for following this plan? Reading food labels  Check food labels for the amount of sodium per serving. If you eat more than one serving, you must multiply the listed amount by the number of servings. Choose foods with less than 140 milligrams (mg) of sodium per serving. Avoid foods with 300 mg of sodium or more per serving. Always check how much sodium is in a product, even if the label says "unsalted" or "no salt added." Shopping  Buy products labeled as "low-sodium" or "no salt added." Buy fresh foods. Avoid canned foods and pre-made or frozen meals. Avoid canned, cured, or processed meats. Buy breads that have less than 80 mg of sodium per slice. Cooking  Eat more home-cooked food. Try to eat less restaurant, buffet, and fast food. Try not to add salt  when you cook. Use salt-free seasonings or herbs instead of table salt or sea salt. Check with your provider or pharmacist before using salt substitutes. Cook with plant-based oils, such as canola, sunflower, or olive oil. Meal planning When eating at a restaurant, ask if your food can be made with less salt or no salt. Avoid dishes labeled as brined, pickled, cured, or smoked. Avoid dishes made with soy sauce, miso, or teriyaki sauce. Avoid foods that have monosodium glutamate (MSG) in them. MSG may be added to some restaurant food, sauces, soups, bouillon, and canned foods. Make meals that can be grilled, baked, poached, roasted, or steamed. These are often made with less sodium. General information Try to limit your sodium intake to 1,500-2,300 mg each day, or the amount told by your provider. What foods should I eat? Fruits Fresh, frozen, or canned fruit. Fruit juice. Vegetables Fresh or frozen vegetables. "No salt added" canned vegetables. "No salt added" tomato sauce and paste. Low-sodium or reduced-sodium tomato and vegetable juice. Grains Low-sodium cereals, such as oats, puffed wheat and rice, and shredded wheat. Low-sodium crackers. Unsalted rice. Unsalted pasta. Low-sodium bread. Whole grain breads and whole grain pasta. Meats and other proteins Fresh or frozen meat, poultry, seafood, and fish. These should have no added salt. Low-sodium canned tuna and  salmon. Unsalted nuts. Dried peas, beans, and lentils without added salt. Unsalted canned beans. Eggs. Unsalted nut butters. Dairy Milk. Soy milk. Cheese that is naturally low in sodium, such as ricotta cheese, fresh mozzarella, or Swiss cheese. Low-sodium or reduced-sodium cheese. Cream cheese. Yogurt. Seasonings and condiments Fresh and dried herbs and spices. Salt-free seasonings. Low-sodium mustard and ketchup. Sodium-free salad dressing. Sodium-free light mayonnaise. Fresh or refrigerated horseradish. Lemon juice. Vinegar. Other  foods Homemade, reduced-sodium, or low-sodium soups. Unsalted popcorn and pretzels. Low-salt or salt-free chips. The items listed above may not be all the foods and drinks you can have. Talk to a dietitian to learn more. What foods should I avoid? Vegetables Sauerkraut, pickled vegetables, and relishes. Olives. [place] fries. Onion rings. Regular canned vegetables, except low-sodium or reduced-sodium items. Regular canned tomato sauce and paste. Regular tomato and vegetable juice. Frozen vegetables in sauces. Grains Instant hot cereals. Bread stuffing, pancake, and biscuit mixes. Croutons. Seasoned rice or pasta mixes. Noodle soup cups. Boxed or frozen macaroni and cheese. Regular salted crackers. Self-rising flour. Meats and other proteins Meat or fish that is salted, canned, smoked, spiced, or pickled. Precooked or cured meat, such as sausages or meat loaves. Tomasa Blase. Ham. Pepperoni. Hot dogs. Corned beef. Chipped beef. Salt pork. Jerky. Pickled herring, anchovies, and sardines. Regular canned tuna. Salted nuts. Dairy Processed cheese and cheese spreads. Hard cheeses. Cheese curds. Blue cheese. Feta cheese. String cheese. Regular cottage cheese. Buttermilk. Canned milk. Fats and oils Salted butter. Regular margarine. Ghee. Bacon fat. Seasonings and condiments Onion salt, garlic salt, seasoned salt, table salt, and sea salt. Canned and packaged gravies. Worcestershire sauce. Tartar sauce. Barbecue sauce. Teriyaki sauce. Soy sauce, including reduced-sodium soy sauce. Steak sauce. Fish sauce. Oyster sauce. Cocktail sauce. Horseradish that you find on the shelf. Regular ketchup and mustard. Meat flavorings and tenderizers. Bouillon cubes. Hot sauce. Pre-made or packaged marinades. Pre-made or packaged taco seasonings. Relishes. Regular salad dressings. Salsa. Other foods Salted popcorn and pretzels. Corn chips and puffs. Potato and tortilla chips. Canned or dried soups. Pizza. Frozen entrees and pot  pies. The items listed above may not be all the foods and drinks you should avoid. Talk to a dietitian to learn more. This information is not intended to replace advice given to you by your health care provider. Make sure you discuss any questions you have with your health care provider. Document Revised: 07/08/2022 Document Reviewed: 07/08/2022 Elsevier Patient Education  2024 [employer].

## 2023-03-10 ENCOUNTER — Ambulatory Visit (HOSPITAL_COMMUNITY): Payer: Medicaid Other

## 2023-03-15 ENCOUNTER — Encounter (HOSPITAL_COMMUNITY): Payer: Medicaid Other

## 2023-03-15 ENCOUNTER — Encounter (HOSPITAL_COMMUNITY): Payer: Self-pay

## 2023-03-15 ENCOUNTER — Encounter (HOSPITAL_COMMUNITY): Payer: Self-pay | Admitting: Physician Assistant

## 2023-03-15 ENCOUNTER — Ambulatory Visit (HOSPITAL_COMMUNITY): Payer: Medicaid Other | Attending: Physician Assistant

## 2023-03-21 ENCOUNTER — Encounter (HOSPITAL_COMMUNITY): Payer: Self-pay | Admitting: *Deleted

## 2023-03-21 ENCOUNTER — Other Ambulatory Visit (HOSPITAL_COMMUNITY): Payer: Self-pay

## 2023-03-24 ENCOUNTER — Ambulatory Visit (HOSPITAL_COMMUNITY): Payer: Medicaid Other | Attending: Physician Assistant

## 2023-03-25 ENCOUNTER — Telehealth (HOSPITAL_COMMUNITY): Payer: Self-pay | Admitting: *Deleted

## 2023-03-25 NOTE — Telephone Encounter (Signed)
Attempted returned call to patient to reschedule stress test appointment.  No answer and no way to leave a message.

## 2023-03-29 ENCOUNTER — Encounter (HOSPITAL_COMMUNITY): Payer: Self-pay

## 2023-03-31 ENCOUNTER — Encounter: Payer: Self-pay | Admitting: Cardiovascular Disease

## 2023-03-31 ENCOUNTER — Other Ambulatory Visit (HOSPITAL_COMMUNITY): Payer: Self-pay | Admitting: Physician Assistant

## 2023-03-31 ENCOUNTER — Ambulatory Visit (HOSPITAL_COMMUNITY): Payer: Medicaid Other | Attending: Physician Assistant

## 2023-03-31 ENCOUNTER — Encounter: Payer: Self-pay | Admitting: Physician Assistant

## 2023-03-31 DIAGNOSIS — R079 Chest pain, unspecified: Secondary | ICD-10-CM | POA: Insufficient documentation

## 2023-03-31 LAB — MYOCARDIAL PERFUSION IMAGING
LV dias vol: 79 mL (ref 46–106)
LV sys vol: 33 mL
Nuc Stress EF: 59 %
Peak HR: 96 {beats}/min
Rest HR: 73 {beats}/min
Rest Nuclear Isotope Dose: 10.3 mCi
SDS: 0
SRS: 0
SSS: 0
ST Depression (mm): 0 mm
Stress Nuclear Isotope Dose: 29.8 mCi
TID: 1.03

## 2023-03-31 MED ORDER — REGADENOSON 0.4 MG/5ML IV SOLN: 0.4000 mg | Freq: Once | INTRAVENOUS | Status: AC

## 2023-03-31 MED ORDER — TECHNETIUM TC 99M TETROFOSMIN IV KIT: 29.8000 | PACK | Freq: Once | INTRAVENOUS | Status: AC | PRN

## 2023-03-31 MED ORDER — TECHNETIUM TC 99M TETROFOSMIN IV KIT
10.3000 | PACK | Freq: Once | INTRAVENOUS | Status: AC | PRN
  Administered 2023-03-31: 10.3 via INTRAVENOUS

## 2023-04-12 ENCOUNTER — Other Ambulatory Visit: Payer: Self-pay | Admitting: Orthopedic Surgery

## 2023-04-12 ENCOUNTER — Telehealth: Payer: Self-pay | Admitting: Cardiovascular Disease

## 2023-04-12 NOTE — Telephone Encounter (Signed)
Pre-operative Risk Assessment    Patient Name: Kelly Butler  DOB: [birth year] MRN: [id number]      Request for Surgical Clearance    Procedure:   Right Carpal Tunnel Release  Date of Surgery:  Clearance 05/26/23                                 Surgeon:  Dr. Betha Loa Surgeon's Group or Practice Name:  [facility] Phone number:  [phone] Fax number:  [phone]   Type of Clearance Requested:   - Medical    Type of Anesthesia:   Choice   Additional requests/questions:    Minna Antis   04/12/2023, 3:01 PM

## 2023-04-13 ENCOUNTER — Other Ambulatory Visit (HOSPITAL_COMMUNITY): Payer: Self-pay

## 2023-04-13 NOTE — Telephone Encounter (Signed)
Name: Kelly Butler  DOB: 1961/07/03  MRN: [id number]  Primary Cardiologist: Charlton Haws, MD  Chart reviewed as part of pre-operative protocol coverage. Because of Kelly Butler past medical history and time since last visit, she will require a follow-up in-office visit in order to better assess preoperative cardiovascular risk.  Pre-op covering staff: - Please schedule appointment and call patient to inform them. If patient already had an upcoming appointment within acceptable timeframe, please add "pre-op clearance" to the appointment notes so provider is aware. - Please contact requesting surgeon's office via preferred method (i.e, phone, fax) to inform them of need for appointment prior to surgery.  ADDENDUM 04/14/2023: Pt declined office visit.  See updated clearance notes.  Kelly Grapes, NP  04/13/2023, 12:11 PM

## 2023-04-14 ENCOUNTER — Other Ambulatory Visit: Payer: Self-pay

## 2023-04-14 ENCOUNTER — Other Ambulatory Visit (HOSPITAL_COMMUNITY): Payer: Self-pay | Admitting: Pharmacy Technician

## 2023-04-14 ENCOUNTER — Other Ambulatory Visit (HOSPITAL_COMMUNITY): Payer: Self-pay

## 2023-04-14 NOTE — Progress Notes (Signed)
Specialty Pharmacy Refill Coordination Note  Kelly Butler is a 62 y.o. female assessed today regarding refills of clinic administered specialty medication(s) Benralizumab   Clinic requested Courier to Provider Office   Delivery date: 04/21/23   Verified address: [facility]  [address]   Medication will be filled on 04/20/23.  Spoke with Arminda Resides

## 2023-04-14 NOTE — Telephone Encounter (Signed)
This pt has a pre op that says pt needs an in office appt. This pt had a in office appt with tessa on 8/30 for pre op for a knee clearance. Than pt had to have a lexi on 9/26 for the knee surgery this was never sent to surgeons office and now pt is trying to have another surgey that is in pool. when I tried to set pt up for in office appt she said already cleared. Please advise.  Sent Bernadene Person, NP.

## 2023-04-14 NOTE — Telephone Encounter (Signed)
Name: Kelly Butler  DOB: [birth year]  MRN: [id number]   Primary Cardiologist: Charlton Haws, MD  Chart reviewed as part of pre-operative protocol coverage. Patient was contacted 04/14/2023 in reference to pre-operative risk assessment for pending surgery as outlined below.  Kelly Butler was last seen on 03/04/2023 by Jari Favre, PA.  She reported new chest pain, Imdur was increased, Lexiscan Myoview was negative for ischemia. Since that day, Kelly Butler has been stable from a cardiac standpoint.  She denies any new symptoms or concerns.  Therefore, based on ACC/AHA guidelines, the patient would be at acceptable risk for the planned procedure without further cardiovascular testing.   The patient was advised that if she develops new symptoms prior to surgery to contact our office to arrange for a follow-up visit, and she verbalized understanding.  I will route this recommendation to the requesting party via Epic fax function and remove from pre-op pool. Please call with questions.  Joylene Grapes, NP 04/14/2023, 3:29 PM
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
## 2023-04-14 NOTE — Telephone Encounter (Signed)
Name: Regenna Tedrick  DOB: 03-Apr-1961  MRN: [id number]   Primary Cardiologist: Charlton Haws, MD  Chart reviewed as part of pre-operative protocol coverage. Patient was contacted 04/14/2023 in reference to pre-operative risk assessment for pending surgery as outlined below.  Reyanne Oldt was last seen on 03/04/2023 by Jari Favre, PA.  She reported new chest pain, Imdur was increased, Lexiscan Myoview was negative for ischemia. Since that day, Averey Tonjes has been stable from a cardiac standpoint.  She denies any new symptoms or concerns.   Therefore, based on ACC/AHA guidelines, the patient would be at acceptable risk for the planned procedure without further cardiovascular testing.    The patient was advised that if she develops new symptoms prior to surgery to contact our office to arrange for a follow-up visit, and she verbalized understanding.   I will route this recommendation to the requesting party via Epic fax function and remove from pre-op pool. Please call with questions.  Joylene Grapes, NP 04/14/2023, 3:33 PM

## 2023-04-15 ENCOUNTER — Ambulatory Visit: Payer: Medicaid Other | Admitting: Internal Medicine

## 2023-04-22 ENCOUNTER — Telehealth: Payer: Self-pay | Admitting: Internal Medicine

## 2023-04-22 NOTE — Telephone Encounter (Signed)
Fax received from Dr. Merlyn Lot with [facility] to perform a carpal tunnel release on patient.  Patient needs surgery clearance. Surgery is 05/26/23. Patient was seen on 10/12/22. Office protocol is a risk assessment can be sent to surgeon if patient has been seen in 60 days or less.    Dr. Maple Hudson- the pt is scheduled fo see you next in Dec 2024, however, her surgery is scheduled for 05/26/23. Are you okay to clear her based off of the last visit with you, or could we use a held slot to see her sooner and get her cleared?   Please advise, thanks!

## 2023-04-25 NOTE — Telephone Encounter (Signed)
She missed her las appointment and should bee seen for clearance- ok se see an APP or to use a held spot with me. Thanks

## 2023-04-26 MED ORDER — BENRALIZUMAB 30 MG/ML ~~LOC~~ SOSY
30.0000 mg | PREFILLED_SYRINGE | Freq: Once | SUBCUTANEOUS | Status: DC
  Filled 2023-04-26: qty 1

## 2023-04-26 NOTE — Telephone Encounter (Signed)
Appt scheduled for Nov 1 at 11:30 with Dr Maple Hudson.

## 2023-05-02 ENCOUNTER — Ambulatory Visit (INDEPENDENT_AMBULATORY_CARE_PROVIDER_SITE_OTHER): Payer: Medicaid Other

## 2023-05-02 VITALS — BP 165/92 | HR 96 | Temp 98.0°F | Resp 20 | Ht 60.0 in | Wt 190.2 lb

## 2023-05-02 DIAGNOSIS — J4550 Severe persistent asthma, uncomplicated: Secondary | ICD-10-CM

## 2023-05-02 MED ORDER — BENRALIZUMAB 30 MG/ML ~~LOC~~ SOSY
30.0000 mg | PREFILLED_SYRINGE | Freq: Once | SUBCUTANEOUS | Status: AC
  Administered 2023-05-02: 30 mg via SUBCUTANEOUS

## 2023-05-02 NOTE — Progress Notes (Signed)
Diagnosis: Asthma  Provider:  Chilton Greathouse MD  Procedure: Injection  Fasenra (Benralizumab), Dose: 30 mg, Site: subcutaneous, Number of injections: 1  Administered in left arm.  Post Care: Patient declined observation  Discharge: Condition: Stable, Destination: Home . AVS Provided  Performed by:  Wyvonne Lenz, RN

## 2023-05-04 ENCOUNTER — Other Ambulatory Visit: Payer: Self-pay

## 2023-05-04 ENCOUNTER — Other Ambulatory Visit: Payer: Self-pay | Admitting: Internal Medicine

## 2023-05-04 NOTE — Progress Notes (Signed)
28 day recurrence was not ended in assessment.
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
## 2023-05-06 ENCOUNTER — Ambulatory Visit: Payer: Medicaid Other | Admitting: Internal Medicine

## 2023-05-06 NOTE — Telephone Encounter (Signed)
Ambien refilled

## 2023-05-23 ENCOUNTER — Telehealth: Payer: Self-pay

## 2023-05-23 NOTE — Telephone Encounter (Signed)
error 

## 2023-05-24 ENCOUNTER — Encounter (HOSPITAL_COMMUNITY): Payer: Self-pay | Admitting: Orthopedic Surgery

## 2023-05-24 ENCOUNTER — Other Ambulatory Visit: Payer: Self-pay

## 2023-05-24 NOTE — Progress Notes (Addendum)
PCP - Ladora Daniel, PA-C Cardiologist - Wendall Stade, MD Pulmonologist- Dr. Jetty Duhamel   PPM/ICD - denies   Chest x-ray - 04/12/22 EKG - 03/04/23 Stress Test - 03/31/23 ECHO - 07/02/22 Cardiac Cath - 06/17/17  CPAP - OSA+, denies  DM- denies  ASA/Blood Thinner Instructions: n/a   ERAS Protcol - clears until 0600  COVID TEST- n/a  Anesthesia review: yes, cardiac hx, pulmonary hx, O2 2L PRN  Patient verbally denies any shortness of breath, fever, cough and chest pain during phone call  Pt was not very forthcoming with information. She was audibly agitated that I had to ask her questions about her medical history. She said she last took nitroglycerin on 11/18. Pt was instructed to call and report this to [place] if she needs to take it again. This was reported to [place], [place].    -------------  SDW INSTRUCTIONS given:  Your procedure is scheduled on 11/21.  Report to [facility] Main Entrance "A" at 0630 A.M., and check in at the Admitting office.  Call this number if you have problems the morning of surgery:  [phone]   Remember:  Do not eat after midnight the night before your surgery  You may drink clear liquids until 0600 AM the morning of your surgery.   Clear liquids allowed are: Water, Non-Citrus Juices (without pulp), Carbonated Beverages, Clear Tea, Black Coffee Only, and Gatorade    Take these medicines the morning of surgery with A SIP OF WATER  Albuterol PRN- bring inhaler with you Amlodipine Baclofen Clonidine EQ allergy relief Duoneb PRN Imdur Metoprolol Singulair Prilosec Zofran PRN Eye drops Tizanidine Tramadol PRN Nitroglycerin PRN- If you have to take this medication prior to surgery, please call [phone] and report this to a nurse   As of today, STOP taking any Aspirin (unless otherwise instructed by your surgeon) Aleve, Naproxen, Ibuprofen, Motrin, Advil, Goody's, BC's, all herbal medications, fish oil, and all vitamins.                       Do not wear jewelry, make up, or nail polish            Do not wear lotions, powders, perfumes/colognes, or deodorant.            Do not shave 48 hours prior to surgery.  Men may shave face and neck.            Do not bring valuables to the hospital.            [facility] is not responsible for any belongings or valuables.  Do NOT Smoke (Tobacco/Vaping) 24 hours prior to your procedure If you use a CPAP at night, you may bring all equipment for your overnight stay.   Contacts, glasses, dentures or bridgework may not be worn into surgery.      For patients admitted to the hospital, discharge time will be determined by your treatment team.   Patients discharged the day of surgery will not be allowed to drive home, and someone needs to stay with them for 24 hours.    Special instructions:   [facility]- Preparing For Surgery  Before surgery, you can play an important role. Because skin is not sterile, your skin needs to be as free of germs as possible. You can reduce the number of germs on your skin by washing with CHG (chlorahexidine gluconate) Soap before surgery.  CHG is an antiseptic cleaner which kills germs and bonds  with the skin to continue killing germs even after washing.    Oral Hygiene is also important to reduce your risk of infection.  Remember - BRUSH YOUR TEETH THE MORNING OF SURGERY WITH YOUR REGULAR TOOTHPASTE  Please do not use if you have an allergy to CHG or antibacterial soaps. If your skin becomes reddened/irritated stop using the CHG.  Do not shave (including legs and underarms) for at least 48 hours prior to first CHG shower. It is OK to shave your face.  Please follow these instructions carefully.   Shower the NIGHT BEFORE SURGERY and the MORNING OF SURGERY with DIAL Soap.   Pat yourself dry with a CLEAN TOWEL.  Wear CLEAN PAJAMAS to bed the night before surgery  Place CLEAN SHEETS on your bed the night of your first shower and DO NOT SLEEP WITH  PETS.   Day of Surgery: Please shower morning of surgery  Wear Clean/Comfortable clothing the morning of surgery Do not apply any deodorants/lotions.   Remember to brush your teeth WITH YOUR REGULAR TOOTHPASTE.   Questions were answered. Patient verbalized understanding of instructions.

## 2023-05-24 NOTE — Progress Notes (Signed)
Anesthesia Chart Review: Same day workup  62 yo female follows with cardiology for hx of Chronic noncardiac chest pain, NICM with recovered EF, HTN. Normal cath 2018. Pt reports she uses NTG ~ 3x per week and per notes cardiology is aware of this. When she was seen by Jari Favre, PA-C on 03/04/23 she reported exertional chest discomfort. Prior CT in 10/2022 showed no significant CAD. Her imdur was increased to 90mg  daily. Stress test was ordered for surgical clearance. Stress test 03/31/23 was low risk, no change from prior study in 2022. Subsequently cleared per telephone encounter 04/14/23, "Chart reviewed as part of pre-operative protocol coverage. Patient was contacted 04/14/2023 in reference to pre-operative risk assessment for pending surgery as outlined below.  Kelly Butler was last seen on 03/04/2023 by Jari Favre, PA.  She reported new chest pain, Imdur was increased, Lexiscan Myoview was negative for ischemia. Since that day, Kelly Butler has been stable from a cardiac standpoint.  She denies any new symptoms or concerns. Therefore, based on ACC/AHA guidelines, the patient would be at acceptable risk for the planned procedure without further cardiovascular testing."   Follows with pulmonology for hx of OHS/restriction, nocturnal hypoxemia on 2L O2 qhs. Severe persistent asthma, maintained on Fasenra.   Pt will need DOS labs and eval.   EKG 03/04/23: NSR. Rate 78.  Nuclear stress 03/31/23:   Findings are consistent with no ischemia. The study is low risk.   No ST deviation was noted.   LV perfusion is normal.   Left ventricular function is normal. Nuclear stress EF: 59%. The left ventricular ejection fraction is normal (55-65%). End diastolic cavity size is normal. End systolic cavity size is normal.   Prior study available for comparison from 09/08/2020. No changes compared to prior study. Normal perfusion. LVEF 52% with normal wall motion, low risk study   No reversible ischemia. Apical thinning  artifact, worse at rest than stress. LVEF 59% with normal wall motion. This is a low risk study. No change compared to prior study in 2022.  Coronary CTA 10/28/22: IMPRESSION: 1. Calcium score 0   2.  Normal right dominant coronary arteries   3.  Normal ascending thoracic aorta 3.5 cm  TTE 07/03/23:  1. Left ventricular ejection fraction, by estimation, is 60 to 65%. The  left ventricle has normal function. The left ventricle has no regional  wall motion abnormalities. There is mild concentric left ventricular  hypertrophy. Left ventricular diastolic  parameters are consistent with Grade I diastolic dysfunction (impaired  relaxation).   2. Right ventricular systolic function is normal. The right ventricular  size is normal. There is normal pulmonary artery systolic pressure.   3. The mitral valve is grossly normal. Trivial mitral valve  regurgitation. No evidence of mitral stenosis.   4. The aortic valve is tricuspid. There is mild thickening of the aortic  valve. Aortic valve regurgitation is mild. Aortic valve sclerosis is  present, with no evidence of aortic valve stenosis.   5. The inferior vena cava is normal in size with greater than 50%  respiratory variability, suggesting right atrial pressure of 3 mmHg.   Comparison(s): Compared to prior TTE in 02/2021, the LVEF has improved  from 40-45% to 60-65%.   Cath 06/17/2017: There is mild to moderate left ventricular systolic dysfunction. LV end diastolic pressure is moderately elevated. The left ventricular ejection fraction is 35-45% by visual estimate. Hemodynamic findings consistent with moderate pulmonary hypertension.   1. Normal coronary anatomy 2. Mild to moderate  LV dysfunction. EF estimated at 40-45% 3. Moderate pulmonary HTN 4. Moderately elevated LV filling pressures. 5. Reduced cardiac output.   Plan: medical management of CHF. May need more intensive diuretic therapy.    Zannie Cove [facility]/Anesthesiology Phone [phone] 05/25/2023 2:13 PM

## 2023-05-25 NOTE — Anesthesia Preprocedure Evaluation (Addendum)
Anesthesia Evaluation  Patient identified by MRN, date of birth, ID band Patient awake    Reviewed: Allergy & Precautions, NPO status , Patient's Chart, lab work & pertinent test results  History of Anesthesia Complications Negative for: history of anesthetic complications  Airway Mallampati: III  TM Distance: >3 FB Neck ROM: Limited    Dental  (+) Dental Advisory Given   Pulmonary asthma , sleep apnea , former smoker  Nocturnal O2 use    Pulmonary exam normal        Cardiovascular hypertension, Pt. on medications Normal cardiovascular exam   '23 TTE - EF 60 to 65%. There is mild concentric left ventricular hypertrophy. Grade I diastolic dysfunction (impaired relaxation). Trivial mitral valve regurgitation. Aortic valve regurgitation is mild.     Neuro/Psych  Headaches PSYCHIATRIC DISORDERS Anxiety        GI/Hepatic Neg liver ROS,GERD  Medicated and Controlled,,  Endo/Other   Obesity   Renal/GU negative Renal ROS     Musculoskeletal  (+)  Fibromyalgia -  Abdominal   Peds  Hematology negative hematology ROS (+)   Anesthesia Other Findings   Reproductive/Obstetrics                             Anesthesia Physical Anesthesia Plan  ASA: 3  Anesthesia Plan: General   Post-op Pain Management: Tylenol PO (pre-op)*   Induction: Intravenous  PONV Risk Score and Plan: 3 and Treatment may vary due to age or medical condition, Ondansetron, Dexamethasone and Midazolam  Airway Management Planned: LMA  Additional Equipment: None  Intra-op Plan:   Post-operative Plan: Extubation in OR  Informed Consent: I have reviewed the patients History and Physical, chart, labs and discussed the procedure including the risks, benefits and alternatives for the proposed anesthesia with the patient or authorized representative who has indicated his/her understanding and acceptance.     Dental  advisory given  Plan Discussed with: CRNA and Anesthesiologist  Anesthesia Plan Comments: (PAT note by Antionette Poles, PA-C: 62 yo female follows with cardiology for hx of Chronic noncardiac chest pain, NICM with recovered EF, HTN. Normal cath 2018. Pt reports she uses NTG ~ 3x per week and per notes cardiology is aware of this. When she was seen by Jari Favre, PA-C on 03/04/23 she reported exertional chest discomfort. Prior CT in 10/2022 showed no significant CAD. Her imdur was increased to 90mg  daily. Stress test was ordered for surgical clearance. Stress test 03/31/23 was low risk, no change from prior study in 2022. Subsequently cleared per telephone encounter 04/14/23, "Chart reviewed as part of pre-operative protocol coverage. Patient was contacted 04/14/2023 in reference to pre-operative risk assessment for pending surgery as outlined below.  Idola Ganim was last seen on 03/04/2023 by Jari Favre, PA.  She reported new chest pain, Imdur was increased, Lexiscan Myoview was negative for ischemia. Since that day, Zoe Roache has been stable from a cardiac standpoint.  She denies any new symptoms or concerns. Therefore, based on ACC/AHA guidelines, the patient would be at acceptable risk for the planned procedure without further cardiovascular testing."  Follows with pulmonology for hx of OHS/restriction, nocturnal hypoxemia on 2L O2 qhs. Severe persistent asthma, maintained on Fasenra.   Pt will need DOS labs and eval.   EKG 03/04/23: NSR. Rate 78.  Nuclear stress 03/31/23:   Findings are consistent with no ischemia. The study is low risk.   No ST deviation was noted.   LV perfusion  is normal.   Left ventricular function is normal. Nuclear stress EF: 59%. The left ventricular ejection fraction is normal (55-65%). End diastolic cavity size is normal. End systolic cavity size is normal.   Prior study available for comparison from 09/08/2020. No changes compared to prior study. Normal perfusion. LVEF  52% with normal wall motion, low risk study  No reversible ischemia. Apical thinning artifact, worse at rest than stress. LVEF 59% with normal wall motion. This is a low risk study. No change compared to prior study in 2022.  Coronary CTA 10/28/22: IMPRESSION: 1. Calcium score 0  2.  Normal right dominant coronary arteries  3.  Normal ascending thoracic aorta 3.5 cm  TTE 07/03/23: 1. Left ventricular ejection fraction, by estimation, is 60 to 65%. The  left ventricle has normal function. The left ventricle has no regional  wall motion abnormalities. There is mild concentric left ventricular  hypertrophy. Left ventricular diastolic  parameters are consistent with Grade I diastolic dysfunction (impaired  relaxation).  2. Right ventricular systolic function is normal. The right ventricular  size is normal. There is normal pulmonary artery systolic pressure.  3. The mitral valve is grossly normal. Trivial mitral valve  regurgitation. No evidence of mitral stenosis.  4. The aortic valve is tricuspid. There is mild thickening of the aortic  valve. Aortic valve regurgitation is mild. Aortic valve sclerosis is  present, with no evidence of aortic valve stenosis.  5. The inferior vena cava is normal in size with greater than 50%  respiratory variability, suggesting right atrial pressure of 3 mmHg.   Comparison(s): Compared to prior TTE in 02/2021, the LVEF has improved  from 40-45% to 60-65%.   Cath 06/17/2017:  There is mild to moderate left ventricular systolic dysfunction.  LV end diastolic pressure is moderately elevated.  The left ventricular ejection fraction is 35-45% by visual estimate.  Hemodynamic findings consistent with moderate pulmonary hypertension.   1. Normal coronary anatomy 2. Mild to moderate LV dysfunction. EF estimated at 40-45% 3. Moderate pulmonary HTN 4. Moderately elevated LV filling pressures. 5. Reduced cardiac output.  Plan: medical  management of CHF. May need more intensive diuretic therapy.    )        Anesthesia Quick Evaluation

## 2023-05-26 ENCOUNTER — Ambulatory Visit (HOSPITAL_BASED_OUTPATIENT_CLINIC_OR_DEPARTMENT_OTHER): Payer: Medicaid Other | Admitting: Physician Assistant

## 2023-05-26 ENCOUNTER — Other Ambulatory Visit: Payer: Self-pay

## 2023-05-26 ENCOUNTER — Ambulatory Visit (HOSPITAL_COMMUNITY): Payer: Self-pay | Admitting: Physician Assistant

## 2023-05-26 ENCOUNTER — Encounter (HOSPITAL_COMMUNITY)
Admission: RE | Disposition: A | Discharge status: Home / Self Care | Payer: Self-pay | Source: Ambulatory Visit | Attending: Orthopedic Surgery

## 2023-05-26 ENCOUNTER — Ambulatory Visit (HOSPITAL_COMMUNITY)
Admission: RE | Admit: 2023-05-26 | Discharge: 2023-05-26 | Disposition: A | Discharge status: Home / Self Care | Payer: Medicaid Other | Source: Ambulatory Visit | Attending: Orthopedic Surgery | Admitting: Orthopedic Surgery

## 2023-05-26 ENCOUNTER — Encounter (HOSPITAL_COMMUNITY): Payer: Self-pay | Admitting: Orthopedic Surgery

## 2023-05-26 ENCOUNTER — Other Ambulatory Visit (HOSPITAL_COMMUNITY): Payer: Self-pay

## 2023-05-26 DIAGNOSIS — I509 Heart failure, unspecified: Secondary | ICD-10-CM | POA: Diagnosis not present

## 2023-05-26 DIAGNOSIS — I110 Hypertensive heart disease with heart failure: Secondary | ICD-10-CM | POA: Insufficient documentation

## 2023-05-26 DIAGNOSIS — J4550 Severe persistent asthma, uncomplicated: Secondary | ICD-10-CM | POA: Diagnosis not present

## 2023-05-26 DIAGNOSIS — E669 Obesity, unspecified: Secondary | ICD-10-CM | POA: Insufficient documentation

## 2023-05-26 DIAGNOSIS — G4730 Sleep apnea, unspecified: Secondary | ICD-10-CM | POA: Diagnosis not present

## 2023-05-26 DIAGNOSIS — M797 Fibromyalgia: Secondary | ICD-10-CM | POA: Insufficient documentation

## 2023-05-26 DIAGNOSIS — G5601 Carpal tunnel syndrome, right upper limb: Secondary | ICD-10-CM | POA: Diagnosis present

## 2023-05-26 DIAGNOSIS — Z9981 Dependence on supplemental oxygen: Secondary | ICD-10-CM | POA: Diagnosis not present

## 2023-05-26 DIAGNOSIS — K219 Gastro-esophageal reflux disease without esophagitis: Secondary | ICD-10-CM | POA: Diagnosis not present

## 2023-05-26 DIAGNOSIS — R0902 Hypoxemia: Secondary | ICD-10-CM | POA: Insufficient documentation

## 2023-05-26 DIAGNOSIS — Z79899 Other long term (current) drug therapy: Secondary | ICD-10-CM | POA: Diagnosis not present

## 2023-05-26 DIAGNOSIS — Z87891 Personal history of nicotine dependence: Secondary | ICD-10-CM | POA: Diagnosis not present

## 2023-05-26 DIAGNOSIS — R0789 Other chest pain: Secondary | ICD-10-CM | POA: Diagnosis not present

## 2023-05-26 DIAGNOSIS — Z6832 Body mass index (BMI) 32.0-32.9, adult: Secondary | ICD-10-CM | POA: Diagnosis not present

## 2023-05-26 DIAGNOSIS — I428 Other cardiomyopathies: Secondary | ICD-10-CM | POA: Diagnosis not present

## 2023-05-26 DIAGNOSIS — I10 Essential (primary) hypertension: Secondary | ICD-10-CM

## 2023-05-26 HISTORY — DX: Anxiety disorder, unspecified: F41.9

## 2023-05-26 HISTORY — PX: CARPAL TUNNEL RELEASE: SHX101

## 2023-05-26 HISTORY — DX: Cardiac murmur, unspecified: R01.1

## 2023-05-26 LAB — POCT I-STAT, CHEM 8
BUN: 12 mg/dL (ref 6–20)
Calcium, Ion: 1.18 mmol/L (ref 1.15–1.40)
Chloride: 110 mmol/L (ref 98–111)
Creatinine, Ser: 0.6 mg/dL (ref 0.44–1.00)
Glucose, Bld: 157 mg/dL — ABNORMAL HIGH (ref 70–99)
HCT: 39 % (ref 36.0–46.0)
Hemoglobin: 13.3 g/dL (ref 12.0–15.0)
Potassium: 3.5 mmol/L (ref 3.5–5.1)
Sodium: 141 mmol/L (ref 135–145)
TCO2: 22 mmol/L (ref 22–32)

## 2023-05-26 SURGERY — CARPAL TUNNEL RELEASE
Anesthesia: General | Laterality: Right

## 2023-05-26 MED ORDER — BUPIVACAINE HCL (PF) 0.25 % IJ SOLN
INTRAMUSCULAR | Status: DC | PRN
  Administered 2023-05-26: 9 mL

## 2023-05-26 MED ORDER — CHLORHEXIDINE GLUCONATE 0.12 % MT SOLN
15.0000 mL | Freq: Once | OROMUCOSAL | Status: AC
Start: 2023-05-26 — End: 2023-05-26

## 2023-05-26 MED ORDER — FENTANYL CITRATE (PF) 250 MCG/5ML IJ SOLN
INTRAMUSCULAR | Status: AC
  Filled 2023-05-26: qty 5

## 2023-05-26 MED ORDER — HYDROCODONE-ACETAMINOPHEN 5-325 MG PO TABS
1.0000 | ORAL_TABLET | Freq: Four times a day (QID) | ORAL | 0 refills | Status: DC | PRN
  Filled 2023-05-26: qty 20, 3d supply, fill #0

## 2023-05-26 MED ORDER — PROPOFOL 10 MG/ML IV BOLUS
INTRAVENOUS | Status: AC
  Filled 2023-05-26: qty 20

## 2023-05-26 MED ORDER — MIDAZOLAM HCL 2 MG/2ML IJ SOLN
INTRAMUSCULAR | Status: DC | PRN
  Administered 2023-05-26: 2 mg via INTRAVENOUS

## 2023-05-26 MED ORDER — CEFAZOLIN SODIUM-DEXTROSE 2-4 GM/100ML-% IV SOLN
INTRAVENOUS | Status: AC
  Filled 2023-05-26: qty 100

## 2023-05-26 MED ORDER — FENTANYL CITRATE (PF) 100 MCG/2ML IJ SOLN: 25.0000 ug | INTRAMUSCULAR | Status: DC | PRN

## 2023-05-26 MED ORDER — ALBUTEROL SULFATE (2.5 MG/3ML) 0.083% IN NEBU
INHALATION_SOLUTION | RESPIRATORY_TRACT | Status: AC
  Filled 2023-05-26: qty 3

## 2023-05-26 MED ORDER — FENTANYL CITRATE (PF) 250 MCG/5ML IJ SOLN
INTRAMUSCULAR | Status: DC | PRN
  Administered 2023-05-26 (×2): 100 ug via INTRAVENOUS
  Administered 2023-05-26: 50 ug via INTRAVENOUS

## 2023-05-26 MED ORDER — CHLORHEXIDINE GLUCONATE 0.12 % MT SOLN
OROMUCOSAL | Status: AC
  Administered 2023-05-26: 15 mL via OROMUCOSAL
  Filled 2023-05-26: qty 15

## 2023-05-26 MED ORDER — ONDANSETRON HCL 4 MG/2ML IJ SOLN
INTRAMUSCULAR | Status: DC | PRN
  Administered 2023-05-26: 4 mg via INTRAVENOUS

## 2023-05-26 MED ORDER — ALBUTEROL SULFATE HFA 108 (90 BASE) MCG/ACT IN AERS
INHALATION_SPRAY | RESPIRATORY_TRACT | Status: DC | PRN
  Administered 2023-05-26: 2 via RESPIRATORY_TRACT

## 2023-05-26 MED ORDER — EPHEDRINE SULFATE-NACL 50-0.9 MG/10ML-% IV SOSY
PREFILLED_SYRINGE | INTRAVENOUS | Status: DC | PRN
  Administered 2023-05-26: 10 mg via INTRAVENOUS

## 2023-05-26 MED ORDER — ACETAMINOPHEN 500 MG PO TABS: 1000.0000 mg | ORAL_TABLET | Freq: Once | ORAL | Status: AC

## 2023-05-26 MED ORDER — ONDANSETRON HCL 4 MG/2ML IJ SOLN: 4.0000 mg | Freq: Once | INTRAMUSCULAR | Status: DC | PRN

## 2023-05-26 MED ORDER — LACTATED RINGERS IV SOLN: INTRAVENOUS | Status: DC

## 2023-05-26 MED ORDER — 0.9 % SODIUM CHLORIDE (POUR BTL) OPTIME
TOPICAL | Status: DC | PRN
Start: 2023-05-26 — End: 2023-05-26
  Administered 2023-05-26: 1000 mL

## 2023-05-26 MED ORDER — PROPOFOL 10 MG/ML IV BOLUS
INTRAVENOUS | Status: DC | PRN
  Administered 2023-05-26: 150 mg via INTRAVENOUS

## 2023-05-26 MED ORDER — OXYCODONE HCL 5 MG PO TABS: 5.0000 mg | ORAL_TABLET | Freq: Once | ORAL | Status: DC | PRN

## 2023-05-26 MED ORDER — ACETAMINOPHEN 500 MG PO TABS
ORAL_TABLET | ORAL | Status: AC
  Administered 2023-05-26: 1000 mg via ORAL
  Filled 2023-05-26: qty 2

## 2023-05-26 MED ORDER — LIDOCAINE 2% (20 MG/ML) 5 ML SYRINGE
INTRAMUSCULAR | Status: DC | PRN
  Administered 2023-05-26: 25 mg via INTRAVENOUS

## 2023-05-26 MED ORDER — ORAL CARE MOUTH RINSE: 15.0000 mL | Freq: Once | OROMUCOSAL | Status: AC

## 2023-05-26 MED ORDER — OXYCODONE HCL 5 MG/5ML PO SOLN: 5.0000 mg | Freq: Once | ORAL | Status: DC | PRN

## 2023-05-26 MED ORDER — CEFAZOLIN SODIUM-DEXTROSE 2-4 GM/100ML-% IV SOLN
2.0000 g | INTRAVENOUS | Status: AC
  Administered 2023-05-26: 2 g via INTRAVENOUS

## 2023-05-26 MED ORDER — BUPIVACAINE HCL (PF) 0.25 % IJ SOLN
INTRAMUSCULAR | Status: AC
  Filled 2023-05-26: qty 30

## 2023-05-26 MED ORDER — METOPROLOL TARTRATE 50 MG PO TABS
50.0000 mg | ORAL_TABLET | Freq: Once | ORAL | Status: AC
  Administered 2023-05-26: 50 mg via ORAL
  Filled 2023-05-26: qty 1

## 2023-05-26 MED ORDER — MIDAZOLAM HCL 2 MG/2ML IJ SOLN
INTRAMUSCULAR | Status: AC
  Filled 2023-05-26: qty 2

## 2023-05-26 MED ORDER — DEXAMETHASONE SODIUM PHOSPHATE 10 MG/ML IJ SOLN
INTRAMUSCULAR | Status: DC | PRN
  Administered 2023-05-26: 10 mg via INTRAVENOUS

## 2023-05-26 SURGICAL SUPPLY — 36 items
BAG COUNTER SPONGE SURGICOUNT (BAG) ×1 IMPLANT
BNDG ELASTIC 3INX 5YD STR LF (GAUZE/BANDAGES/DRESSINGS) ×1 IMPLANT
BNDG ELASTIC 3X5.8 VLCR NS LF (GAUZE/BANDAGES/DRESSINGS) IMPLANT
BNDG ELASTIC 4X5.8 VLCR STR LF (GAUZE/BANDAGES/DRESSINGS) ×1 IMPLANT
BNDG ESMARK 4X9 LF (GAUZE/BANDAGES/DRESSINGS) ×1 IMPLANT
BNDG GAUZE DERMACEA FLUFF 4 (GAUZE/BANDAGES/DRESSINGS) ×1 IMPLANT
CHLORAPREP W/TINT 26 (MISCELLANEOUS) ×1 IMPLANT
CORD BIPOLAR FORCEPS 12FT (ELECTRODE) ×1 IMPLANT
COVER SURGICAL LIGHT HANDLE (MISCELLANEOUS) ×1 IMPLANT
CUFF TOURN SGL QUICK 18X4 (TOURNIQUET CUFF) ×1 IMPLANT
CUFF TRNQT CYL 24X4X16.5-23 (TOURNIQUET CUFF) IMPLANT
DRAPE SURG 17X23 STRL (DRAPES) ×1 IMPLANT
GAUZE PAD ABD 8X10 STRL (GAUZE/BANDAGES/DRESSINGS) ×2 IMPLANT
GAUZE SPONGE 4X4 12PLY STRL (GAUZE/BANDAGES/DRESSINGS) ×1 IMPLANT
GAUZE XEROFORM 1X8 LF (GAUZE/BANDAGES/DRESSINGS) ×1 IMPLANT
GLOVE BIO SURGEON STRL SZ7.5 (GLOVE) ×2 IMPLANT
GLOVE BIOGEL PI IND STRL 8 (GLOVE) ×1 IMPLANT
GOWN STRL REUS W/ TWL LRG LVL3 (GOWN DISPOSABLE) ×2 IMPLANT
KIT BASIN OR (CUSTOM PROCEDURE TRAY) ×1 IMPLANT
KIT TURNOVER KIT B (KITS) ×1 IMPLANT
NDL HYPO 25GX1X1/2 BEV (NEEDLE) IMPLANT
NEEDLE HYPO 25GX1X1/2 BEV (NEEDLE) ×1 IMPLANT
NS IRRIG 1000ML POUR BTL (IV SOLUTION) ×1 IMPLANT
PACK ORTHO EXTREMITY (CUSTOM PROCEDURE TRAY) ×1 IMPLANT
PAD ARMBOARD 7.5X6 YLW CONV (MISCELLANEOUS) ×2 IMPLANT
SPONGE T-LAP 4X18 ~~LOC~~+RFID (SPONGE) ×1 IMPLANT
SUCTION TUBE FRAZIER 10FR DISP (SUCTIONS) IMPLANT
SUT ETHILON 3 0 PS 1 (SUTURE) ×1 IMPLANT
SUT ETHILON 4 0 PS 2 18 (SUTURE) IMPLANT
SUT VIC AB 3-0 SH 18 (SUTURE) ×1 IMPLANT
SYR CONTROL 10ML LL (SYRINGE) IMPLANT
TOWEL GREEN STERILE (TOWEL DISPOSABLE) ×1 IMPLANT
TOWEL GREEN STERILE FF (TOWEL DISPOSABLE) ×1 IMPLANT
TUBE CONNECTING 12X1/4 (SUCTIONS) IMPLANT
UNDERPAD 30X36 HEAVY ABSORB (UNDERPADS AND DIAPERS) ×1 IMPLANT
WATER STERILE IRR 1000ML POUR (IV SOLUTION) ×1 IMPLANT

## 2023-05-26 NOTE — Transfer of Care (Signed)
Immediate Anesthesia Transfer of Care Note  Patient: Kelly Butler  Procedure(s) Performed: RIGHT CARPAL TUNNEL RELEASE (Right)  Patient Location: PACU  Anesthesia Type:General  Level of Consciousness: awake, alert , and oriented  Airway & Oxygen Therapy: Patient Spontanous Breathing and Patient connected to nasal cannula oxygen  Post-op Assessment: Report given to RN and Post -op Vital signs reviewed and stable  Post vital signs: Reviewed and stable  Last Vitals:  Vitals Value Taken Time  BP 170/124 05/26/23 0901  Temp 36.2 C 05/26/23 0855  Pulse 81 05/26/23 0902  Resp 17 05/26/23 0902  SpO2 100 % 05/26/23 0902  Vitals shown include unfiled device data.  Last Pain:  Vitals:   05/26/23 0658  TempSrc:   PainSc: 0-No pain         Complications: There were no known notable events for this encounter.

## 2023-05-26 NOTE — Discharge Instructions (Signed)

## 2023-05-26 NOTE — Anesthesia Postprocedure Evaluation (Signed)
Anesthesia Post Note  Patient: Kelly Butler  Procedure(s) Performed: RIGHT CARPAL TUNNEL RELEASE (Right)     Patient location during evaluation: PACU Anesthesia Type: General Level of consciousness: awake and alert Pain management: pain level controlled Vital Signs Assessment: post-procedure vital signs reviewed and stable Respiratory status: spontaneous breathing, nonlabored ventilation and respiratory function stable Cardiovascular status: stable and blood pressure returned to baseline Anesthetic complications: no   There were no known notable events for this encounter.  Last Vitals:  Vitals:   05/26/23 0900 05/26/23 0915  BP: (!) 143/81 137/77  Pulse: 86 83  Resp: (!) 25 13  Temp:    SpO2: 91% 92%    Last Pain:  Vitals:   05/26/23 0915  TempSrc:   PainSc: 0-No pain                 [name]
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
## 2023-05-26 NOTE — Anesthesia Procedure Notes (Signed)
Procedure Name: LMA Insertion Date/Time: 05/26/2023 8:07 AM  Performed by: Hali Marry, CRNAPre-anesthesia Checklist: Patient identified, Emergency Drugs available, Suction available and Patient being monitored Patient Re-evaluated:Patient Re-evaluated prior to induction Oxygen Delivery Method: Circle system utilized Preoxygenation: Pre-oxygenation with 100% oxygen Induction Type: IV induction Ventilation: Mask ventilation without difficulty LMA: LMA inserted LMA Size: 4.0 Tube type: Oral Number of attempts: 1 Airway Equipment and Method: Stylet and Oral airway Placement Confirmation: ETT inserted through vocal cords under direct vision, positive ETCO2 and breath sounds checked- equal and bilateral Tube secured with: Tape Dental Injury: Teeth and Oropharynx as per pre-operative assessment

## 2023-05-26 NOTE — Op Note (Addendum)
05/26/2023 [facility]                              OPERATIVE REPORT   PREOPERATIVE DIAGNOSIS:  Right carpal tunnel syndrome.  POSTOPERATIVE DIAGNOSIS:  Right carpal tunnel syndrome.  PROCEDURE:  Right carpal tunnel release.  SURGEON:  Betha Loa, MD  ASSISTANT:  none.  ANESTHESIA: General  IV FLUIDS:  Per anesthesia flow sheet.  ESTIMATED BLOOD LOSS:  Minimal.  COMPLICATIONS:  None.  SPECIMENS:  None.  TOURNIQUET TIME:    Total Tourniquet Time Documented: Forearm (Right) - 28 minutes Total: Forearm (Right) - 28 minutes   DISPOSITION:  Stable to PACU.  LOCATION: [facility] OR  INDICATIONS:  62 y.o. yo female with numbness and tingling right hand.  Nocturnal symptoms. Positive nerve conduction studies. She wishes to proceed with right carpal tunnel release.  Risks, benefits and alternatives of surgery were discussed including the risk of blood loss; infection; damage to nerves, vessels, tendons, ligaments, bone; failure of surgery; need for additional surgery; complications with wound healing; continued pain; recurrence of carpal tunnel syndrome; and damage to motor branch. She voiced understanding of these risks and elected to proceed.   OPERATIVE COURSE:  After being identified preoperatively by myself, the patient and I agreed upon the procedure and site of procedure.  The surgical site was marked.  Surgical consent had been signed.  She was given IV Ancef as preoperative antibiotic prophylaxis.  She was transferred to the operating room and placed on the operating room table in supine position with the Right upper extremity on an armboard.  General anesthesia was induced by the anesthesiologist.  Right upper extremity was prepped and draped in normal sterile orthopaedic fashion.  A surgical pause was performed between the surgeons, anesthesia, and operating room staff, and all were in agreement as to the patient, procedure, and site of procedure.  Tourniquet at the proximal aspect of the  forearm was inflated to 250 mmHg after exsanguination of the arm with an Esmarch bandage  Incision was made over the transverse carpal ligament and carried into the subcutaneous tissues by spreading technique.  Bipolar electrocautery was used to obtain hemostasis.  The palmar fascia was sharply incised.  The transverse carpal ligament was identified.  The fascia distal to the ligament was opened.  Retractor was placed and the flexor tendons were identified.  The flexor tendon to the ring finger was identified and retracted radially.  The transverse carpal ligament was then incised from distal to proximal under direct visualization.  There was very little volar antebrachial fascia.  A finger was placed into the wound to ensure complete decompression, which was the case.  The nerve was examined.  There was scarring surrounding the nerve and it was adherent to the radial leaflet of the ligament.  The scarring was tethering the nerve.  It was freed up from the nerve while protecting the nerve itself.  The incision was extended into the distal forearm to aid in visualization of the nerve and removal of the surrounding scar.    The motor branch was identified and was intact.  The common digital branches were visualized and were intact.  The wound was copiously irrigated with sterile saline.  It was then closed with 4-0 nylon in a horizontal mattress fashion.  It was injected with 0.25% plain Marcaine to aid in postoperative analgesia.  It was dressed with sterile Xeroform, 4x4s, an ABD, and wrapped with Kerlix  and an Ace bandage.  Tourniquet was deflated at 28 minutes.  Fingertips were pink with brisk capillary refill after deflation of the tourniquet.  Operative drapes were broken down.  The patient was awoken from anesthesia safely.  She was transferred back to stretcher and taken to the PACU in stable condition.  I will see her back in the office in 1 week for postoperative followup.  I will give her a prescription for  Norco 5/325 1-2 tabs PO q6 hours prn pain, dispense # 20.    Betha Loa, MD Electronically signed, 05/26/23

## 2023-05-27 ENCOUNTER — Encounter (HOSPITAL_COMMUNITY): Payer: Self-pay | Admitting: Orthopedic Surgery

## 2023-06-16 ENCOUNTER — Other Ambulatory Visit (HOSPITAL_COMMUNITY): Payer: Self-pay

## 2023-06-16 ENCOUNTER — Other Ambulatory Visit (HOSPITAL_COMMUNITY): Payer: Self-pay | Admitting: Pharmacy Technician

## 2023-06-16 NOTE — Progress Notes (Signed)
Specialty Pharmacy Refill Coordination Note  Kelly Butler is a 62 y.o. female assessed today regarding refills of clinic administered specialty medication(s) Benralizumab [facility])   Clinic requested Courier to Provider Office  Set Refill up with Kelly Batten C.   Delivery date: 06/22/23   Verified address: Kelly Butler [address] [place], [place]   Medication will be filled on 06/21/23.

## 2023-06-21 ENCOUNTER — Other Ambulatory Visit: Payer: Self-pay

## 2023-06-24 NOTE — Telephone Encounter (Signed)
Surgery already performed  Closing encounter

## 2023-06-27 ENCOUNTER — Ambulatory Visit: Payer: Medicaid Other

## 2023-06-27 ENCOUNTER — Telehealth: Payer: Medicaid Other

## 2023-06-27 MED ORDER — BENRALIZUMAB 30 MG/ML ~~LOC~~ SOSY
30.0000 mg | PREFILLED_SYRINGE | Freq: Once | SUBCUTANEOUS | Status: DC
  Filled 2023-06-27: qty 1

## 2023-07-01 ENCOUNTER — Ambulatory Visit: Payer: Medicaid Other | Admitting: Internal Medicine

## 2023-07-05 MED ORDER — BENRALIZUMAB 30 MG/ML ~~LOC~~ SOSY
30.0000 mg | PREFILLED_SYRINGE | Freq: Once | SUBCUTANEOUS | Status: DC
  Filled 2023-07-05: qty 1

## 2023-07-13 ENCOUNTER — Ambulatory Visit: Payer: Medicaid Other

## 2023-07-16 ENCOUNTER — Other Ambulatory Visit: Payer: Self-pay | Admitting: Cardiovascular Disease

## 2023-07-16 ENCOUNTER — Other Ambulatory Visit: Payer: Self-pay | Admitting: Physician Assistant

## 2023-07-16 ENCOUNTER — Other Ambulatory Visit: Payer: Self-pay | Admitting: Internal Medicine

## 2023-07-20 ENCOUNTER — Other Ambulatory Visit: Payer: Self-pay | Admitting: Obstetrics

## 2023-07-21 ENCOUNTER — Ambulatory Visit: Payer: Medicaid Other

## 2023-07-21 VITALS — BP 137/79 | HR 89 | Temp 98.3°F | Resp 16 | Ht 60.0 in | Wt 186.6 lb

## 2023-07-21 DIAGNOSIS — J4550 Severe persistent asthma, uncomplicated: Secondary | ICD-10-CM

## 2023-07-21 MED ORDER — BENRALIZUMAB 30 MG/ML ~~LOC~~ SOSY
30.0000 mg | PREFILLED_SYRINGE | Freq: Once | SUBCUTANEOUS | Status: AC
Start: 2023-07-21 — End: 2023-07-21
  Administered 2023-07-21: 30 mg via SUBCUTANEOUS
  Filled 2023-07-21: qty 1

## 2023-07-21 NOTE — Progress Notes (Signed)
Diagnosis: Asthma  Provider:  Chilton Greathouse MD  Procedure: Injection  Fasenra (Benralizumab), Dose: 30 mg, Site: subcutaneous, Number of injections: 1  Injection Site(s): Left arm  Post Care: Patient declined observation  Discharge: Condition: Good, Destination: Home . AVS Provided  Performed by:  Marlow Baars [place], RN

## 2023-08-04 ENCOUNTER — Other Ambulatory Visit (HOSPITAL_COMMUNITY): Payer: Self-pay

## 2023-08-04 ENCOUNTER — Other Ambulatory Visit: Payer: Self-pay

## 2023-08-08 ENCOUNTER — Encounter (HOSPITAL_COMMUNITY): Payer: Self-pay | Admitting: Orthopedic Surgery

## 2023-08-08 ENCOUNTER — Other Ambulatory Visit: Payer: Self-pay

## 2023-08-08 NOTE — Progress Notes (Signed)
Anesthesia Chart Review: Same day workup  63 yo female follows with cardiology for hx of Chronic noncardiac chest pain, NICM with recovered EF, HTN. Normal cath 2018. Pt reports she uses NTG ~ 3x per week and per notes cardiology is aware of this. When she was seen by Jari Favre, PA-C on 03/04/23 she reported exertional chest discomfort. Prior CT in 10/2022 showed no significant CAD. Her imdur was increased to 90mg  daily. Stress test was ordered for surgical clearance. Stress test 03/31/23 was low risk, no change from prior study in 2022. Subsequently cleared per telephone encounter 04/14/23, "Chart reviewed as part of pre-operative protocol coverage. Patient was contacted 04/14/2023 in reference to pre-operative risk assessment for pending surgery as outlined below.  Genell Thede was last seen on 03/04/2023 by Jari Favre, PA.  She reported new chest pain, Imdur was increased, Lexiscan Myoview was negative for ischemia. Since that day, Prue Lingenfelter has been stable from a cardiac standpoint.  She denies any new symptoms or concerns. Therefore, based on ACC/AHA guidelines, the patient would be at acceptable risk for the planned procedure without further cardiovascular testing."   Follows with pulmonology for hx of OHS/restriction, nocturnal hypoxemia on 2L O2 qhs. Severe persistent asthma, maintained on Fasenra.   GERD, maintained omeprazole.  Underwent R carpal tunnel release 05/26/23 without complication.    Pt will need DOS labs and eval.    EKG 03/04/23: NSR. Rate 78.   Nuclear stress 03/31/23:   Findings are consistent with no ischemia. The study is low risk.   No ST deviation was noted.   LV perfusion is normal.   Left ventricular function is normal. Nuclear stress EF: 59%. The left ventricular ejection fraction is normal (55-65%). End diastolic cavity size is normal. End systolic cavity size is normal.   Prior study available for comparison from 09/08/2020. No changes compared to prior study. Normal  perfusion. LVEF 52% with normal wall motion, low risk study   No reversible ischemia. Apical thinning artifact, worse at rest than stress. LVEF 59% with normal wall motion. This is a low risk study. No change compared to prior study in 2022.   Coronary CTA 10/28/22: IMPRESSION: 1. Calcium score 0   2.  Normal right dominant coronary arteries   3.  Normal ascending thoracic aorta 3.5 cm   TTE 07/03/23:  1. Left ventricular ejection fraction, by estimation, is 60 to 65%. The  left ventricle has normal function. The left ventricle has no regional  wall motion abnormalities. There is mild concentric left ventricular  hypertrophy. Left ventricular diastolic  parameters are consistent with Grade I diastolic dysfunction (impaired  relaxation).   2. Right ventricular systolic function is normal. The right ventricular  size is normal. There is normal pulmonary artery systolic pressure.   3. The mitral valve is grossly normal. Trivial mitral valve  regurgitation. No evidence of mitral stenosis.   4. The aortic valve is tricuspid. There is mild thickening of the aortic  valve. Aortic valve regurgitation is mild. Aortic valve sclerosis is  present, with no evidence of aortic valve stenosis.   5. The inferior vena cava is normal in size with greater than 50%  respiratory variability, suggesting right atrial pressure of 3 mmHg.   Comparison(s): Compared to prior TTE in 02/2021, the LVEF has improved  from 40-45% to 60-65%.    Cath 06/17/2017: There is mild to moderate left ventricular systolic dysfunction. LV end diastolic pressure is moderately elevated. The left ventricular ejection fraction is 35-45%  by visual estimate. Hemodynamic findings consistent with moderate pulmonary hypertension.   1. Normal coronary anatomy 2. Mild to moderate LV dysfunction. EF estimated at 40-45% 3. Moderate pulmonary HTN 4. Moderately elevated LV filling pressures. 5. Reduced cardiac output.   Plan:  medical management of CHF. May need more intensive diuretic therapy.    Zannie Cove [facility] Short Stay [facility]/Anesthesiology Phone [phone] 08/08/2023 9:33 AM

## 2023-08-08 NOTE — Anesthesia Preprocedure Evaluation (Signed)
Anesthesia Evaluation  Patient identified by MRN, date of birth, ID band Patient awake    Reviewed: Allergy & Precautions, NPO status , Patient's Chart, lab work & pertinent test results, reviewed documented beta blocker date and time   Airway Mallampati: III  TM Distance: >3 FB Neck ROM: Full    Dental  (+) Edentulous Upper   Pulmonary asthma (used albuterol this AM) , sleep apnea (no cpap) , former smoker Follows with pulmonology for hx of OHS/restriction, nocturnal hypoxemia on 2L O2 around the clock. Severe persistent asthma, maintained on Fasenra.    Pulmonary exam normal breath sounds clear to auscultation       Cardiovascular hypertension (153/70 preop, per pt normally 150-160 SBP), Pt. on medications and Pt. on home beta blockers + angina (last week last used NTG) with exertion and at rest Normal cardiovascular exam+ Valvular Problems/Murmurs (mild AI) AI  Rhythm:Regular Rate:Normal  follows with cardiology for hx of Chronic noncardiac chest pain, NICM with recovered EF, HTN. Normal cath 2018. Pt reports she uses NTG ~ 3x per week and per notes cardiology is aware of this. When she was seen by Jari Favre, PA-C on 03/04/23 she reported exertional chest discomfort. Prior CT in 10/2022 showed no significant CAD. Her imdur was increased to 90mg  daily. Stress test was ordered for surgical clearance. Stress test 03/31/23 was low risk, no change from prior study in 2022. Subsequently cleared per telephone encounter 04/14/23, "Chart reviewed as part of pre-operative protocol coverage. Patient was contacted 04/14/2023 in reference to pre-operative risk assessment for pending surgery as outlined below.  Kelisha Dall was last seen on 03/04/2023 by Jari Favre, PA.  She reported new chest pain, Imdur was increased, Lexiscan Myoview was negative for ischemia. Since that day, Lilliann Rossetti has been stable from a cardiac standpoint.  She denies any new  symptoms or concerns. Therefore, based on ACC/AHA guidelines, the patient would be at acceptable risk for the planned procedure without further cardiovascular testing."   Nuclear stress 03/31/23:   Findings are consistent with no ischemia. The study is low risk.   No ST deviation was noted.   LV perfusion is normal.   Left ventricular function is normal. Nuclear stress EF: 59%. The left ventricular ejection fraction is normal (55-65%). End diastolic cavity size is normal. End systolic cavity size is normal.   Prior study available for comparison from 09/08/2020. No changes compared to prior study. Normal perfusion. LVEF 52% with normal wall motion, low risk study  No reversible ischemia. Apical thinning artifact, worse at rest than stress. LVEF 59% with normal wall motion. This is a low risk study. No change compared to prior study in 2022.  Coronary CTA 10/28/22: IMPRESSION: 1. Calcium score 0  2.  Normal right dominant coronary arteries  3.  Normal ascending thoracic aorta 3.5 cm  TTE 07/03/23: 1. Left ventricular ejection fraction, by estimation, is 60 to 65%. The  left ventricle has normal function. The left ventricle has no regional  wall motion abnormalities. There is mild concentric left ventricular  hypertrophy. Left ventricular diastolic  parameters are consistent with Grade I diastolic dysfunction (impaired  relaxation).  2. Right ventricular systolic function is normal. The right ventricular  size is normal. There is normal pulmonary artery systolic pressure.  3. The mitral valve is grossly normal. Trivial mitral valve  regurgitation. No evidence of mitral stenosis.  4. The aortic valve is tricuspid. There is mild thickening of the aortic  valve. Aortic valve regurgitation is mild.  Aortic valve sclerosis is  present, with no evidence of aortic valve stenosis.  5. The inferior vena cava is normal in size with greater than 50%  respiratory variability,  suggesting right atrial pressure of 3 mmHg.   Comparison(s): Compared to prior TTE in 02/2021, the LVEF has improved  from 40-45% to 60-65%.   Cath 06/17/2017:  There is mild to moderate left ventricular systolic dysfunction.  LV end diastolic pressure is moderately elevated.  The left ventricular ejection fraction is 35-45% by visual estimate.  Hemodynamic findings consistent with moderate pulmonary hypertension.   1. Normal coronary anatomy 2. Mild to moderate LV dysfunction. EF estimated at 40-45% 3. Moderate pulmonary HTN 4. Moderately elevated LV filling pressures. 5. Reduced cardiac output.  Plan: medical management of CHF. May need more intensive diuretic therapy.      Neuro/Psych  Headaches PSYCHIATRIC DISORDERS Anxiety        GI/Hepatic Neg liver ROS,GERD  Controlled and Medicated,,  Endo/Other  Obesity BMI 36  Renal/GU negative Renal ROS  negative genitourinary   Musculoskeletal  (+)  Fibromyalgia -  Abdominal  (+) + obese  Peds  Hematology negative hematology ROS (+)   Anesthesia Other Findings   Reproductive/Obstetrics negative OB ROS                             Anesthesia Physical Anesthesia Plan  ASA: 4  Anesthesia Plan: General   Post-op Pain Management: Tylenol PO (pre-op)*   Induction: Intravenous  PONV Risk Score and Plan: 3 and Ondansetron, Dexamethasone, Midazolam and Treatment may vary due to age or medical condition  Airway Management Planned: LMA  Additional Equipment: None  Intra-op Plan:   Post-operative Plan: Extubation in OR  Informed Consent: I have reviewed the patients History and Physical, chart, labs and discussed the procedure including the risks, benefits and alternatives for the proposed anesthesia with the patient or authorized representative who has indicated his/her understanding and acceptance.     Dental advisory given  Plan Discussed with: CRNA  Anesthesia Plan Comments:  (Underwent R carpal tunnel release 05/26/23 w/LMA without complication.      )        Anesthesia Quick Evaluation

## 2023-08-08 NOTE — Progress Notes (Signed)
SDW call  Patient was given pre-op instructions over the phone. Patient verbalized understanding of instructions provided.  Patient sounded very irritated at the pre-op questions and cut this nurse off re: medications to take/stop for surgery.  Didn't understand why we were asking questions about smoking.  Heavy signing noted on the other end of the phone.     PCP - Ladora Daniel, PA-kC Cardiologist - Dr. Charlton Haws Pulmonary:    PPM/ICD - denies Device Orders - na Rep Notified - na   Chest x-ray - na EKG -  03/04/2023 Stress Test -  ECHO - 07/02/2022 Cardiac Cath - 06/17/2017  Sleep Study/sleep apnea/CPAP: denies  Non-diabetic  Blood Thinner Instructions: denies Aspirin Instructions:denies   ERAS Protcol - Clears until 1000   Anesthesia review: Yes. HTN, SOB, asthma, heart murmur, hx heart cath   Patient denies shortness of breath, fever, cough and chest pain over the phone call  Your procedure is scheduled on Tuesday August 09, 2023  Report to [facility] Main Entrance "A" at 1030   A.M., then check in with the Admitting office.  Call this number if you have problems the morning of surgery:  [phone]   If you have any questions prior to your surgery date call [phone]: Open Monday-Friday 8am-4pm If you experience any cold or flu symptoms such as cough, fever, chills, shortness of breath, etc. between now and your scheduled surgery, please notify us at the above number     Remember:  Do not eat after midnight the night before your surgery  You may drink clear liquids until 1000  the morning of your surgery.   Clear liquids allowed are: Water, Non-Citrus Juices (without pulp), Carbonated Beverages, Clear Tea, Black Coffee ONLY (NO MILK, CREAM OR POWDERED CREAMER of any kind), and Gatorade   Take these medicines the morning of surgery with A SIP OF WATER:  Amlodipine, baclofen, clonidine, imdur, metoprolol, singulair, omeprazole, eye drops  As needed: Albuterol,  norco, duoneb, zanaflex, tramadol  As of today, STOP taking any Aspirin (unless otherwise instructed by your surgeon) Aleve, Naproxen, Ibuprofen, Motrin, Advil, Goody's, BC's, all herbal medications, fish oil, and all vitamins.  This includes your diethylpropion

## 2023-08-09 ENCOUNTER — Encounter (HOSPITAL_COMMUNITY): Payer: Self-pay | Admitting: Physician Assistant

## 2023-08-09 ENCOUNTER — Other Ambulatory Visit: Payer: Self-pay | Admitting: Internal Medicine

## 2023-08-09 ENCOUNTER — Other Ambulatory Visit: Payer: Self-pay

## 2023-08-09 ENCOUNTER — Ambulatory Visit (HOSPITAL_COMMUNITY)
Admission: RE | Admit: 2023-08-09 | Discharge: 2023-08-09 | Disposition: A | Discharge status: Home / Self Care | Payer: Medicaid Other | Source: Ambulatory Visit | Attending: Orthopedic Surgery | Admitting: Orthopedic Surgery

## 2023-08-09 ENCOUNTER — Encounter (HOSPITAL_COMMUNITY)
Admission: RE | Disposition: A | Discharge status: Home / Self Care | Payer: Self-pay | Source: Ambulatory Visit | Attending: Orthopedic Surgery

## 2023-08-09 ENCOUNTER — Encounter (HOSPITAL_COMMUNITY): Payer: Self-pay | Admitting: Orthopedic Surgery

## 2023-08-09 DIAGNOSIS — G4736 Sleep related hypoventilation in conditions classified elsewhere: Secondary | ICD-10-CM | POA: Diagnosis not present

## 2023-08-09 DIAGNOSIS — Z79899 Other long term (current) drug therapy: Secondary | ICD-10-CM | POA: Insufficient documentation

## 2023-08-09 DIAGNOSIS — G5602 Carpal tunnel syndrome, left upper limb: Secondary | ICD-10-CM | POA: Insufficient documentation

## 2023-08-09 DIAGNOSIS — Z539 Procedure and treatment not carried out, unspecified reason: Secondary | ICD-10-CM | POA: Diagnosis not present

## 2023-08-09 DIAGNOSIS — J4550 Severe persistent asthma, uncomplicated: Secondary | ICD-10-CM | POA: Diagnosis not present

## 2023-08-09 DIAGNOSIS — I509 Heart failure, unspecified: Secondary | ICD-10-CM | POA: Diagnosis not present

## 2023-08-09 DIAGNOSIS — I351 Nonrheumatic aortic (valve) insufficiency: Secondary | ICD-10-CM | POA: Diagnosis not present

## 2023-08-09 DIAGNOSIS — J4540 Moderate persistent asthma, uncomplicated: Secondary | ICD-10-CM

## 2023-08-09 DIAGNOSIS — R0789 Other chest pain: Secondary | ICD-10-CM | POA: Insufficient documentation

## 2023-08-09 DIAGNOSIS — J4551 Severe persistent asthma with (acute) exacerbation: Secondary | ICD-10-CM

## 2023-08-09 LAB — CBC
HCT: 39.6 % (ref 36.0–46.0)
Hemoglobin: 12.9 g/dL (ref 12.0–15.0)
MCH: 27.4 pg (ref 26.0–34.0)
MCHC: 32.6 g/dL (ref 30.0–36.0)
MCV: 84.1 fL (ref 80.0–100.0)
Platelets: 415 10*3/uL — ABNORMAL HIGH (ref 150–400)
RBC: 4.71 MIL/uL (ref 3.87–5.11)
RDW: 13.2 % (ref 11.5–15.5)
WBC: 19.3 10*3/uL — ABNORMAL HIGH (ref 4.0–10.5)
nRBC: 0 % (ref 0.0–0.2)

## 2023-08-09 LAB — BASIC METABOLIC PANEL
Anion gap: 11 (ref 5–15)
BUN: 13 mg/dL (ref 6–20)
CO2: 25 mmol/L (ref 22–32)
Calcium: 9.7 mg/dL (ref 8.9–10.3)
Chloride: 103 mmol/L (ref 98–111)
Creatinine, Ser: 0.7 mg/dL (ref 0.44–1.00)
GFR, Estimated: >60 mL/min (ref >60)
Glucose, Bld: 142 mg/dL — ABNORMAL HIGH (ref 70–99)
Potassium: 3.3 mmol/L — ABNORMAL LOW (ref 3.5–5.1)
Sodium: 139 mmol/L (ref 135–145)

## 2023-08-09 SURGERY — CARPAL TUNNEL RELEASE
Anesthesia: Choice | Laterality: Left

## 2023-08-09 MED ORDER — METOPROLOL TARTRATE 50 MG PO TABS: 50.0000 mg | ORAL_TABLET | Freq: Once | ORAL | Status: AC

## 2023-08-09 MED ORDER — ORAL CARE MOUTH RINSE: 15.0000 mL | Freq: Once | OROMUCOSAL | Status: AC

## 2023-08-09 MED ORDER — CHLORHEXIDINE GLUCONATE 0.12 % MT SOLN
15.0000 mL | Freq: Once | OROMUCOSAL | Status: AC
  Administered 2023-08-09: 15 mL via OROMUCOSAL
  Filled 2023-08-09: qty 15

## 2023-08-09 MED ORDER — METOPROLOL TARTRATE 50 MG PO TABS
ORAL_TABLET | ORAL | Status: AC
  Administered 2023-08-09: 50 mg via ORAL
  Filled 2023-08-09: qty 1

## 2023-08-09 MED ORDER — ACETAMINOPHEN 500 MG PO TABS
1000.0000 mg | ORAL_TABLET | Freq: Once | ORAL | Status: DC
  Filled 2023-08-09: qty 2

## 2023-08-09 NOTE — Progress Notes (Signed)
 Specialty Pharmacy Refill Coordination Note  Kelly Butler is a 63 y.o. female assessed today regarding refills of clinic administered specialty medication(s) Benralizumab  (FASENRA )   Clinic requested Courier to Provider Office   Delivery date: 09/12/23   Verified address: [facility] [address] [place], [place]   Medication will be filled on 09/09/23.

## 2023-08-09 NOTE — Progress Notes (Signed)
 Pt refusing to wait until 1300 for surgery which is her scheduled time. Pt wants to cancel and reschedule at a later time. Dr. Kuzma, anesthesia and OR desk made aware of cancellation. Pt told that she would have to contact the office to reschedule. PIV removed. Pt d/c to home with home health aide.   Joesph LOISE Stake, RN

## 2023-08-10 ENCOUNTER — Other Ambulatory Visit: Payer: Self-pay

## 2023-08-11 ENCOUNTER — Other Ambulatory Visit: Payer: Self-pay | Admitting: Orthopedic Surgery

## 2023-08-11 ENCOUNTER — Telehealth: Payer: Self-pay | Admitting: Cardiovascular Disease

## 2023-08-11 NOTE — Telephone Encounter (Signed)
   Pre-operative Risk Assessment    Patient Name: Kelly Butler  DOB: 11/20/1960 MRN: [id number]   Date of last office visit: 02/05/23 Date of next office visit: n/a   Request for Surgical Clearance    Procedure:   left carpal tunnel release   Date of Surgery:  Clearance 08/18/23                                Surgeon:  Dr. Franky Curia  Surgeon's Group or Practice Name:  [facility] Phone number:  [phone] Fax number:  [phone]   Type of Clearance Requested:   - Medical    Type of Anesthesia:   Choice   Additional requests/questions:      SignedBarbee DELENA Sharps   08/11/2023, 2:19 PM

## 2023-08-12 NOTE — Telephone Encounter (Signed)
 I will forward back to preop APP to review as appt needed , but we have no open tele appts nor in office as well before surgery.   Procedure is planned 08/18/23.

## 2023-08-12 NOTE — Telephone Encounter (Signed)
 Office called to say surgery has been cancel because of patient A1C. Please advise

## 2023-08-12 NOTE — Telephone Encounter (Signed)
 I s/w Erminio at Dr. Murrell office and she tells me the surgery has been cancelled all together due to pt's A1c is 11.7. Erminio will let us  know when they are ready to reschedule and will fax new request then. Pre op APP has been made aware surgery has been cancelled.

## 2023-08-12 NOTE — Telephone Encounter (Signed)
 Left message for Kelly Butler, surgery scheduler that we just got the request today for surgery 08/18/23. Our preop team has stated that the pt is going to need an appt. We have no available tele appt's available before  as well as our in office schedules are booked as well.

## 2023-08-12 NOTE — Telephone Encounter (Signed)
   Name: Kelly Butler  DOB: 05/10/1961  MRN: [id number]  Primary Cardiologist: Maude Emmer, MD   Preoperative team, please contact this patient and set up a phone call appointment for further preoperative risk assessment. Please obtain consent and complete medication review. Thank you for your help.  I confirm that guidance regarding antiplatelet and oral anticoagulation therapy has been completed and, if necessary, noted below.  None requested.  I also confirmed the patient resides in the state of [place] . As per [facility] Medical Board telemedicine laws, the patient must reside in the state in which the provider is licensed.   Josefa CHRISTELLA Beauvais, NP 08/12/2023, 11:34 AM [facility]

## 2023-08-15 ENCOUNTER — Other Ambulatory Visit: Payer: Self-pay

## 2023-08-15 ENCOUNTER — Telehealth: Payer: Self-pay | Admitting: Cardiovascular Disease

## 2023-08-15 MED ORDER — FASENRA PEN 30 MG/ML ~~LOC~~ SOAJ
SUBCUTANEOUS | 0 refills | Status: DC
  Filled 2023-08-15: qty 1, 56d supply, fill #0

## 2023-08-15 NOTE — Telephone Encounter (Signed)
 Tried to reach pt to schedule tele preop appt but no answer.

## 2023-08-15 NOTE — Telephone Encounter (Signed)
 Refill sent for FASENRA  to [facility]: [phone]   Dose: 30mg  SQ every 8 weeks  Last OV: 10/12/2022 Provider: Dr. Linder Revere  Next OV: 09/06/2022  Routing to scheduling team for follow-up on appt scheduling  Geraldene Kleine, PharmD, MPH, BCPS Clinical Pharmacist (Rheumatology and Pulmonology)

## 2023-08-15 NOTE — Telephone Encounter (Signed)
   Pre-operative Risk Assessment    Patient Name: Kelly Butler  DOB: 1960/08/10 MRN: [id number]      Request for Surgical Clearance    Procedure:   Left Carpal Tunnel Release  Date of Surgery:  Clearance 09/08/23                                 Surgeon:  Dr Twanna Galas Group or Practice Name:  [facility] Phone number:  [phone] Fax number:  [phone]   Type of Clearance Requested:   - Medical    Type of Anesthesia:   Choice   Additional requests/questions:    Jennine Mohair   08/15/2023, 1:14 PM

## 2023-08-15 NOTE — Telephone Encounter (Signed)
 Primary Cardiologist:Peter Stann Earnest, MD   Preoperative team, please contact this patient and set up a phone call appointment for further preoperative risk assessment. Please obtain consent and complete medication review. Thank you for your help.   I confirm that guidance regarding antiplatelet and oral anticoagulation therapy has been completed and, if necessary, noted below (none requested).   I also confirmed the patient resides in the state of [place] . As per [facility] Medical Board telemedicine laws, the patient must reside in the state in which the provider is licensed.   Gerldine Koch, NP-C  08/15/2023, 4:07 PM [address], [address] Office [phone] Fax [phone]

## 2023-08-16 ENCOUNTER — Telehealth: Payer: Self-pay | Admitting: *Deleted

## 2023-08-16 NOTE — Telephone Encounter (Signed)
Pt has been scheduled tele preop 2/266/25. Med rec and consent are done.     Patient Consent for Virtual Visit        Madelon Welsch has provided verbal consent on 08/16/2023 for a virtual visit (video or telephone).   CONSENT FOR VIRTUAL VISIT FOR:  Abundio Miu  By participating in this virtual visit I agree to the following:  I hereby voluntarily request, consent and authorize [facility] and its employed or contracted physicians, physician assistants, nurse practitioners or other licensed health care professionals (the Practitioner), to provide me with telemedicine health care services (the "Services") as deemed necessary by the treating Practitioner. I acknowledge and consent to receive the Services by the Practitioner via telemedicine. I understand that the telemedicine visit will involve communicating with the Practitioner through live audiovisual communication technology and the disclosure of certain medical information by electronic transmission. I acknowledge that I have been given the opportunity to request an in-person assessment or other available alternative prior to the telemedicine visit and am voluntarily participating in the telemedicine visit.  I understand that I have the right to withhold or withdraw my consent to the use of telemedicine in the course of my care at any time, without affecting my right to future care or treatment, and that the Practitioner or I may terminate the telemedicine visit at any time. I understand that I have the right to inspect all information obtained and/or recorded in the course of the telemedicine visit and may receive copies of available information for a reasonable fee.  I understand that some of the potential risks of receiving the Services via telemedicine include:  Delay or interruption in medical evaluation due to technological equipment failure or disruption; Information transmitted may not be sufficient (e.g. poor resolution of  images) to allow for appropriate medical decision making by the Practitioner; and/or  In rare instances, security protocols could fail, causing a breach of personal health information.  Furthermore, I acknowledge that it is my responsibility to provide information about my medical history, conditions and care that is complete and accurate to the best of my ability. I acknowledge that Practitioner's advice, recommendations, and/or decision may be based on factors not within their control, such as incomplete or inaccurate data provided by me or distortions of diagnostic images or specimens that may result from electronic transmissions. I understand that the practice of medicine is not an exact science and that Practitioner makes no warranties or guarantees regarding treatment outcomes. I acknowledge that a copy of this consent can be made available to me via my patient portal [facility] MyChart), or I can request a printed copy by calling the office of [facility].    I understand that my insurance will be billed for this visit.   I have read or had this consent read to me. I understand the contents of this consent, which adequately explains the benefits and risks of the Services being provided via telemedicine.  I have been provided ample opportunity to ask questions regarding this consent and the Services and have had my questions answered to my satisfaction. I give my informed consent for the services to be provided through the use of telemedicine in my medical care

## 2023-08-16 NOTE — Telephone Encounter (Signed)
Pt has been scheduled tele preop 2/266/25. Med rec and consent are done.

## 2023-08-16 NOTE — Telephone Encounter (Signed)
2nd attempt to reach the pt to schedule tele preop appt. No answer and no vm came on.   I will update the surgeon office as FYI.

## 2023-08-31 ENCOUNTER — Telehealth: Payer: Self-pay | Admitting: *Deleted

## 2023-08-31 ENCOUNTER — Ambulatory Visit: Payer: Medicaid Other | Attending: Cardiovascular Disease | Admitting: Nurse Practitioner

## 2023-08-31 DIAGNOSIS — Z01810 Encounter for preprocedural cardiovascular examination: Secondary | ICD-10-CM

## 2023-08-31 NOTE — Telephone Encounter (Signed)
-----   Message from Joylene Grapes sent at 08/31/2023 11:24 AM EST ----- Pt will need to reschedule tele visit.  Multiple attempts to contact today with no answer.  Thank you-EM

## 2023-08-31 NOTE — Progress Notes (Signed)
   Patient Name: Kelly Butler  DOB: 03/07/1961 MRN: [id number]  Primary Cardiologist: Charlton Haws, MD  Chart reviewed as part of pre-operative protocol coverage.  Pt was scheduled for televisit today, 08/31/2023 at 10:20 AM.  Multiple attempts were made to call patient, no answer.  Unable to leave voicemail.  Patient will need to reschedule televisit for surgical clearance, as we were unable to reach her today.   Joylene Grapes, NP 08/31/2023, 11:22 AM

## 2023-08-31 NOTE — Telephone Encounter (Signed)
 I left a vm for surgery scheduler for Dr. Merlyn Lot the pt missed her tele pre op appt. Pt will need to reschedule tele. Left message I no longer have any tele preop appt's in time for her surgery.   I will send this note as FYI to surgeon.

## 2023-08-31 NOTE — Telephone Encounter (Signed)
 August 31, 2023 Me     08/31/23 11:28 AM Note I left a vm for surgery scheduler for Dr. Merlyn Lot the pt missed her tele pre op appt. Pt will need to reschedule tele. Left message I no longer have any tele preop appt's in time for her surgery.    I will send this note as FYI to surgeon.           08/31/23 11:26 AM You attempted to contact BRENDA, DR. Merlyn Lot (Left Message)    Me     08/31/23 11:26 AM Note ----- Message from Joylene Grapes sent at 08/31/2023 11:24 AM EST ----- Pt will need to reschedule tele visit.  Multiple attempts to contact today with no answer.  Thank you-EM

## 2023-09-01 NOTE — Telephone Encounter (Signed)
 Pt has been scheduled to see Rise Paganini, NP, 09/06/23 2:15, clearance will be addressed at that time.    Will route to the requesting surgeon's office to make them aware.
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
## 2023-09-06 ENCOUNTER — Ambulatory Visit: Payer: Medicaid Other | Admitting: Internal Medicine

## 2023-09-06 ENCOUNTER — Ambulatory Visit: Payer: Medicaid Other | Admitting: Emergency Medicine

## 2023-09-06 ENCOUNTER — Encounter: Payer: Self-pay | Admitting: Internal Medicine

## 2023-09-06 VITALS — BP 134/68 | HR 99 | Temp 98.4°F | Ht 60.0 in | Wt 167.6 lb

## 2023-09-06 DIAGNOSIS — Z87891 Personal history of nicotine dependence: Secondary | ICD-10-CM

## 2023-09-06 DIAGNOSIS — G4733 Obstructive sleep apnea (adult) (pediatric): Secondary | ICD-10-CM

## 2023-09-06 DIAGNOSIS — J4540 Moderate persistent asthma, uncomplicated: Secondary | ICD-10-CM

## 2023-09-06 DIAGNOSIS — R400 Somnolence: Secondary | ICD-10-CM | POA: Diagnosis not present

## 2023-09-06 DIAGNOSIS — G4700 Insomnia, unspecified: Secondary | ICD-10-CM

## 2023-09-06 MED ORDER — BUDESONIDE-FORMOTEROL FUMARATE 160-4.5 MCG/ACT IN AERO: INHALATION_SPRAY | RESPIRATORY_TRACT | 6 refills | Status: AC

## 2023-09-06 MED ORDER — ZOLPIDEM TARTRATE 10 MG PO TABS: ORAL_TABLET | ORAL | 1 refills | Status: AC

## 2023-09-06 MED ORDER — ALBUTEROL SULFATE HFA 108 (90 BASE) MCG/ACT IN AERS: INHALATION_SPRAY | RESPIRATORY_TRACT | 12 refills | Status: AC

## 2023-09-06 NOTE — Patient Instructions (Addendum)
 Refills sent for albuterol rescue inhaler and zolpidem  Sent script for Symbicort  I will see if Harrington Challenger schedule can be changed to every 6 weeks.  Order- schedule home sleep test (don't wear your oxygen that night)    dx OSA  Order- schedule PFT  dx asthma moderate persistent uncomplicated

## 2023-09-08 ENCOUNTER — Telehealth: Payer: Self-pay | Admitting: Pharmacist

## 2023-09-08 ENCOUNTER — Ambulatory Visit (HOSPITAL_COMMUNITY): Admit: 2023-09-08 | Payer: Medicaid Other | Admitting: Orthopedic Surgery

## 2023-09-08 ENCOUNTER — Other Ambulatory Visit: Payer: Self-pay

## 2023-09-08 ENCOUNTER — Telehealth: Payer: Self-pay

## 2023-09-08 ENCOUNTER — Other Ambulatory Visit (HOSPITAL_COMMUNITY): Payer: Self-pay

## 2023-09-08 DIAGNOSIS — J4551 Severe persistent asthma with (acute) exacerbation: Secondary | ICD-10-CM

## 2023-09-08 DIAGNOSIS — J4540 Moderate persistent asthma, uncomplicated: Secondary | ICD-10-CM

## 2023-09-08 SURGERY — CARPAL TUNNEL RELEASE
Anesthesia: Choice | Laterality: Left

## 2023-09-08 MED ORDER — FASENRA PEN 30 MG/ML ~~LOC~~ SOAJ
SUBCUTANEOUS | 3 refills | Status: AC
  Filled 2023-09-08: qty 1, 42d supply, fill #0

## 2023-09-08 NOTE — Telephone Encounter (Signed)
 Per Dr. Maple Hudson, patient is having refractory asthma symptoms at Week 6 of Fasenra. Will send rx for Fasenra q 6 weeks  Infusion therapy plan changed to q 6 weeks  Chesley Mires, PharmD, MPH, BCPS, CPP Clinical Pharmacist (Rheumatology and Pulmonology)\

## 2023-09-08 NOTE — Telephone Encounter (Signed)
 Auth Submission: NO AUTH NEEDED Site of care: Site of care: [facility] Payer: [place] Medicaid [employer] Medication & CPT/J Code(s) submitted: Harrington Challenger Pharmacologist) [id number] Route of submission (phone, fax, portal): portal Phone # Fax # Auth type: Buy/Bill PB Units/visits requested: 30mg  every 6 weeks Reference number:  Approval from: 09/08/23 to 07/04/24   Dosing was change, no prior auth is needed per the [facility] prior auth look up tool.

## 2023-09-08 NOTE — Progress Notes (Signed)
 New prescription sent in for every 6 weeks dosing. It will courier to infusion center 3/10 for 3/13 appointment. Retiming next call.

## 2023-09-09 ENCOUNTER — Other Ambulatory Visit: Payer: Self-pay

## 2023-09-12 ENCOUNTER — Ambulatory Visit: Payer: Medicaid Other | Admitting: General Practice

## 2023-09-15 ENCOUNTER — Ambulatory Visit: Payer: Medicaid Other

## 2023-09-15 MED ORDER — BENRALIZUMAB 30 MG/ML ~~LOC~~ SOSY
30.0000 mg | PREFILLED_SYRINGE | Freq: Once | SUBCUTANEOUS | Status: DC
Start: 2023-09-15 — End: 2023-09-22
  Filled 2023-09-15: qty 1

## 2023-09-16 ENCOUNTER — Ambulatory Visit: Admitting: Obstetrics

## 2023-09-22 ENCOUNTER — Encounter

## 2023-09-22 MED ORDER — BENRALIZUMAB 30 MG/ML ~~LOC~~ SOSY: 30.0000 mg | PREFILLED_SYRINGE | Freq: Once | SUBCUTANEOUS | Status: DC

## 2023-09-23 ENCOUNTER — Ambulatory Visit: Admitting: Obstetrics
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
## 2023-09-26 ENCOUNTER — Ambulatory Visit: Attending: Physician Assistant | Admitting: Physician Assistant

## 2023-09-26 DIAGNOSIS — R072 Precordial pain: Secondary | ICD-10-CM

## 2023-09-26 DIAGNOSIS — J4540 Moderate persistent asthma, uncomplicated: Secondary | ICD-10-CM

## 2023-09-26 DIAGNOSIS — I5020 Unspecified systolic (congestive) heart failure: Secondary | ICD-10-CM

## 2023-09-26 DIAGNOSIS — R002 Palpitations: Secondary | ICD-10-CM

## 2023-09-26 DIAGNOSIS — E785 Hyperlipidemia, unspecified: Secondary | ICD-10-CM

## 2023-09-26 DIAGNOSIS — I10 Essential (primary) hypertension: Secondary | ICD-10-CM

## 2023-09-27 ENCOUNTER — Encounter: Payer: Self-pay | Admitting: Internal Medicine

## 2023-09-29 ENCOUNTER — Ambulatory Visit

## 2023-09-29 MED ORDER — BENRALIZUMAB 30 MG/ML ~~LOC~~ SOSY
30.0000 mg | PREFILLED_SYRINGE | Freq: Once | SUBCUTANEOUS | Status: DC
  Filled 2023-09-29: qty 1

## 2023-10-18 ENCOUNTER — Other Ambulatory Visit: Payer: Self-pay

## 2023-11-16 ENCOUNTER — Other Ambulatory Visit: Payer: Self-pay

## 2023-11-22 ENCOUNTER — Other Ambulatory Visit: Payer: Self-pay

## 2023-11-24 ENCOUNTER — Encounter: Payer: Self-pay | Admitting: Internal Medicine

## 2023-11-24 ENCOUNTER — Encounter

## 2023-11-30 ENCOUNTER — Other Ambulatory Visit: Payer: Self-pay

## 2023-12-19 ENCOUNTER — Other Ambulatory Visit (HOSPITAL_COMMUNITY): Payer: Self-pay

## 2023-12-19 ENCOUNTER — Encounter: Payer: Self-pay | Admitting: Pharmacist

## 2023-12-19 NOTE — Telephone Encounter (Signed)
 error

## 2024-01-05 ENCOUNTER — Ambulatory Visit: Admitting: Internal Medicine

## 2024-01-13 ENCOUNTER — Other Ambulatory Visit: Payer: Self-pay

## 2024-02-20 ENCOUNTER — Other Ambulatory Visit: Payer: Self-pay

## 2024-02-20 ENCOUNTER — Other Ambulatory Visit: Payer: Self-pay | Admitting: Pharmacy Technician

## 2024-02-20 NOTE — Progress Notes (Signed)
 Disenrolled; 7/11: Per [place], pt has dose at infusion ctr; has cancelled appts; no appts since attempt on 4/15

## 2024-07-24 ENCOUNTER — Other Ambulatory Visit (HOSPITAL_COMMUNITY): Payer: Self-pay

## 2024-07-27 ENCOUNTER — Other Ambulatory Visit (HOSPITAL_COMMUNITY): Payer: Self-pay | Admitting: Pharmacist

## 2024-07-27 NOTE — Progress Notes (Signed)
 Patient lost to follow-up. Treatment plan for Fasenra  discontinued. She will need to establish with new pulmonologist  Sherry Pennant, PharmD, MPH, BCPS, CPP Clinical Pharmacist

## 2024-07-30 ENCOUNTER — Other Ambulatory Visit (HOSPITAL_COMMUNITY): Payer: Self-pay
